# Patient Record
Sex: Male | Born: 1955 | Race: White | Hispanic: No | Marital: Married | Smoking: Never smoker
Health system: Southern US, Community
[De-identification: ages and names within clinical notes are randomized; demographics above are authoritative.]

## PROBLEM LIST (undated history)

## (undated) DIAGNOSIS — E785 Hyperlipidemia, unspecified: Secondary | ICD-10-CM

## (undated) DIAGNOSIS — H409 Unspecified glaucoma: Secondary | ICD-10-CM

## (undated) DIAGNOSIS — Z5189 Encounter for other specified aftercare: Secondary | ICD-10-CM

## (undated) DIAGNOSIS — Z9889 Other specified postprocedural states: Secondary | ICD-10-CM

## (undated) DIAGNOSIS — I10 Essential (primary) hypertension: Secondary | ICD-10-CM

## (undated) DIAGNOSIS — R112 Nausea with vomiting, unspecified: Secondary | ICD-10-CM

## (undated) DIAGNOSIS — M1990 Unspecified osteoarthritis, unspecified site: Secondary | ICD-10-CM

## (undated) DIAGNOSIS — E119 Type 2 diabetes mellitus without complications: Secondary | ICD-10-CM

## (undated) HISTORY — DX: Other specified postprocedural states: R11.2

## (undated) HISTORY — DX: Unspecified osteoarthritis, unspecified site: M19.90

## (undated) HISTORY — DX: Unspecified glaucoma: H40.9

## (undated) HISTORY — DX: Other specified postprocedural states: Z98.890

## (undated) HISTORY — PX: MANDIBLE FRACTURE SURGERY: SHX706

## (undated) HISTORY — DX: Type 2 diabetes mellitus without complications: E11.9

## (undated) HISTORY — PX: KNEE ARTHROSCOPY W/ ACL RECONSTRUCTION: SHX1858

## (undated) HISTORY — DX: Hyperlipidemia, unspecified: E78.5

## (undated) HISTORY — PX: TONSILECTOMY/ADENOIDECTOMY WITH MYRINGOTOMY: SHX6125

## (undated) HISTORY — PX: FRACTURE SURGERY: SHX138

## (undated) HISTORY — DX: Essential (primary) hypertension: I10

## (undated) HISTORY — DX: Encounter for other specified aftercare: Z51.89

---

## 2002-05-15 ENCOUNTER — Encounter: Payer: Self-pay | Admitting: Oral Surgery

## 2002-05-20 ENCOUNTER — Ambulatory Visit (HOSPITAL_COMMUNITY): Admission: RE | Admit: 2002-05-20 | Discharge: 2002-05-20 | Payer: Self-pay | Admitting: Oral Surgery

## 2002-08-27 ENCOUNTER — Ambulatory Visit (HOSPITAL_COMMUNITY): Admission: RE | Admit: 2002-08-27 | Discharge: 2002-08-27 | Payer: Self-pay | Admitting: Gastroenterology

## 2008-09-25 HISTORY — PX: COLONOSCOPY: SHX174

## 2009-03-22 ENCOUNTER — Ambulatory Visit: Payer: Self-pay | Admitting: Gastroenterology

## 2009-04-06 ENCOUNTER — Encounter: Payer: Self-pay | Admitting: Gastroenterology

## 2009-04-06 ENCOUNTER — Ambulatory Visit: Payer: Self-pay | Admitting: Gastroenterology

## 2009-04-08 ENCOUNTER — Encounter: Payer: Self-pay | Admitting: Gastroenterology

## 2011-02-10 NOTE — Op Note (Signed)
   NAME:  Chad Morton, Chad Morton                           ACCOUNT NO.:  [id number]   MEDICAL RECORD NO.:  [id number]                   PATIENT TYPE:  AMB   LOCATION:  ENDO                                 FACILITY:  [facility]   PHYSICIAN:  Anselmo Rod, M.D.               DATE OF BIRTH:  [birth year]   DATE OF PROCEDURE:  08/27/2002  DATE OF DISCHARGE:                                 OPERATIVE REPORT   PROCEDURE PERFORMED:  Colonoscopy.   ENDOSCOPIST:  Charna Elizabeth, M.D.   INSTRUMENT USED:  Olympus video colonoscope.   INDICATIONS FOR PROCEDURE:  Family history of colon cancer in a 55 year old  white male.  Rule out colonic polyps, masses, etc.   PREPROCEDURE PREPARATION:  Informed consent was procured from the patient.  The patient was fasted for eight hours prior to the procedure and prepped  with a bottle of magnesium citrate and a gallon of NuLytely the night prior  to the procedure.   PREPROCEDURE PHYSICAL:  The patient had stable vital signs.  Neck supple.  Chest clear to auscultation.  S1 and S2 regular.  Abdomen soft with normal  bowel sounds.   DESCRIPTION OF PROCEDURE:  The patient was placed in left lateral decubitus  position and sedated with 100 mg of Demerol and 10  mg of Versed  intravenously.  Once the patient was adequately sedated and maintained on  low flow oxygen and continuous cardiac monitoring, the Olympus video  colonoscope was advanced from the rectum to the cecum with slight  difficulty.  The patient had significant spasm in the colon.  There was  evidence of early left-sided diverticulosis.  No masses or polyps were seen.  The procedure was complete up to the cecum.  The appendicular orifice and  ileocecal valve were clearly visualized and photographed.   IMPRESSION:  1. Healthy-appearing colon except for left-sided diverticulosis.  2. No masses or polyps seen.   RECOMMENDATIONS:  1. A high fiber diet has been discussed with the patient in great detail and   brochures have been given to him for his     education.  Information on diverticulosis has been handed to him as well.  2. Repeat colorectal cancer screening is recommended in the next five years     unless the patient develops any abnormal symptoms in the interim.                                                  Anselmo Rod, M.D.    JNM/MEDQ  D:  08/27/2002  T:  08/27/2002  Job:  [id number]   cc:   Marjory Lies, M.D.  [address]  [place]  [place] [address]  Fax: [phone]

## 2011-02-10 NOTE — Op Note (Signed)
NAME:  Chad Morton, Chad Morton                           ACCOUNT NO.:  [id number]   MEDICAL RECORD NO.:  [id number]                   PATIENT TYPE:  OIB   LOCATION:  2899                                 FACILITY:  [facility]   PHYSICIAN:  Dionne Ano. Gwyneth Sprout., D.D.S.        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   PROCEDURE:  Bilateral sagittal split osteotomy of the mandible with  advancement and rigid fixation.   SURGEONS:  Dionne Ano. Manson Passey, D.D.S., and Lovena Le, D.D.S.   COMPLICATIONS:  None.   INDICATIONS:  This 55 year old male has completed preoperative orthodontic  treatment with Dr. Althea Grimmer to align the maxillary and mandibular teeth.  After a model analysis, cephalometric analysis, and clinical analysis, it  was noted that he was significantly mandibular-deficient with a full class  II malocclusion, and this malocclusion could not be corrected with  orthodontic treatment alone.  A treatment plan was formulated for advancing  his mandible hiatus 5 mm on the right side and 6 mm on the left side to  achieve a class I occlusion, and a surgical splint was constructed from the  surgical models.   DESCRIPTION OF PROCEDURE:  The patient brought to the operating suite,  placed in the supine position on the operating table.  After satisfactory  nasotracheal anesthesia was achieved, a moist sponge was placed in the  oropharynx and the mouth was prepped with Betadine scrub and the face was  painted with Betadine.  The patient was draped with four towels, split  sheet, and a head sheet.   Using 0.5% Marcaine with 1:200,000 epinephrine, bilateral inferior alveolar  and lingual nerve blocks were achieved for hemostasis and  postoperative  comfort.  Then using an electrocautery in the cutting mode, an incision was  made along the anterior ramus of the right ramus of the mandible from the  midportion anteriorly and inferiorly to the second molar tooth area.  The  incision was carried through the mucosa, submucosa, buccinator muscle,  temporalis muscle, periosteum, down to bone.  A subperiosteal dissection was  carried out along the anterior border of the mandible and the temporalis  attachment was stripped superiorly to the coronoid process.  Dissection as  carried out subperiosteally on the medial aspect of the mandible,  identifying the lingula, and then using the rotary osteotome a horizontal  bone cut was made just superior to the lingula through the medial cortical  plate and the internal oblique ridge.  Then using a 701 bur, a bone cut was  made in the sagittal plane between the external and internal oblique ridges  anteriorly and inferiorly to the second molar tooth area.  The dissection  was carried out from the inferior border and a retractor was placed along  the inferior border of the mandible, and a bone cut was made from the  inferior border to the lateral cortical plates to connect with the inferior  portion of the sagittal cut.  Then using a mallet and chisel, taking great  care to prevent injury to the inferior alveolar nerve, the sagittal plane  was dissected between the external and internal oblique ridges with  progressively larger chisel to start the sagittal split and then using  splitting instruments, the sagittal split was completed in the traditional  manner.  The inferior alveolar neurovascular bundle was noted to be  contained completely in the distal segment.  A J-stripper was used to strip  portions of the digastric muscle from the inferior border and the medial  pterygoid muscle internally, thus bringing up the mandible for advancement.  A half pack was placed, and a similar sagittal  split osteotomy was completed  on the left ramus of the mandible with the inferior alveolar neurovascular  bundle intact and contained completely in the distal segment after  dissection from the proximal segment.   The mandible was now freely movable and could be advanced 6 mm on the left  and 5 mm on the right and a surgical splint guide was placed intra-  occlusally, and the patient was interdigitated into the occlusal splint and  the teeth were placed in the intermaxillary fixation.  With the teeth in  their new advanced position, the proximal segments of the mandible were then  positioned with the condyle in the most posterior superior portion of the  glenoid fossa and clamped to the distal segment, and two KLS titanium 2 mm  diameter screws were drilled in a noncompression manner for rigid fixation  through the ramus of the mandible on each side.  This fixated the proximal  and distal segments with the condyles in their proper anatomic position and  the mandible in a class I position.  This was tested by releasing the  intermaxillary fixation, and the mandible could be rotated easily into the  new class I occlusion and was exactly as planned on the surgical models.   Both incision areas were then thoroughly irrigated with normal saline and  closed with a running 3-0 Vicryl suture.   The oral cavity was thoroughly irrigated.  The throat pack was removed, and  the patient was placed in light intermaxillary elastic fixation and awakened  in the operating room.   The patient tolerated the surgery and the anesthesia without complications  with an estimated blood loss of 20 cc.                                                 Dionne Ano. Gwyneth Sprout., D.D.S.    WMB/MEDQ  D:  05/20/2002  T:  05/22/2002  Job:  [id number]

## 2014-04-07 ENCOUNTER — Encounter: Payer: Self-pay | Admitting: Gastroenterology

## 2015-10-08 MED FILL — GLIMEPIRIDE 4 MG TABLET: 4 | 90 days supply | Qty: 180 | Fill #1

## 2015-12-10 MED FILL — PRAVASTATIN NA 40 MG TAB: 40 | 90 days supply | Qty: 90 | Fill #0

## 2015-12-10 MED FILL — BENAZEPRIL HCL 40 MG TABLET: 40 | 90 days supply | Qty: 90 | Fill #0

## 2015-12-10 MED FILL — METFORMIN HCL ER 500 MG TAB: 500 | 90 days supply | Qty: 360 | Fill #0

## 2016-05-26 DIAGNOSIS — E119 Type 2 diabetes mellitus without complications: Secondary | ICD-10-CM | POA: Diagnosis not present

## 2016-05-26 DIAGNOSIS — R030 Elevated blood-pressure reading, without diagnosis of hypertension: Secondary | ICD-10-CM | POA: Diagnosis not present

## 2016-05-26 DIAGNOSIS — E7800 Pure hypercholesterolemia, unspecified: Secondary | ICD-10-CM | POA: Diagnosis not present

## 2016-05-30 DIAGNOSIS — E785 Hyperlipidemia, unspecified: Secondary | ICD-10-CM | POA: Diagnosis not present

## 2016-05-30 DIAGNOSIS — E119 Type 2 diabetes mellitus without complications: Secondary | ICD-10-CM | POA: Diagnosis not present

## 2016-05-30 DIAGNOSIS — I10 Essential (primary) hypertension: Secondary | ICD-10-CM | POA: Diagnosis not present

## 2016-05-30 MED FILL — METFORMIN HCL ER 500 MG TAB: 500 | 90 days supply | Qty: 360 | Fill #0

## 2016-05-30 MED FILL — PRAVASTATIN NA 40 MG TAB: 40 | 90 days supply | Qty: 90 | Fill #0

## 2016-05-30 MED FILL — BENAZEPRIL HCL 40 MG TABLET: 40 | 90 days supply | Qty: 90 | Fill #0

## 2016-06-02 MED FILL — GLIMEPIRIDE 4 MG TABLET: 4 | 90 days supply | Qty: 90 | Fill #0

## 2016-08-28 MED FILL — PRAVASTATIN NA 40 MG TAB: 40 | 90 days supply | Qty: 90 | Fill #1

## 2016-08-28 MED FILL — METFORMIN HCL ER 500 MG TAB: 500 | 90 days supply | Qty: 360 | Fill #1

## 2016-08-28 MED FILL — BENAZEPRIL HCL 40 MG TABLET: 40 | 90 days supply | Qty: 90 | Fill #1

## 2016-08-28 MED FILL — GLIMEPIRIDE 4 MG TABLET: 4 | 90 days supply | Qty: 90 | Fill #1

## 2016-11-24 DIAGNOSIS — E119 Type 2 diabetes mellitus without complications: Secondary | ICD-10-CM | POA: Diagnosis not present

## 2016-11-24 DIAGNOSIS — Z125 Encounter for screening for malignant neoplasm of prostate: Secondary | ICD-10-CM | POA: Diagnosis not present

## 2016-11-24 DIAGNOSIS — I10 Essential (primary) hypertension: Secondary | ICD-10-CM | POA: Diagnosis not present

## 2016-11-24 DIAGNOSIS — E785 Hyperlipidemia, unspecified: Secondary | ICD-10-CM | POA: Diagnosis not present

## 2016-11-29 DIAGNOSIS — E119 Type 2 diabetes mellitus without complications: Secondary | ICD-10-CM | POA: Diagnosis not present

## 2016-11-29 DIAGNOSIS — E785 Hyperlipidemia, unspecified: Secondary | ICD-10-CM | POA: Diagnosis not present

## 2016-11-29 DIAGNOSIS — I10 Essential (primary) hypertension: Secondary | ICD-10-CM | POA: Diagnosis not present

## 2016-11-29 MED FILL — METFORMIN HCL ER 500 MG TAB: 500 | 90 days supply | Qty: 360 | Fill #0

## 2016-11-29 MED FILL — GLIMEPIRIDE 4 MG TABLET: 4 | 90 days supply | Qty: 90 | Fill #0

## 2016-11-29 MED FILL — PRAVASTATIN NA 40 MG TAB: 40 | 90 days supply | Qty: 90 | Fill #0

## 2016-11-29 MED FILL — BENAZEPRIL HCL 40 MG TABLET: 40 | 90 days supply | Qty: 90 | Fill #0

## 2017-01-18 DIAGNOSIS — E119 Type 2 diabetes mellitus without complications: Secondary | ICD-10-CM | POA: Diagnosis not present

## 2017-01-18 DIAGNOSIS — H401131 Primary open-angle glaucoma, bilateral, mild stage: Secondary | ICD-10-CM | POA: Diagnosis not present

## 2017-01-18 MED FILL — LATANOPROST 0.005% EYE DRP: 0.005 | 75 days supply | Qty: 8 | Fill #0

## 2017-04-24 MED FILL — BENAZEPRIL HCL 40 MG TABLET: 40 | 90 days supply | Qty: 90 | Fill #1

## 2017-04-24 MED FILL — PRAVASTATIN NA 40 MG TAB: 40 | 90 days supply | Qty: 90 | Fill #1

## 2017-04-24 MED FILL — GLIMEPIRIDE 4 MG TABLET: 4 | 90 days supply | Qty: 90 | Fill #1

## 2017-04-25 MED FILL — LATANOPROST 0.005% EYE DRP: 0.005 | 75 days supply | Qty: 8 | Fill #1

## 2017-05-30 DIAGNOSIS — R030 Elevated blood-pressure reading, without diagnosis of hypertension: Secondary | ICD-10-CM | POA: Insufficient documentation

## 2017-05-30 DIAGNOSIS — E7800 Pure hypercholesterolemia, unspecified: Secondary | ICD-10-CM | POA: Insufficient documentation

## 2017-05-30 DIAGNOSIS — E119 Type 2 diabetes mellitus without complications: Secondary | ICD-10-CM | POA: Insufficient documentation

## 2017-07-19 DIAGNOSIS — H401132 Primary open-angle glaucoma, bilateral, moderate stage: Secondary | ICD-10-CM | POA: Diagnosis not present

## 2017-07-19 MED FILL — COMBIGAN EYE DROPS: 0.2-0.5 | 30 days supply | Qty: 5 | Fill #0

## 2017-07-25 MED FILL — BENAZEPRIL HCL 40 MG TABLET: 40 | 90 days supply | Qty: 90 | Fill #0

## 2017-07-30 DIAGNOSIS — E785 Hyperlipidemia, unspecified: Secondary | ICD-10-CM | POA: Diagnosis not present

## 2017-07-30 DIAGNOSIS — E1169 Type 2 diabetes mellitus with other specified complication: Secondary | ICD-10-CM | POA: Diagnosis not present

## 2017-07-30 DIAGNOSIS — I10 Essential (primary) hypertension: Secondary | ICD-10-CM | POA: Diagnosis not present

## 2017-07-30 LAB — LIPID PANEL
CHOLESTEROL: 163 (ref 0–200)
HDL: 65 (ref 35–70)
LDL CALC: 98
Triglycerides: 102 (ref 40–160)

## 2017-07-30 LAB — HEMOGLOBIN A1C: Hemoglobin A1C: 7.1

## 2017-08-01 DIAGNOSIS — E119 Type 2 diabetes mellitus without complications: Secondary | ICD-10-CM | POA: Diagnosis not present

## 2017-08-01 DIAGNOSIS — E7800 Pure hypercholesterolemia, unspecified: Secondary | ICD-10-CM | POA: Diagnosis not present

## 2017-08-01 DIAGNOSIS — I10 Essential (primary) hypertension: Secondary | ICD-10-CM | POA: Diagnosis not present

## 2017-08-01 MED FILL — METFORMIN HCL ER 500 MG TAB: 500 | 90 days supply | Qty: 360 | Fill #0

## 2017-08-01 MED FILL — GLIMEPIRIDE 4 MG TABLET: 4 | 90 days supply | Qty: 135 | Fill #0

## 2017-08-01 MED FILL — PRAVASTATIN NA 40 MG TAB: 40 | 90 days supply | Qty: 90 | Fill #0

## 2017-08-20 DIAGNOSIS — H401132 Primary open-angle glaucoma, bilateral, moderate stage: Secondary | ICD-10-CM | POA: Diagnosis not present

## 2017-09-14 MED FILL — COMBIGAN EYE DROPS: 0.2-0.5 | 30 days supply | Qty: 5 | Fill #1

## 2017-10-09 ENCOUNTER — Ambulatory Visit (INDEPENDENT_AMBULATORY_CARE_PROVIDER_SITE_OTHER): Payer: No Typology Code available for payment source | Admitting: Family Medicine

## 2017-10-09 ENCOUNTER — Encounter: Payer: Self-pay | Admitting: Family Medicine

## 2017-10-09 VITALS — BP 130/88 | HR 79 | Temp 98.1°F | Ht 68.5 in | Wt 197.4 lb

## 2017-10-09 DIAGNOSIS — E1169 Type 2 diabetes mellitus with other specified complication: Secondary | ICD-10-CM | POA: Diagnosis not present

## 2017-10-09 DIAGNOSIS — E119 Type 2 diabetes mellitus without complications: Secondary | ICD-10-CM | POA: Diagnosis not present

## 2017-10-09 DIAGNOSIS — Z23 Encounter for immunization: Secondary | ICD-10-CM | POA: Diagnosis not present

## 2017-10-09 DIAGNOSIS — Z800 Family history of malignant neoplasm of digestive organs: Secondary | ICD-10-CM | POA: Diagnosis not present

## 2017-10-09 DIAGNOSIS — H409 Unspecified glaucoma: Secondary | ICD-10-CM | POA: Diagnosis not present

## 2017-10-09 DIAGNOSIS — I10 Essential (primary) hypertension: Secondary | ICD-10-CM | POA: Diagnosis not present

## 2017-10-09 DIAGNOSIS — T50A95A Adverse effect of other bacterial vaccines, initial encounter: Secondary | ICD-10-CM | POA: Diagnosis not present

## 2017-10-09 DIAGNOSIS — E785 Hyperlipidemia, unspecified: Secondary | ICD-10-CM

## 2017-10-09 NOTE — Patient Instructions (Addendum)
We will call you within a week or two about your referral to GI for colonoscopy due to family hisotry. If you do not hear within 3 weeks, give us a call.   Sign release of information at the check out desk for records from your eye doctor  Pneumovax 23 today- will repeat in 5 years, in 4 years likely give prevnar 13- 3 total vaccines for pneumonia in your lifetime unless guidelines change  I like your idea of trimming 10-15 lbs. Blood pressure is high normal and on bottom # and would be nice to see that come down some.   No changes today othrewise

## 2017-10-09 NOTE — Addendum Note (Signed)
Addended by: Heide SparkSOUTHERN HIZER, JAMIE M on: 10/09/2017 10:36 AM   Modules accepted: Orders

## 2017-10-09 NOTE — Assessment & Plan Note (Signed)
S: mild poorly controlled with last a1c 7.1 in November 2018. On amaryl 6mg  daily (up from 4mg  when a1c was 7.1) plus metformin 500mg  XR 2 tablets once a day (instructions from [facility] were 2 tablets twice a day but he is only taking once a day) A/P: continue current meds- return in 5 months- get a1c, cmp likely.

## 2017-10-09 NOTE — Progress Notes (Addendum)
Phone: [phone]  Subjective:  Patient presents today to establish care.  Prior patient of Rueben Bash, [place] with [facility] (on epic- last seen 08/01/17). Chief complaint-noted.   See problem oriented charting  The following were reviewed and entered/updated in epic: Past Medical History:  Diagnosis Date  . Diabetes mellitus without complication (HCC)   . Glaucoma    [facility]  . Hyperlipidemia   . Hypertension    Patient Active Problem List   Diagnosis Date Noted  . 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 Hypertension, essential 10/09/2017    Priority: Medium  . Hyperlipidemia associated with type 2 diabetes mellitus (HCC) 10/09/2017    Priority: Medium  . Glaucoma     Priority: Medium  . Family history of colon cancer 10/09/2017    Priority: Low  . Diabetes mellitus type II, controlled (HCC) 10/09/2017   Past Surgical History:  Procedure Laterality Date  . KNEE ARTHROSCOPY W/ ACL RECONSTRUCTION Bilateral    both knees x1  . MANDIBLE FRACTURE SURGERY Bilateral   . TONSILECTOMY/ADENOIDECTOMY WITH MYRINGOTOMY     childhood    Family History  Problem Relation Age of Onset  . Myasthenia gravis Mother        complications from this  . Colon cancer Father        diagnosed 41- died within a year of diagnosis  . Diabetes Brother   . Hyperlipidemia Brother     Medications- reviewed and updated Current Outpatient Medications  Medication Sig Dispense Refill  . aspirin EC 81 MG tablet Take 1 tablet by mouth daily.    . benazepril (LOTENSIN) 40 MG tablet Take 1 tablet by mouth daily.    . COMBIGAN 0.2-0.5 % ophthalmic solution Place 1 drop into both eyes 2 (two) times daily.  3  . glimepiride (AMARYL) 4 MG tablet Take 1.5 tablets by mouth daily.    . metFORMIN (GLUCOPHAGE-XR) 500 MG 24 hr tablet Take 2 tablets by mouth daily.    . pravastatin (PRAVACHOL) 40 MG tablet Take 1 tablet by mouth daily.     No  current facility-administered medications for this visit.     Allergies-reviewed and updated Allergies  Allergen Reactions  . Diphtheria Toxoid Swelling    Social History   Socioeconomic History  . Marital status: Married    Spouse name: None  . Number of children: None  . Years of education: None  . Highest education level: None  Social Needs  . Financial resource strain: None  . Food insecurity - worry: None  . Food insecurity - inability: None  . Transportation needs - medical: None  . Transportation needs - non-medical: None  Occupational History  . None  Tobacco Use  . Smoking status: Never Smoker  . Smokeless tobacco: Never Used  Substance and Sexual Activity  . Alcohol use: Yes    Alcohol/week: 8.4 oz    Types: 14 Standard drinks or equivalent per week  . Drug use: No  . Sexual activity: Yes  Other Topics Concern  . None  Social History Narrative   Married. 3 kids. 4 soon to be 5 grandkids (first girl) in 2018.       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 Daughter works for [employer]- geography major      Hobbies: time with family, beach, football (season tickets to [employer])    ROS--Full ROS was completed Review of Systems  Constitutional: Negative for chills and  fever.  HENT: Negative for ear discharge and ear pain.   Eyes: Negative for blurred vision and double vision.  Respiratory: Negative for cough and shortness of breath.   Cardiovascular: Negative for chest pain and palpitations.  Gastrointestinal: Negative for abdominal pain and vomiting.  Genitourinary: Negative for dysuria and urgency.  Musculoskeletal: Negative for myalgias and neck pain.  Skin: Negative for itching and rash.  Neurological: Negative for dizziness and tingling.  Endo/Heme/Allergies: Negative for polydipsia. Does not bruise/bleed easily.  Psychiatric/Behavioral: Negative for hallucinations and substance abuse.   Objective: BP 130/88   Pulse 79   Temp 98.1 F  (36.7 C) (Oral)   Ht 5' 8.5" (1.74 m)   Wt 197 lb 6.4 oz (89.5 kg)   SpO2 98%   BMI 29.58 kg/m  Gen: NAD, resting comfortably HEENT: Mucous membranes are moist. Oropharynx normal. TM normal. Eyes: sclera and lids normal, PERRLA Neck: no thyromegaly, no cervical lymphadenopathy CV: RRR no murmurs rubs or gallops Lungs: CTAB no crackles, wheeze, rhonchi Abdomen: soft/nontender/nondistended/normal bowel sounds. No rebound or guarding. overweight Ext: no edema Skin: warm, dry Neuro: 5/5 strength in upper and lower extremities, normal gait, normal reflexes  Diabetic Foot Exam - Simple   Simple Foot Form Diabetic Foot exam was performed with the following findings:  Yes 10/09/2017 10:14 AM  Visual Inspection No deformities, no ulcerations, no other skin breakdown bilaterally:  Yes Sensation Testing Intact to touch and monofilament testing bilaterally:  Yes Pulse Check Posterior Tibialis and Dorsalis pulse intact bilaterally:  Yes Comments    Assessment/Plan:  Health Maintenance Due  . Hepatitis C Screening - donated blood to [facility] within 10 years Aug 14, 1956  . PNEUMOCOCCAL POLYSACCHARIDE VACCINE (1)- today 04/18/1958  . FOOT EXAM - today 04/18/1966  . OPHTHALMOLOGY EXAM - request today 04/18/1966  . HIV Screening - donated blood to [facility] within 10 years 04/19/1971  . TETANUS/TDAP - not a candidate due to diphtheria reaction in the past 04/19/1975   See avs as well  Hypertension, essential S: controlled on benazepril 40mg .  BP Readings from Last 3 Encounters:  10/09/17 130/88  A/P: We discussed blood pressure goal of <140/90. Continue current meds- will refill when needed- he asked us to not send any meds in today as just had them filled  Hyperlipidemia associated with type 2 diabetes mellitus (HCC) S:  controlled on pravastatin 40mg  with LDL 98. Total was 163, trig 102, HDL 65 on 07/30/17- will abstract in. No myalgias.   A/P: well controlled- refill medicine under  my name when needed  Diabetes mellitus type II, controlled (HCC) S: mild poorly controlled with last a1c 7.1 in November 2018. On amaryl 6mg  daily (up from 4mg  when a1c was 7.1) plus metformin 500mg  XR 2 tablets once a day (instructions from [facility] were 2 tablets twice a day but he is only taking once a day) A/P: continue current meds- return in 5 months- get a1c, cmp likely.   Return in about 5 months (around 03/09/2018) for follow up- or sooner if needed.   Need for pneumococcal vaccine - Plan: Pneumococcal polysaccharide vaccine 23-valent greater than or equal to 2yo subcutaneous/IM   Return precautions advised. Tana ConchStephen Brailon Don, MD

## 2017-10-09 NOTE — Assessment & Plan Note (Signed)
S: controlled on benazepril 40mg .  BP Readings from Last 3 Encounters:  10/09/17 130/88  A/P: We discussed blood pressure goal of <140/90. Continue current meds- will refill when needed- he asked us to not send any meds in today as just had them filled

## 2017-10-09 NOTE — Assessment & Plan Note (Signed)
S:  controlled on pravastatin 40mg  with LDL 98. Total was 163, trig 102, HDL 65 on 07/30/17- will abstract in. No myalgias.   A/P: well controlled- refill medicine under my name when needed

## 2017-10-22 MED FILL — BENAZEPRIL HCL 40 MG TABLET: 40 | 90 days supply | Qty: 90 | Fill #1

## 2017-10-23 MED FILL — COMBIGAN EYE DROPS: 0.2-0.5 | 30 days supply | Qty: 5 | Fill #2

## 2017-10-25 MED FILL — METFORMIN HCL ER 500 MG TAB: 500 | 90 days supply | Qty: 360 | Fill #1

## 2017-10-25 MED FILL — PRAVASTATIN NA 40 MG TAB: 40 | 90 days supply | Qty: 90 | Fill #1

## 2017-10-25 MED FILL — GLIMEPIRIDE 4 MG TABLET: 4 | 90 days supply | Qty: 135 | Fill #1

## 2017-11-09 ENCOUNTER — Encounter: Payer: Self-pay | Admitting: Gastroenterology

## 2017-11-15 ENCOUNTER — Telehealth: Payer: Self-pay | Admitting: Family Medicine

## 2017-11-15 NOTE — Telephone Encounter (Signed)
See note and please reach out to patient

## 2017-11-15 NOTE — Telephone Encounter (Signed)
Yes thanks- referral to Dr. Berline Choughigby is fine

## 2017-11-15 NOTE — Telephone Encounter (Signed)
Called and spoke with patient and let him know that the referral is in his chart. Gastroenterology will also have a copy of the referral to provide for insurance purposes.  Patient also complains of right knee pain. He was asking about a referral. I discussed the differences between Sports Med & Ortho and he is requesting to see Sports Medicine for Right Knee Pain. Is it ok to place this referral?

## 2017-11-15 NOTE — Telephone Encounter (Signed)
Copied from CRM [id number]. Topic: Quick Communication - See Telephone Encounter >> Nov 15, 2017  1:21 PM Rudi CocoLathan, Kamy Poinsett M, [place] wrote: CRM for notification. See Telephone encounter for:   11/15/17. Pt. Calling to ask about his referral that was sent to Dr. Christella HartiganJacobs dealing with Gi. Pt. York SpanielSaid that someone needs to contact his insurance to let them know that Dr. Durene CalHunter referred him to this provider. Pt. Can be reached at [phone]

## 2017-11-16 ENCOUNTER — Other Ambulatory Visit: Payer: Self-pay

## 2017-11-16 DIAGNOSIS — M25561 Pain in right knee: Secondary | ICD-10-CM

## 2017-11-16 NOTE — Telephone Encounter (Signed)
Referral placed as requested.

## 2017-11-19 NOTE — Telephone Encounter (Signed)
Pt has been scheduled with Dr. Berline Choughigby

## 2017-11-22 ENCOUNTER — Encounter: Payer: Self-pay | Admitting: Sports Medicine

## 2017-11-22 ENCOUNTER — Ambulatory Visit (INDEPENDENT_AMBULATORY_CARE_PROVIDER_SITE_OTHER): Payer: No Typology Code available for payment source | Admitting: Sports Medicine

## 2017-11-22 ENCOUNTER — Ambulatory Visit: Payer: Self-pay

## 2017-11-22 VITALS — BP 138/88 | HR 82 | Ht 68.5 in | Wt 191.4 lb

## 2017-11-22 DIAGNOSIS — M25511 Pain in right shoulder: Secondary | ICD-10-CM

## 2017-11-22 DIAGNOSIS — E119 Type 2 diabetes mellitus without complications: Secondary | ICD-10-CM

## 2017-11-22 DIAGNOSIS — M25561 Pain in right knee: Secondary | ICD-10-CM | POA: Diagnosis not present

## 2017-11-22 DIAGNOSIS — Z9889 Other specified postprocedural states: Secondary | ICD-10-CM

## 2017-11-22 NOTE — Progress Notes (Signed)
Chad FellsMichael D. Delorise Shinerigby, DO  [facility] [place] [phone]  Waylan BogaGill R Depner - 62 y.o. male MRN [id number]  Date of birth: 02-01-1956  Visit Date: 11/22/2017  PCP: Shelva MajesticHunter, Stephen O, MD   Referred by: Shelva MajesticHunter, Stephen O, MD   Scribe for today's visit: Christoper FabianMolly Weber, LAT, ATC     SUBJECTIVE:  Skeet SimmerGill R Cleckler is here for No chief complaint on file. Marland Kitchen.  Referred by: Dr. Durene CalHunter His R ant-med knee pain symptoms INITIALLY: Began about 3 weeks w/ no known MOI.  Has a hx of B ACL reconstructions. Described as 4/10 moderate pain at rest and 7/10 at it's worst, nonradiating Worsened with weight-bearing Improved with Naproxen Additional associated symptoms include: no reported popping or clicking; moderate swelling per pt report    At this time symptoms are improving compared to onset  He has been taking Naproxen (3 pills/day; 2 in the AM and 1 in PM)   ROS Denies night time disturbances. Denies fevers, chills, or night sweats. Denies unexplained weight loss. Denies personal history of cancer. Denies changes in bowel or bladder habits. Denies recent unreported falls. Denies new or worsening dyspnea or wheezing. Denies headaches or dizziness.  Denies numbness, tingling or weakness  In the extremities.  Denies dizziness or presyncopal episodes Denies lower extremity edema    HISTORY & PERTINENT PRIOR DATA:  Prior History reviewed and updated per electronic medical record.  Significant/pertinent history, findings, studies include:  reports that he has never smoked. He has never used smokeless tobacco. Recent Labs    07/30/17  HGBA1C 7.1   No specialty comments available. No problems updated.  OBJECTIVE:  VS:  HT:5' 8.5" (174 cm)   WT:191 lb 6.4 oz (86.8 kg)  BMI:28.68    BP:138/88  HR:82bpm  TEMP: ( )  RESP:96 %   PHYSICAL EXAM: Constitutional: WDWN, Non-toxic appearing. Psychiatric: Alert & appropriately interactive.  Not depressed or  anxious appearing. Respiratory: No increased work of breathing.  Trachea Midline Eyes: Pupils are equal.  EOM intact without nystagmus.  No scleral icterus  Vascular Exam: warm to touch no edema  lower extremity neuro exam: unremarkable normal strength normal sensation normal reflexes  MSK Exam: Right knee is overall well aligned with small amount of osteoarthritic bossing.  No significant laxity with anterior and posterior drawer.  He does have a slight varus deformity.  Positive patellar grind.  Pain with McMurray's.  Right shoulder has a small degree of scapular dyskinesis but this is minimal.  Overhead range of motion is full.  He has intrinsic rotator cuff strength that is intact.    ASSESSMENT & PLAN:   1. Controlled type 2 diabetes mellitus without complication, without long-term current use of insulin (HCC)   2. Arthralgia of right knee   3. S/P ACL repair   4. Pain in joint of right shoulder     PLAN: Injection performed today.  We will plan to follow-up with him in 10 weeks to ensure clinical resolution and consider repeating injections at that time.  Any lack of improvement with the shoulder we will plan to have him begin on therapeutic exercises emphasize posterior chain exercises for him today.  Follow-up: Return in about 10 weeks (around 01/31/2018).      Please see additional documentation for Objective, Assessment and Plan sections. Pertinent additional documentation may be included in corresponding procedure notes, imaging studies, problem based documentation and patient instructions. Please see these sections of the encounter for  additional information regarding this visit.  CMA/ATC served as Neurosurgeon during this visit. History, Physical, and Plan performed by medical provider. Documentation and orders reviewed and attested to.      Andrena Mews, DO    [facility] Physician

## 2017-11-22 NOTE — Patient Instructions (Signed)

## 2017-11-22 NOTE — Procedures (Signed)
PROCEDURE NOTE:  Ultrasound Guided: Injection: right knee Images were obtained and interpreted by myself, Gaspar BiddingMichael Rigby, DO  Images have been saved and stored to PACS system. Images obtained on: GE S7 Ultrasound machine  ULTRASOUND FINDINGS:  Small amount of generalized synovitis without significant effusion.  DESCRIPTION OF PROCEDURE:  The patient's clinical condition is marked by substantial pain and/or significant functional disability. Other conservative therapy has not provided relief, is contraindicated, or not appropriate. There is a reasonable likelihood that injection will significantly improve the patient's pain and/or functional impairment.  After discussing the risks, benefits and expected outcomes of the injection and all questions were reviewed and answered, the patient wished to undergo the above named procedure. Verbal consent was obtained.  The ultrasound was used to identify the target structure and adjacent neurovascular structures. The skin was then prepped in sterile fashion and the target structure was injected under direct visualization using sterile technique as below:  PREP: Alcohol, Ethel Chloride APPROACH: superiolateral, single injection, 25g 1.5in.  INJECTATE: 3cc: 0.5% marcaine, 1cc: 40mg /mL DepoMedrol   ASPIRATE: None   DRESSING: Band-Aid &: 4-inch Ace Wrap  Post procedural instructions including recommending icing and warning signs for infection were reviewed.   This procedure was well tolerated and there were no complications.   IMPRESSION: Succesful Ultrasound Guided: Injection

## 2017-12-05 ENCOUNTER — Other Ambulatory Visit: Payer: Self-pay

## 2017-12-05 ENCOUNTER — Encounter: Payer: Self-pay | Admitting: Sports Medicine

## 2017-12-05 DIAGNOSIS — M25561 Pain in right knee: Secondary | ICD-10-CM

## 2017-12-05 NOTE — Telephone Encounter (Signed)
Okay to order MRI of the knee.  Rule out meniscal tear.  H

## 2017-12-10 ENCOUNTER — Encounter: Payer: Self-pay | Admitting: Sports Medicine

## 2017-12-10 ENCOUNTER — Ambulatory Visit: Payer: No Typology Code available for payment source | Admitting: Sports Medicine

## 2017-12-10 ENCOUNTER — Ambulatory Visit: Payer: Self-pay

## 2017-12-10 ENCOUNTER — Ambulatory Visit (INDEPENDENT_AMBULATORY_CARE_PROVIDER_SITE_OTHER): Payer: No Typology Code available for payment source

## 2017-12-10 VITALS — BP 130/88 | HR 107 | Ht 68.5 in | Wt 188.4 lb

## 2017-12-10 DIAGNOSIS — M25511 Pain in right shoulder: Secondary | ICD-10-CM | POA: Diagnosis not present

## 2017-12-10 DIAGNOSIS — M75101 Unspecified rotator cuff tear or rupture of right shoulder, not specified as traumatic: Secondary | ICD-10-CM

## 2017-12-10 MED ORDER — NITROGLYCERIN 0.2 MG/HR TD PT24: MEDICATED_PATCH | TRANSDERMAL | 1 refills | Status: DC

## 2017-12-10 MED FILL — NITROGLYCERIN 0.2 MG/HR PTC: 0.2 | 30 days supply | Qty: 15 | Fill #0

## 2017-12-10 NOTE — Patient Instructions (Signed)

## 2017-12-10 NOTE — Progress Notes (Signed)
    Chad Morton R Mossa - 62 y.o. male MRN [id number]  Date of birth: [birth year]  Visit Date: 12/10/2017  PCP: Shelva MajesticHunter, Stephen O, MD   Referred by: Shelva MajesticHunter, Stephen O, MD  Please see additional documentation for HPI, review of systems.  HISTORY & PERTINENT PRIOR DATA:  Prior History reviewed and updated per electronic medical record.  Significant/pertinent history, findings, studies include:  reports that he has never smoked. He has never used smokeless tobacco. Recent Labs    07/30/17  HGBA1C 7.1   No specialty comments available. No problems updated.  OBJECTIVE:  VS:  HT:5' 8.5" (174 cm)   WT:188 lb 6.4 oz (85.5 kg)  BMI:28.23    BP:130/88  HR:(Abnormal) 107bpm  TEMP: ( )  RESP:97 %   PHYSICAL EXAM: Constitutional: WDWN, Non-toxic appearing. Psychiatric: Alert & appropriately interactive.  Not depressed or anxious appearing. Respiratory: No increased work of breathing.  Trachea Midline Eyes: Pupils are equal.  EOM intact without nystagmus.  No scleral icterus  VASCULAR: warm to touch Radial Pulses: normal upper extremity neuro exam: unremarkable normal strength normal sensation normal reflexes  Right Shoulder Exam: Normal alignment & Contours Skin: No overlying erythema/ecchymosis Neck: normal range of motion and supple Axial loading and circumduction produces: No pain or crepitation  Non tender to palpation over: Bony Landmarks TTP over: none Drop arm test: negative Hawkins: normal, no pain  Neers: normal, no pain   Internal Rotation:  ROM: Normal with no pain Strength: Normal External Rotation:  ROM: Normal with no pain Strength: Normal  Empty can: normal, no pain Strength: Normal Speeds: normal, no pain Strength: Normal O'Briens: normal, no pain Strength: Normal       ASSESSMENT & PLAN:   1. Right shoulder pain, unspecified chronicity   2. Pain in joint of right shoulder   3. Rotator cuff syndrome of right shoulder     PLAN: Discussed options with  the patient today including biologic treatment with topical nitroglycerin. Patient has no contraindications & understands the risks, benefits and intentions of treatment. Emphasized the importance of rotating sites as well as appropriate and expected adverse reactions including orthostasis, headache, adhesive sensitivity.  Begin with 1/4 patch to the affected area. Okay to titrate to half a patch as tolerated.  Discussed the foundation of treatment for this condition is physical therapy and/or daily (5-6 days/week) therapeutic exercises, focusing on core strengthening, coordination, neuromuscular control/reeducation.  Therapeutic exercises prescribed per procedure note.   Follow-up: As scheduled for his knee issues.  We will plan to check on his shoulder at that time.

## 2017-12-10 NOTE — Procedures (Signed)
LIMITED MSK ULTRASOUND OF Right shoulder Images were obtained and interpreted by myself, Gaspar BiddingMichael Rigby, DO  Images have been saved and stored to PACS system. Images obtained on: GE S7 Ultrasound machine  FINDINGS:  Biceps Tendon: Small amount of hypoechoic change but otherwise normal. Pec Major Insertion: Normal Subscapularis Tendon: Normal Supraspinatus Tendon: Small bursal layer.  Hypoechoic change interstitially without evidence of significant tearing or retraction of the suggestion of possible interstitial split..  Small amount of tendinopathy.   Infraspinatus/Teres Minor Tendon: Normal AC Joint: Mild AC joint arthropathy JOINT: No significant GH spurring appreciated  LABRUM: Not evaluated    IMPRESSION:  1. Small subacromial bursitis with supraspinatus and biceps tendon tendinopathy.

## 2017-12-10 NOTE — Progress Notes (Signed)
  Chad Morton - 62 y.o. male MRN [id number]  Date of birth: July 15, 1956  Scribe for today's visit: Stevenson ClinchBrandy Coleman, CMA     SUBJECTIVE:  Chad Morton is here for right shoulder pain  His R shoulder pain symptoms INITIALLY: Began about 3 months ago and MOI is unknown. No past injury to the shoulder.  Described as moderate-severe when flaring up (8/10) aching and stabbing, nonradiating Worsened with lying down at night, after doing home exercises. Pain is worse first thing in the morning Improved with movement Additional associated symptoms include: Pain is mostly anterior but he does feel it all around the shoulder. He denies clicking or popping. He reports decreased ROM during flare-up. He denies swelling/tightness. No neck pain.     At this time symptoms show no change compared to onset, possibly a little worse at times.  He has been taking Naproxen with minimal relief. He feels like a hot shower gets the shoulder moving a little faster/loosened up.   No recent XR of the shoulder or neck.    ROS Reports night time disturbances. Denies fevers, chills, or night sweats. Denies unexplained weight loss. Denies personal history of cancer. Denies changes in bowel or bladder habits. Reports recent unreported falls, fell while ice skating. Denies new or worsening dyspnea or wheezing. Denies headaches or dizziness.  Denies numbness, tingling or weakness  In the extremities.  Denies dizziness or presyncopal episodes Reports lower extremity edema      Please see additional documentation for Objective, Assessment and Plan sections. Pertinent additional documentation may be included in corresponding procedure notes, imaging studies, problem based documentation and patient instructions. Please see these sections of the encounter for additional information regarding this visit.  CMA/ATC served as Neurosurgeonscribe during this visit. History, Physical, and Plan performed by medical provider. Documentation  and orders reviewed and attested to.      Andrena MewsMichael D Rigby, DO    [facility]

## 2017-12-11 ENCOUNTER — Telehealth: Payer: Self-pay | Admitting: Sports Medicine

## 2017-12-11 MED FILL — COMBIGAN EYE DROPS: 0.2-0.5 | 30 days supply | Qty: 5 | Fill #3

## 2017-12-11 NOTE — Telephone Encounter (Signed)
Copied from CRM [id number]. Topic: General - Other >> Dec 10, 2017 11:07 AM Gerrianne ScalePayne, Angela L wrote: Reason for CRM: patient calling about his MRI referral want to know why he haven't heard from anyone  Patient had not heard due to the MRI just being placed 4 days ago. It is a non-urgent referral that had not been worked yet. Per Dr. Janeece Riggersigby's CMA Gearldine BienenstockBrandy, [facility] has been called and a prior Berkley Harveyauth has been initiated. Per [facility] MRI is okay, however patient is seeing Dr. Berline Choughigby under "un-coordinated care" as [facility] was not contacted by patient prior to seeing Dr. Berline Choughigby.  Once MRI is scheduled, we will give [facility] the date of service.  Left voicemail for patient to call back and speak with myself or Gearldine BienenstockBrandy about the MRI.

## 2017-12-12 ENCOUNTER — Telehealth: Payer: Self-pay | Admitting: Radiology

## 2017-12-12 NOTE — Telephone Encounter (Signed)
Please advise 

## 2017-12-12 NOTE — Telephone Encounter (Signed)
Copied from CRM [id number]. Topic: General - Other >> Dec 10, 2017 11:07 AM Gerrianne ScalePayne, Angela L wrote: Reason for CRM: patient calling about his MRI referral want to know why he haven't heard from anyone  >> Dec 12, 2017  4:25 PM Rudi CocoLathan, Latoya M, [place] wrote: Had pt. On hold to find number to give him info. Somehow got disconnected Called pt. Back  And left him a voicemail with the medical benefits number to [phone]) he stated that he did not need to speak back with brandy. He wanted to speak with someone in administration that handles insurance.

## 2017-12-13 NOTE — Telephone Encounter (Signed)
Noted  

## 2017-12-13 NOTE — Telephone Encounter (Signed)
Called and spoke with patient to see what was needed at this time-  He stated he just wanted to let us know that he had spoken with [facility] and they stated his MRI was going to be filed under coordinated care, and that he appreciated all our effort and work to try and help him.  I informed him I would call [employer] to ensure it was going to be covered(a secondary check) and that if everything was good, patient would not hear from us until after the MRI. Patient verbalized understanding.   I then called and spoke with a [employer] representative. He stated that the MRI was going to be covered, and that any more visits with Dr. Berline Choughigby (for this same issue) would also be covered under coordinated care.  At this time, no further action needed.

## 2017-12-17 ENCOUNTER — Ambulatory Visit
Admission: RE | Admit: 2017-12-17 | Discharge: 2017-12-17 | Disposition: A | Discharge status: Home / Self Care | Payer: No Typology Code available for payment source | Source: Ambulatory Visit | Attending: Sports Medicine | Admitting: Sports Medicine

## 2017-12-17 DIAGNOSIS — M25561 Pain in right knee: Secondary | ICD-10-CM

## 2017-12-18 ENCOUNTER — Encounter: Payer: Self-pay | Admitting: Sports Medicine

## 2017-12-20 ENCOUNTER — Encounter: Payer: Self-pay | Admitting: Sports Medicine

## 2017-12-20 ENCOUNTER — Ambulatory Visit: Payer: Self-pay

## 2017-12-20 ENCOUNTER — Ambulatory Visit: Payer: No Typology Code available for payment source | Admitting: Sports Medicine

## 2017-12-20 VITALS — BP 116/86 | HR 88 | Ht 68.5 in | Wt 188.0 lb

## 2017-12-20 DIAGNOSIS — M1711 Unilateral primary osteoarthritis, right knee: Secondary | ICD-10-CM

## 2017-12-20 DIAGNOSIS — M25512 Pain in left shoulder: Secondary | ICD-10-CM | POA: Diagnosis not present

## 2017-12-20 DIAGNOSIS — M25561 Pain in right knee: Secondary | ICD-10-CM

## 2017-12-20 NOTE — Procedures (Signed)
PROCEDURE NOTE:  [facility] Guided: Injection: Left subacromial shoulder  DESCRIPTION OF PROCEDURE:  The patient's clinical condition is marked by substantial pain and/or significant functional disability. Other conservative therapy has not provided relief, is contraindicated, or not appropriate. There is a reasonable likelihood that injection will significantly improve the patient's pain and/or functional impairment.  After discussing the risks, benefits and expected outcomes of the injection and all questions were reviewed and answered, the patient wished to undergo the above named procedure. Verbal consent was obtained. The skin was then prepped in sterile fashion and the target structure was injected as below:  PREP: Alcohol, Ethel Chloride,  APPROACH: posterior, single injection, 21g 2in. INJECTATE: 3cc: 0.5% marcaine, 1cc: 40mg /mL Kenalog   ASPIRATE: None   DRESSING: Band-Aid   Post procedural instructions including recommending icing and warning signs for infection were reviewed.   This procedure was well tolerated and there were no complications.

## 2017-12-20 NOTE — Progress Notes (Signed)
Veverly FellsMichael D. Delorise Shinerigby, DO  [facility] [place] [phone]  Waylan BogaGill R Morton - 62 y.Morton. male MRN [id number]  Date of birth: [birth year]  Visit Date: 12/20/2017  PCP: Shelva MajesticHunter, Stephen O, MD   Referred by: Shelva MajesticHunter, Stephen O, MD  Scribe for today's visit: Stevenson ClinchBrandy Coleman, CMA     SUBJECTIVE:  Chad Morton is here for Follow-up (R shouler pain)  11/22/17: His R ant-med knee pain symptoms INITIALLY: Began about 3 weeks w/ no known MOI.  Has a hx of B ACL reconstructions. Described as 4/10 moderate pain at rest and 7/10 at it's worst, nonradiating Worsened with weight-bearing Improved with Naproxen Additional associated symptoms include: no reported popping or clicking; moderate swelling per pt report At this time symptoms are improving compared to onset  He has been taking Naproxen (3 pills/day; 2 in the AM and 1 in PM)  12/20/17: Compared to the last office visit, his previously described symptoms are worsening. He cannot walk without limping which is causing hip pain. Pain has been waking him from sleep.  Current symptoms are moderate & are nonradiating He has been taking Naproxen with minimal relief.   ROS Reports night time disturbances. Denies fevers, chills, or night sweats. Denies unexplained weight loss. Denies personal history of cancer. Denies changes in bowel or bladder habits. Denies recent unreported falls. Denies new or worsening dyspnea or wheezing. Denies headaches or dizziness.  Denies numbness, tingling or weakness  In the extremities.  Denies dizziness or presyncopal episodes Reports lower extremity edema    HISTORY & PERTINENT PRIOR DATA:  Prior History reviewed and updated per electronic medical record.  Significant/pertinent history, findings, studies include:  reports that he has never smoked. He has never used smokeless tobacco. Recent Labs    07/30/17  HGBA1C 7.1   No specialty comments available. No problems  updated.  OBJECTIVE:  VS:  HT:5' 8.5" (174 cm)   WT:188 lb (85.3 kg)  BMI:28.17    BP:116/86  HR:88bpm  TEMP: ( )  RESP:98 %   PHYSICAL EXAM: Constitutional: WDWN, Non-toxic appearing. Psychiatric: Alert & appropriately interactive.  Not depressed or anxious appearing. Respiratory: No increased work of breathing.  Trachea Midline Eyes: Pupils are equal.  EOM intact without nystagmus.  No scleral icterus  VASCULAR: warm to touch upper and lower extremity neuro exam: unremarkable  Left shoulder overall well aligned without significant deformity.  Full overhead range of motion as well as rotation testing, speeds test Morton'Brien's testing.  Positive Hawkins and Neer's.    ASSESSMENT & PLAN:   1. Arthralgia of right knee   2. Primary osteoarthritis of right knee   3. Pain in joint of left shoulder     PLAN: Will obtain a preapproval for Zilretta injection for the right knee.  Left subacromial injection performed today.  Begin therapeutic exercises previously prescribed for the right shoulder as well.   Follow-up: Return in about 6 weeks (around 01/31/2018) for as needed for Zillretta injection.     Please see additional documentation for Objective, Assessment and Plan sections. Pertinent additional documentation may be included in corresponding procedure notes, imaging studies, problem based documentation and patient instructions. Please see these sections of the encounter for additional information regarding this visit.  CMA/ATC served as Neurosurgeonscribe during this visit. History, Physical, and Plan performed by medical provider. Documentation and orders reviewed and attested to.      Andrena MewsMichael D Rigby, DO    [facility]

## 2017-12-20 NOTE — Patient Instructions (Signed)

## 2017-12-22 ENCOUNTER — Encounter: Payer: Self-pay | Admitting: Sports Medicine

## 2017-12-26 ENCOUNTER — Other Ambulatory Visit: Payer: Self-pay

## 2017-12-26 ENCOUNTER — Encounter: Payer: Self-pay | Admitting: Sports Medicine

## 2017-12-26 ENCOUNTER — Ambulatory Visit (AMBULATORY_SURGERY_CENTER): Payer: Self-pay | Admitting: *Deleted

## 2017-12-26 VITALS — Ht 68.0 in | Wt 185.0 lb

## 2017-12-26 DIAGNOSIS — Z800 Family history of malignant neoplasm of digestive organs: Secondary | ICD-10-CM

## 2017-12-26 DIAGNOSIS — Z860100 Personal history of colon polyps, unspecified: Secondary | ICD-10-CM

## 2017-12-26 MED ORDER — NA SULFATE-K SULFATE-MG SULF 17.5-3.13-1.6 GM/177ML PO SOLN: ORAL | 0 refills | Status: DC

## 2017-12-26 MED FILL — SUPREP BOWEL PREP KIT: 17.5-3.13-1 | 1 days supply | Qty: 354 | Fill #0

## 2017-12-26 NOTE — Progress Notes (Signed)
Patient denies any allergies to eggs or soy. Patient denies any problems with anesthesia/sedation. Patient denies any oxygen use at home. Patient denies taking any diet/weight loss medications or blood thinners. EMMI education assisgned to patient on colonoscopy, this was explained and instructions given to patient. 

## 2017-12-31 NOTE — Telephone Encounter (Signed)
Patient said ok to the message and just update him as you can through [employer] Gr[employer]mychart

## 2018-01-09 ENCOUNTER — Other Ambulatory Visit: Payer: Self-pay

## 2018-01-09 ENCOUNTER — Ambulatory Visit (AMBULATORY_SURGERY_CENTER): Payer: No Typology Code available for payment source | Admitting: Gastroenterology

## 2018-01-09 ENCOUNTER — Encounter: Payer: Self-pay | Admitting: Gastroenterology

## 2018-01-09 VITALS — BP 127/81 | HR 96 | Temp 97.3°F | Resp 22 | Ht 68.0 in | Wt 185.0 lb

## 2018-01-09 DIAGNOSIS — Z86010 Personal history of colonic polyps: Secondary | ICD-10-CM | POA: Diagnosis present

## 2018-01-09 DIAGNOSIS — Z800 Family history of malignant neoplasm of digestive organs: Secondary | ICD-10-CM

## 2018-01-09 DIAGNOSIS — Z1211 Encounter for screening for malignant neoplasm of colon: Secondary | ICD-10-CM

## 2018-01-09 MED ORDER — SODIUM CHLORIDE 0.9 % IV SOLN: 500.0000 mL | Freq: Once | INTRAVENOUS | Status: DC

## 2018-01-09 NOTE — Patient Instructions (Signed)
YOU HAD AN ENDOSCOPIC PROCEDURE TODAY AT THE [facility]:   Refer to the procedure report that was given to you for any specific questions about what was found during the examination.  If the procedure report does not answer your questions, please call your gastroenterologist to clarify.  If you requested that your care partner not be given the details of your procedure findings, then the procedure report has been included in a sealed envelope for you to review at your convenience later.  YOU SHOULD EXPECT: Some feelings of bloating in the abdomen. Passage of more gas than usual.  Walking can help get rid of the air that was put into your GI tract during the procedure and reduce the bloating. If you had a lower endoscopy (such as a colonoscopy or flexible sigmoidoscopy) you may notice spotting of blood in your stool or on the toilet paper. If you underwent a bowel prep for your procedure, you may not have a normal bowel movement for a few days.  Please Note:  You might notice some irritation and congestion in your nose or some drainage.  This is from the oxygen used during your procedure.  There is no need for concern and it should clear up in a day or so.  SYMPTOMS TO REPORT IMMEDIATELY:   Following lower endoscopy (colonoscopy or flexible sigmoidoscopy):  Excessive amounts of blood in the stool  Significant tenderness or worsening of abdominal pains  Swelling of the abdomen that is new, acute  Fever of 100F or higher   For urgent or emergent issues, a gastroenterologist can be reached at any hour by calling [phone].   DIET:  We do recommend a small meal at first, but then you may proceed to your regular diet.  Drink plenty of fluids but you should avoid alcoholic beverages for 24 hours. Try to increase the fiber in your diet, and drink plenty of watrer.  ACTIVITY:  You should plan to take it easy for the rest of today and you should NOT DRIVE or use heavy machinery until  tomorrow (because of the sedation medicines used during the test).    FOLLOW UP: Our staff will call the number listed on your records the next business day following your procedure to check on you and address any questions or concerns that you may have regarding the information given to you following your procedure. If we do not reach you, we will leave a message.  However, if you are feeling well and you are not experiencing any problems, there is no need to return our call.  We will assume that you have returned to your regular daily activities without incident.   SIGNATURES/CONFIDENTIALITY: You and/or your care partner have signed paperwork which will be entered into your electronic medical record.  These signatures attest to the fact that that the information above on your After Visit Summary has been reviewed and is understood.  Full responsibility of the confidentiality of this discharge information lies with you and/or your care-partner.

## 2018-01-09 NOTE — Progress Notes (Signed)
To  Recovery, report to RN, VSS

## 2018-01-09 NOTE — Op Note (Signed)
[facility] Patient Name: Chad Morton Procedure Date: 01/09/2018 8:15 AM MRN: [id number] Endoscopist: Rachael Feeaniel P  , MD Age: [age] Referring MD:  Date of Birth: 1956/03/31 Gender: Male Account #: [id number] Procedure:                Colonoscopy Indications:              High risk colon cancer surveillance: Personal                            history of colonic polyps, Family history of colon                            cancer in a first-degree relative (father diagnosed                            in his 3940s), Colonoscopy 2010 two subCM adenomas Medicines:                Monitored Anesthesia Care Procedure:                Pre-Anesthesia Assessment:                           - Prior to the procedure, a History and Physical                            was performed, and patient medications and                            allergies were reviewed. The patient's tolerance of                            previous anesthesia was also reviewed. The risks                            and benefits of the procedure and the sedation                            options and risks were discussed with the patient.                            All questions were answered, and informed consent                            was obtained. Prior Anticoagulants: The patient has                            taken no previous anticoagulant or antiplatelet                            agents. ASA Grade Assessment: II - A patient with                            mild systemic disease. After reviewing the risks  and benefits, the patient was deemed in                            satisfactory condition to undergo the procedure.                           After obtaining informed consent, the colonoscope                            was passed under direct vision. Throughout the                            procedure, the patient's blood pressure, pulse, and                            oxygen  saturations were monitored continuously. The                            Colonoscope was introduced through the anus and                            advanced to the the cecum, identified by                            appendiceal orifice and ileocecal valve. The                            colonoscopy was performed without difficulty. The                            patient tolerated the procedure well. The quality                            of the bowel preparation was good. The ileocecal                            valve, appendiceal orifice, and rectum were                            photographed. Scope In: 8:32:15 AM Scope Out: 8:43:49 AM Scope Withdrawal Time: 0 hours 7 minutes 31 seconds  Total Procedure Duration: 0 hours 11 minutes 34 seconds  Findings:                 Multiple small and large-mouthed diverticula were                            found in the left colon.                           Internal hemorrhoids were found. The hemorrhoids                            were small.  The exam was otherwise without abnormality on                            direct and retroflexion views. Complications:            No immediate complications. Estimated blood loss:                            None. Estimated Blood Loss:     Estimated blood loss: none. Impression:               - Diverticulosis in the left colon.                           - Internal hemorrhoids.                           - The examination was otherwise normal on direct                            and retroflexion views.                           - No specimens collected. Recommendation:           - Patient has a contact number available for                            emergencies. The signs and symptoms of potential                            delayed complications were discussed with the                            patient. Return to normal activities tomorrow.                            Written discharge  instructions were provided to the                            patient.                           - Resume previous diet.                           - Continue present medications.                           - Repeat colonoscopy in 5 years for screening. Rachael Fee, MD 01/09/2018 8:49:57 AM This report has been signed electronically.

## 2018-01-10 ENCOUNTER — Telehealth: Payer: Self-pay | Admitting: *Deleted

## 2018-01-10 NOTE — Telephone Encounter (Signed)
  Follow up Call-  Call back number 01/09/2018  Post procedure Call Back phone  # [phone]  Permission to leave phone message Yes  Some recent data might be hidden     Patient questions:  Do you have a fever, pain , or abdominal swelling? No. Pain Score  0 *  Have you tolerated food without any problems? Yes.    Have you been able to return to your normal activities? Yes.    Do you have any questions about your discharge instructions: Diet   No. Medications  No. Follow up visit  No.  Do you have questions or concerns about your Care? No.  Actions: * If pain score is 4 or above: No action needed, pain <4.

## 2018-01-14 ENCOUNTER — Other Ambulatory Visit: Payer: Self-pay

## 2018-01-14 ENCOUNTER — Telehealth: Payer: Self-pay

## 2018-01-14 MED ORDER — PRAVASTATIN SODIUM 40 MG PO TABS: 40.0000 mg | ORAL_TABLET | Freq: Every day | ORAL | 0 refills | Status: DC

## 2018-01-14 MED ORDER — METFORMIN HCL ER 500 MG PO TB24: 1000.0000 mg | ORAL_TABLET | Freq: Every day | ORAL | 1 refills | Status: DC

## 2018-01-14 MED FILL — BENAZEPRIL HCL 40 MG TABLET: 40 | 90 days supply | Qty: 90 | Fill #0

## 2018-01-14 MED FILL — NITROGLYCERIN 0.2 MG/HR PTC: 0.2 | 30 days supply | Qty: 15 | Fill #1

## 2018-01-14 MED FILL — COMBIGAN EYE DROPS: 0.2-0.5 | 30 days supply | Qty: 5 | Fill #4

## 2018-01-14 NOTE — Telephone Encounter (Signed)
Copied from CRM [id number]. Topic: General - Other >> Jan 11, 2018 11:48 AM Gerrianne ScalePayne, Angela L wrote: Reason for CRM: pt calling to see if if the prior authorization has went through for Xarelto he states that he has been going through this for about 4 weeks now he would like for Brandy to give him a call back

## 2018-01-15 ENCOUNTER — Encounter: Payer: Self-pay | Admitting: Sports Medicine

## 2018-01-15 NOTE — Telephone Encounter (Signed)
Pt got approval, he needs to have shot asap.  Please call [phone] to schedule.  Pt has been waiting weeks to get this approved

## 2018-01-16 ENCOUNTER — Ambulatory Visit: Payer: No Typology Code available for payment source | Admitting: Family Medicine

## 2018-01-16 ENCOUNTER — Encounter: Payer: Self-pay | Admitting: Family Medicine

## 2018-01-16 DIAGNOSIS — M1711 Unilateral primary osteoarthritis, right knee: Secondary | ICD-10-CM

## 2018-01-16 MED FILL — METFORMIN HCL ER 500 MG TAB: 500 | 90 days supply | Qty: 180 | Fill #0

## 2018-01-16 MED FILL — PRAVASTATIN NA 40 MG TAB: 40 | 90 days supply | Qty: 90 | Fill #0

## 2018-01-16 NOTE — Progress Notes (Signed)
[name] [address] [place], [place] [address] Phone: [phone] Subjective:     CC: Right knee pain  OZH:[id number]  Chad Morton is a 62 y.o. male coming in with complaint of right knee pain. He has a history of ACL reconstruction. He notes that his left knee also bothers him due to compensating for the right knee. Pain in the right knee can be so bad that he does not want to get out of bed.  Patient has been seen by another physician.  Patient has had multiple steroid injections with no significant benefit.  Was sent for an MRI.  MRI independently visualized by me.  Patient did have a odd angle of the ACL restriction as well as does have some residual bone edema.  Patient also has some osteoarthritic changes.  Patient has failed conservative therapy and has been approved for a long steroid and the patient thinks he would like to be injected with today.      Past Medical History:  Diagnosis Date  . 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 Diabetes mellitus without complication (HCC)   . Glaucoma    [facility]  . Hyperlipidemia   . Hypertension   . Post-operative nausea and vomiting    Past Surgical History:  Procedure Laterality Date  . COLONOSCOPY  2010  . KNEE ARTHROSCOPY W/ ACL RECONSTRUCTION Bilateral    both knees x1  . MANDIBLE FRACTURE SURGERY Bilateral   . TONSILECTOMY/ADENOIDECTOMY WITH MYRINGOTOMY     childhood   Social History   Socioeconomic History  . Marital status: Married    Spouse name: Not on file  . Number of children: Not on file  . Years of education: Not on file  . Highest education level: Not on file  Occupational History  . Not on file  Social Needs  . Financial resource strain: Not on file  . Food insecurity:    Worry: Not on file    Inability: Not on file  . Transportation needs:    Medical: Not on file    Non-medical: Not on file  Tobacco Use  . Smoking status: Never  Smoker  . Smokeless tobacco: Never Used  Substance and Sexual Activity  . Alcohol use: Yes    Alcohol/week: 6.0 - 7.2 oz    Types: 10 - 12 Standard drinks or equivalent per week  . Drug use: No  . Sexual activity: Yes  Lifestyle  . Physical activity:    Days per week: Not on file    Minutes per session: Not on file  . Stress: Not on file  Relationships  . Social connections:    Talks on phone: Not on file    Gets together: Not on file    Attends religious service: Not on file    Active member of club or organization: Not on file    Attends meetings of clubs or organizations: Not on file    Relationship status: Not on file  Other Topics Concern  . Not on file  Social History Narrative   Married. 3 kids. 4 soon to be 5 grandkids (first girl) in 2018.       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 Daughter works for [employer]- geography major      Hobbies: time with family, beach, football (season tickets to [employer])   Allergies  Allergen Reactions  . Diphtheria Toxoid Swelling   Family  History  Problem Relation Age of Onset  . Myasthenia gravis Mother        complications from this  . Colon cancer Father        diagnosed 3949- died within a year of diagnosis  . Diabetes Brother   . Hyperlipidemia Brother      Past medical history, social, surgical and family history all reviewed in electronic medical record.  No pertanent information unless stated regarding to the chief complaint.   Review of Systems:Review of systems updated and as accurate as of 01/16/18  No headache, visual changes, nausea, vomiting, diarrhea, constipation, dizziness, abdominal pain, skin rash, fevers, chills, night sweats, weight loss, swollen lymph nodes, body aches, joint swelling, muscle aches, chest pain, shortness of breath, mood changes.   Objective  There were no vitals taken for this visit. Systems examined below as of 01/16/18   General: No apparent distress alert and  oriented x3 mood and affect normal, dressed appropriately.  HEENT: Pupils equal, extraocular movements intact  Respiratory: Patient's speak in full sentences and does not appear short of breath  Cardiovascular: No lower extremity edema, non tender, no erythema  Skin: Warm dry intact with no signs of infection or rash on extremities or on axial skeleton.  Abdomen: Soft nontender  Neuro: Cranial nerves II through XII are intact, neurovascularly intact in all extremities with 2+ DTRs and 2+ pulses.  Lymph: No lymphadenopathy of posterior or anterior cervical chain or axillae bilaterally.  Gait mild antalgic gait MSK:  Non tender with full range of motion and good stability and symmetric strength and tone of shoulders, elbows, wrist, hip, and ankles bilaterally.  Right knee does show a varus deformity of the knee.  Patient does have some mild instability especially with the pivot test.  Patient's ACL is intact but does have some mild laxity compared to contralateral side.  Patient does have crepitus with range of motion.  Pain over the medial joint line.  After informed written and verbal consent, patient was seated on exam table. Right knee was prepped with alcohol swab and utilizing anterolateral approach, patient's right knee space was injected with triamcinolone acetonide extended release 32 mg. Patient tolerated the procedure well without immediate complications.   Impression and Recommendations:     This case required medical decision making of moderate complexity.      Note: This dictation was prepared with Dragon dictation along with smaller phrase technology. Any transcriptional errors that result from this process are unintentional.

## 2018-01-16 NOTE — Patient Instructions (Addendum)
Good to see you  Chad Morton is your friend. Ice 20 minutes 2 times daily. Usually after activity and before bed. pennsaid pinkie amount topically 2 times daily as needed. Especially at night INjected the knee and lets see how it goes.  See me again in 3-4 weeks or follow up with Dr. Berline Choughigby

## 2018-01-16 NOTE — Telephone Encounter (Signed)
Spoke with patient and advised of authorization. Pt has schedule with Dr. Katrinka BlazingSmith for injection. Called [employer] plan and updated info regarding provider and appointment date change. Pt aware. He will also ask Dr. Katrinka BlazingSmith if its OK to use nitroglycerin patches on his other shoulder while he is there. He is appreciative of the work that was done getting the Zilretta injection authorized.

## 2018-01-16 NOTE — Telephone Encounter (Signed)
Fax received at the end of day 01/14/18, unfortunately I was out of the office 01/15/18. Called pt and left VM to call the office.

## 2018-01-16 NOTE — Assessment & Plan Note (Signed)
Patient given injection today.  Tolerated the procedure well.  Discussed that medication is newer and we will monitor.  Patient will continue with conservative therapy including icing regimen and home exercise.  Patient will follow-up with me or Dr. Berline Choughigby again in 4 weeks.

## 2018-01-17 MED FILL — GLIMEPIRIDE 4 MG TABLET: 4 | 90 days supply | Qty: 135 | Fill #0

## 2018-01-17 NOTE — Telephone Encounter (Signed)
Pt returned call,  Asks for Gearldine BienenstockBrandy  To call him back

## 2018-01-17 NOTE — Telephone Encounter (Signed)
Spoke with patient, reports that injection went well yesterday. Dr. Smith told him its OK to use Katrinka Blazingnitroglycerin patches on his other shoulder. OV for 01/24/18 r/s to 02/14/18 as pt was advised to f/u with Dr. Katrinka BlazingSmith or Dr. Berline Choughigby in 3-4 weeks.

## 2018-01-24 ENCOUNTER — Ambulatory Visit: Payer: No Typology Code available for payment source | Admitting: Sports Medicine

## 2018-01-30 ENCOUNTER — Telehealth: Payer: Self-pay | Admitting: Family Medicine

## 2018-01-30 NOTE — Telephone Encounter (Signed)
Copied from CRM [id number]. Topic: Quick Communication - See Telephone Encounter >> Jan 30, 2018  1:58 PM Lorrine Kin, NT wrote: CRM for notification. See Telephone encounter for: 01/30/18. Patient calling and states that when he was sent to Dr Antoine Primas at [facility], he gave him samples of Pennsaid for his knee pain. Patient is requesting a prescription for this. States that it worked really well. Please advise.  CB#: [phone] [facility] - [place], [place] - [address]

## 2018-01-31 ENCOUNTER — Encounter: Payer: Self-pay | Admitting: Sports Medicine

## 2018-01-31 MED ORDER — DICLOFENAC SODIUM 2 % TD SOLN: TRANSDERMAL | 3 refills | Status: DC

## 2018-01-31 NOTE — Telephone Encounter (Signed)
Spoke to pt, advised him this will need to be sent into [facility] pharmacy. Pt understood.  Samples left at front desk to come by & pick-up.

## 2018-01-31 NOTE — Procedures (Signed)
X-Rays obtained at [facility] Interpreted by myself Andrena Mews, DO) during office visit.  Results were reviewed with the patient at the time of the visit.   3 VIEW X-RAY of: Right shoulder  FINDINGS:  Minimal degenerative changes.  Otherwise well aligned shoulder with type I to type II acromion.  Small amount of AC joint arthropathy.  No acute fracture or dislocation IMPRESSION:  Negative x-ray

## 2018-01-31 NOTE — Addendum Note (Signed)
Addended by: Edwena Felty T on: 01/31/2018 03:18 PM   Modules accepted: Orders

## 2018-02-12 LAB — HM DIABETES EYE EXAM

## 2018-02-13 NOTE — Progress Notes (Signed)
Chad Morton. Chad Morton Chad Morton at [facility] [phone]  Chad Morton - 62 y.o. male MRN [id number]  Date of birth: Sep 28, 1955  Visit Date: 02/14/2018  PCP: [name], MD   Referred by: [name], MD  I acted as a scribe for Dr. Gaspar Bidding, DO Chad Mull, LPN    SUBJECTIVE:  Chad Morton is here for Follow-up (Right Knee pain)  11/22/17: His R ant-med knee pain symptoms INITIALLY: Began about 3 weeks w/ no known MOI.  Has a hx of B ACL reconstructions. Described as 4/10 moderate pain at rest and 7/10 at it's worst, nonradiating Worsened with weight-bearing Improved with Naproxen Additional associated symptoms include: no reported popping or clicking; moderate swelling per pt report At this time symptoms are improving compared to onset  He has been taking Naproxen (3 pills/day; 2 in the AM and 1 in PM)  12/20/17: Compared to the last office visit, his previously described symptoms are worsening. He cannot walk without limping which is causing hip pain. Pain has been waking him from sleep.  Current symptoms are moderate & are nonradiating He has been taking Naproxen with minimal relief.   02/13/2018: Compared to the last office visit, his previously described symptoms are improving. Current symptoms pt is not having any pain at present time. He has been using Pennsaid and Naproxen with relief.  ROS Denies night time disturbances. Denies fevers, chills, or night sweats. Denies unexplained weight loss. Denies personal history of cancer. Denies changes in bowel or bladder habits. Denies recent unreported falls. Denies new or worsening dyspnea or wheezing. Denies headaches or dizziness.  Denies numbness, tingling or weakness  In the extremities.  Denies dizziness or presyncopal episodes Denies lower extremity edema    HISTORY & PERTINENT PRIOR DATA:  Prior History reviewed and updated per electronic medical  record.  Significant/pertinent history, findings, studies include:  reports that he has never smoked. He has never used smokeless tobacco. Recent Labs    07/30/17  HGBA1C 7.1   No specialty comments available. No problems updated.  OBJECTIVE:  VS:  HT:5\' 8"  (172.7 cm)   WT:181 lb 4 oz (82.2 kg)  BMI:27.57    BP:120/80  HR:79bpm  TEMP: ( )  RESP:    PHYSICAL EXAM: Constitutional: WDWN, Non-toxic appearing. Psychiatric: Alert & appropriately interactive.  Not depressed or anxious appearing. Respiratory: No increased work of breathing.  Trachea Midline Eyes: Pupils are equal.  EOM intact without nystagmus.  No scleral icterus  Vascular Exam: warm to touch no edema  lower extremity neuro exam: unremarkable normal strength normal sensation  MSK Exam: Knee is overall well aligned without significant deformity.  He has well-healed postsurgical incision across the anterior knee bilaterally.  Full flexion extension.  No significant medial or lateral joint line pain.   ASSESSMENT & PLAN:   1. Arthralgia of right knee   2. Primary osteoarthritis of right knee     PLAN: He is doing quite well following Zilretta injections.  We will plan on follow-up as needed for repeat injections.  Follow-up: Return if symptoms worsen or fail to improve.      Please see additional documentation for Objective, Assessment and Plan sections. Pertinent additional documentation may be included in corresponding procedure notes, imaging studies, problem based documentation and patient instructions. Please see these sections of the encounter for additional information regarding this visit.  CMA/ATC served as Neurosurgeon during this visit. History, Physical, and Plan  performed by medical provider. Documentation and orders reviewed and attested to.      Gerda Diss, [place] Physician

## 2018-02-14 ENCOUNTER — Encounter: Payer: Self-pay | Admitting: Sports Medicine

## 2018-02-14 ENCOUNTER — Ambulatory Visit (INDEPENDENT_AMBULATORY_CARE_PROVIDER_SITE_OTHER): Payer: No Typology Code available for payment source | Admitting: Sports Medicine

## 2018-02-14 VITALS — BP 120/80 | HR 79 | Ht 68.0 in | Wt 181.2 lb

## 2018-02-14 DIAGNOSIS — M25561 Pain in right knee: Secondary | ICD-10-CM

## 2018-02-14 DIAGNOSIS — M1711 Unilateral primary osteoarthritis, right knee: Secondary | ICD-10-CM

## 2018-02-15 ENCOUNTER — Encounter: Payer: Self-pay | Admitting: Family Medicine

## 2018-02-27 ENCOUNTER — Telehealth: Payer: Self-pay | Admitting: Family Medicine

## 2018-02-27 NOTE — Telephone Encounter (Signed)
Copied from CRM [id number]. Topic: Quick Communication - Rx Refill/Question >> Feb 27, 2018  2:29 PM Tamela OddiHarris, Eyonna Sandstrom J wrote: Medication: Diclofenac Sodium (PENNSAID) 2 % SOLN  Crystal from Pharmacy called requesting PA for medication.  CB # [phone]

## 2018-02-28 NOTE — Telephone Encounter (Signed)
See note

## 2018-02-28 NOTE — Telephone Encounter (Signed)
Insurance is requesting a Prior authorization for medication.

## 2018-03-05 ENCOUNTER — Ambulatory Visit: Payer: No Typology Code available for payment source | Admitting: Sports Medicine

## 2018-03-05 ENCOUNTER — Encounter: Payer: Self-pay | Admitting: Sports Medicine

## 2018-03-05 ENCOUNTER — Ambulatory Visit (INDEPENDENT_AMBULATORY_CARE_PROVIDER_SITE_OTHER): Payer: No Typology Code available for payment source

## 2018-03-05 ENCOUNTER — Ambulatory Visit: Payer: Self-pay

## 2018-03-05 VITALS — BP 130/84 | HR 96 | Ht 68.0 in | Wt 182.0 lb

## 2018-03-05 DIAGNOSIS — M25562 Pain in left knee: Secondary | ICD-10-CM

## 2018-03-05 NOTE — Patient Instructions (Addendum)
You had an injection today.  Things to be aware of after injection are listed below: . You may experience no significant improvement or even a slight worsening in your symptoms during the first 24 to 48 hours.  After that we expect your symptoms to improve gradually over the next 2 weeks for the medicine to have its maximal effect.  You should continue to have improvement out to 6 weeks after your injection. . Dr. Rigby recommends icing the site of the injection for 20 minutes  1-2 times the day of your injection . You may shower but no swimming, tub bath or Jacuzzi for 24 hours. . If your bandage falls off this does not need to be replaced.  It is appropriate to remove the bandage after 4 hours. . You may resume light activities as tolerated unless otherwise directed per Dr. Rigby during your visit  POSSIBLE STEROID SIDE EFFECTS:  Side effects from injectable steroids tend to be less than when taken orally however you may experience some of the symptoms listed below.  If experienced these should only last for a short period of time. Change in menstrual flow  Edema (swelling)  Increased appetite Skin flushing (redness)  Skin rash/acne  Thrush (oral) Yeast vaginitis    Increased sweating  Depression Increased blood glucose levels Cramping and leg/calf  Euphoria (feeling happy)  POSSIBLE PROCEDURE SIDE EFFECTS: The side effects of the injection are usually fairly minimal however if you may experience some of the following side effects that are usually self-limited and will is off on their own.  If you are concerned please feel free to call the office with questions:  Increased numbness or tingling  Nausea or vomiting  Swelling or bruising at the injection site   Please call our office if if you experience any of the following symptoms over the next week as these can be signs of infection:   Fever greater than 100.5F  Significant swelling at the injection site  Significant redness or drainage  from the injection site  If after 2 weeks you are continuing to have worsening symptoms please call our office to discuss what the next appropriate actions should be including the potential for a return office visit or other diagnostic testing.   I recommend you obtained a compression sleeve to help with your joint problems. There are many options on the market however I recommend obtaining a knee Body Helix compression sleeve.  You can find information (including how to appropriate measure yourself for sizing) can be found at www.Body Helix.com.  Many of these products are health savings account (HSA) eligible.   You can use the compression sleeve at any time throughout the day but is most important to use while being active as well as for 2 hours post-activity.   It is appropriate to ice following activity with the compression sleeve in place.    

## 2018-03-05 NOTE — Procedures (Signed)
PROCEDURE NOTE:  Ultrasound Guided: Aspiration and Injection: Left knee Images were obtained and interpreted by myself, Gaspar BiddingMichael Maan Zarcone, DO  Images have been saved and stored to PACS system. Images obtained on: GE S7 Ultrasound machine    ULTRASOUND FINDINGS:  moderate effusion moderate bakers cyst degenerative changes   DESCRIPTION OF PROCEDURE:  The patient's clinical condition is marked by substantial pain and/or significant functional disability. Other conservative therapy has not provided relief, is contraindicated, or not appropriate. There is a reasonable likelihood that injection will significantly improve the patient's pain and/or functional impairment.   After discussing the risks, benefits and expected outcomes of the injection and all questions were reviewed and answered, the patient wished to undergo the above named procedure.  Verbal consent was obtained.  The ultrasound was used to identify the target structure and adjacent neurovascular structures. The skin was then prepped in sterile fashion and the target structure was injected under direct visualization using sterile technique as below:  Single injection performed as below: PREP: Alcohol, Ethel Chloride and 5 cc 1% lidocaine on 25g 1.5 in. needle APPROACH:superiolateral, stopcock technique, 18g 1.5 in. INJECTATE: 2 cc 0.5% Marcaine and 2 cc 40mg /mL DepoMedrol ASPIRATE: 15cc serous fluid DRESSING: Band-Aid and Body Helix Full Knee Compression Sleeve  Post procedural instructions including recommending icing and warning signs for infection were reviewed.    This procedure was well tolerated and there were no complications.   IMPRESSION: Succesful Ultrasound Guided: Aspiration and Injection

## 2018-03-05 NOTE — Progress Notes (Signed)
Chad FellsMichael D. Delorise Shinerigby, DO  [facility] [phone]  Waylan BogaGill R Bossman - 62 y.o. male MRN [id number]  Date of birth: 03/28/1956  Visit Date: 03/05/2018  PCP: Shelva MajesticHunter, Stephen O, MD   Referred by: Shelva MajesticHunter, Stephen O, MD  Scribe(s) for today's visit: Stevenson ClinchBrandy Coleman, CMA  SUBJECTIVE:  Chad Morton is here for No chief complaint on file.   His L knee pain symptoms INITIALLY: Began about 6 months ago and has worsened over the past 2 weeks. He has had prior ACL reconstruction, about 30 yrs ago.  Described as moderate-severe aching and stabbing, non-radiating Worsened when first getting out of bed in the morning and trying to bend the knee.  Improved with movement throughout the day. Additional associated symptoms include: He has trouble bending the knee first thing in the morning. Pain is more medial than lateral. He has noticed some swelling around the knee. He has occasional clicking and popping.     At this time symptoms are worsening compared to onset. He has been using Pennsaid with some relief. He has tried taking Aleve with minimal relief.    REVIEW OF SYSTEMS: Reports night time disturbances. Denies fevers, chills, or night sweats. Denies unexplained weight loss. Denies personal history of cancer. Denies changes in bowel or bladder habits. Denies recent unreported falls. Denies new or worsening dyspnea or wheezing. Denies headaches or dizziness.  Denies numbness, tingling or weakness  In the extremities.  Denies dizziness or presyncopal episodes Denies lower extremity edema    HISTORY & PERTINENT PRIOR DATA:  Prior History reviewed and updated per electronic medical record.  Significant/pertinent history, findings, studies include:  reports that he has never smoked. He has never used smokeless tobacco. Recent Labs    07/30/17 03/12/18 1113  HGBA1C 7.1 7.1*   No specialty comments available. No problems  updated.  OBJECTIVE:  VS:  HT:5\' 8"  (172.7 cm)   WT:182 lb (82.6 kg)  BMI:27.68    BP:130/84  HR:96bpm  TEMP: ( )  RESP:97 %   PHYSICAL EXAM: Constitutional: WDWN, Non-toxic appearing. Psychiatric: Alert & appropriately interactive.  Not depressed or anxious appearing. Respiratory: No increased work of breathing.  Trachea Midline Eyes: Pupils are equal.  EOM intact without nystagmus.  No scleral icterus  Vascular Exam: warm to touch no edema  lower extremity neuro exam: unremarkable  MSK Exam: Left knee overall well aligned with mild degenerative bossing.  He has a small effusion and small amount of synovitis not significant.  Pain with McMurray's.  Positive patellar grind.   ASSESSMENT & PLAN:   1. Left knee pain, unspecified chronicity     PLAN: We will go ahead and inject the knee per procedure note and have them begin on hip and knee strenghtening exersises. Additionally we discussed the merits of compression and/or bracing and recommend prophylatic compression with activity.  Icing discussed PRN.  If persistent ongoing symptoms can consider repeat injections and viscous supplementation.  Follow-up: Return if symptoms worsen or fail to improve.      Please see additional documentation for Objective, Assessment and Plan sections. Pertinent additional documentation may be included in corresponding procedure notes, imaging studies, problem based documentation and patient instructions. Please see these sections of the encounter for additional information regarding this visit.  CMA/ATC served as Neurosurgeonscribe during this visit. History, Physical, and Plan performed by medical provider. Documentation and orders reviewed and attested to.      Andrena MewsMichael D Rigby, DO  [facility]

## 2018-03-06 MED FILL — COMBIGAN EYE DROPS: 0.2-0.5 | 30 days supply | Qty: 5 | Fill #5

## 2018-03-11 ENCOUNTER — Ambulatory Visit: Payer: No Typology Code available for payment source | Admitting: Family Medicine

## 2018-03-12 ENCOUNTER — Encounter: Payer: Self-pay | Admitting: Family Medicine

## 2018-03-12 ENCOUNTER — Ambulatory Visit (INDEPENDENT_AMBULATORY_CARE_PROVIDER_SITE_OTHER): Payer: No Typology Code available for payment source | Admitting: Family Medicine

## 2018-03-12 VITALS — BP 124/80 | HR 111 | Temp 98.1°F | Ht 68.0 in | Wt 181.6 lb

## 2018-03-12 DIAGNOSIS — I10 Essential (primary) hypertension: Secondary | ICD-10-CM

## 2018-03-12 DIAGNOSIS — E1169 Type 2 diabetes mellitus with other specified complication: Secondary | ICD-10-CM

## 2018-03-12 DIAGNOSIS — E785 Hyperlipidemia, unspecified: Secondary | ICD-10-CM

## 2018-03-12 DIAGNOSIS — E119 Type 2 diabetes mellitus without complications: Secondary | ICD-10-CM | POA: Diagnosis not present

## 2018-03-12 LAB — COMPREHENSIVE METABOLIC PANEL
ALT: 13 U/L (ref 0–53)
AST: 12 U/L (ref 0–37)
Albumin: 4.1 g/dL (ref 3.5–5.2)
Alkaline Phosphatase: 45 U/L (ref 39–117)
BUN: 18 mg/dL (ref 6–23)
CHLORIDE: 99 meq/L (ref 96–112)
CO2: 28 mEq/L (ref 19–32)
CREATININE: 0.92 mg/dL (ref 0.40–1.50)
Calcium: 9.7 mg/dL (ref 8.4–10.5)
GFR: 88.63 mL/min (ref >60.00)
GLUCOSE: 160 mg/dL — AB (ref 70–99)
POTASSIUM: 4.2 meq/L (ref 3.5–5.1)
SODIUM: 135 meq/L (ref 135–145)
TOTAL PROTEIN: 7.1 g/dL (ref 6.0–8.3)
Total Bilirubin: 0.6 mg/dL (ref 0.2–1.2)

## 2018-03-12 LAB — HEMOGLOBIN A1C: HEMOGLOBIN A1C: 7.1 % — AB (ref 4.6–6.5)

## 2018-03-12 LAB — LDL CHOLESTEROL, DIRECT: Direct LDL: 71 mg/dL

## 2018-03-12 MED ORDER — METFORMIN HCL ER 500 MG PO TB24: 1000.0000 mg | ORAL_TABLET | Freq: Two times a day (BID) | ORAL | 2 refills | Status: DC

## 2018-03-12 MED ORDER — PRAVASTATIN SODIUM 40 MG PO TABS: 40.0000 mg | ORAL_TABLET | Freq: Every day | ORAL | 2 refills | Status: DC

## 2018-03-12 MED ORDER — BENAZEPRIL HCL 40 MG PO TABS: 40.0000 mg | ORAL_TABLET | Freq: Every day | ORAL | 2 refills | Status: DC

## 2018-03-12 MED ORDER — GLIMEPIRIDE 4 MG PO TABS: 6.0000 mg | ORAL_TABLET | Freq: Every day | ORAL | 2 refills | Status: DC

## 2018-03-12 NOTE — Assessment & Plan Note (Signed)
S: mild poorly controlled on last check with a1c 7.1. We discussed lifestyle changes and continue amaryl 6 mg (had been up from 4mg  when a1c 7.1) plus metformin 1g BID (we clarified he has been on BID 1g today) Lab Results  Component Value Date   HGBA1C 7.1 07/30/2017   A/P:  Update a1c today and cmp. Doing wellsmith program since last Thursday.

## 2018-03-12 NOTE — Patient Instructions (Addendum)
Health Maintenance Due  Topic Date Due  . HEMOGLOBIN A1C - today 01/27/2018   No changes in medication  Please stop by lab before you go

## 2018-03-12 NOTE — Progress Notes (Signed)
Subjective:  Chad Morton is a 62 y.o. year old very pleasant male patient who presents for/with See problem oriented charting ROS- No chest pain or shortness of breath. No headache or blurry vision. No hypoglycemia.    Past Medical History-  Patient Active Problem List   Diagnosis Date Noted  . 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 Hypertension, essential 10/09/2017    Priority: Medium  . Hyperlipidemia associated with type 2 diabetes mellitus (HCC) 10/09/2017    Priority: Medium  . Glaucoma     Priority: Medium  . Family history of colon cancer 10/09/2017    Priority: Low  . Degenerative arthritis of right knee 01/16/2018  . Type 2 diabetes mellitus without complication, without long-term current use of insulin (HCC) 05/30/2017  . Prehypertension 05/30/2017  . Pure hypercholesterolemia 05/30/2017    Medications- reviewed and updated Current Outpatient Medications  Medication Sig Dispense Refill  . aspirin EC 81 MG tablet Take 1 tablet by mouth daily.    . benazepril (LOTENSIN) 40 MG tablet Take 1 tablet (40 mg total) by mouth daily. 90 tablet 2  . COMBIGAN 0.2-0.5 % ophthalmic solution Place 1 drop into both eyes 2 (two) times daily.  3  . Diclofenac Sodium (PENNSAID) 2 % SOLN Apply 1 pump twice daily. (Patient taking differently: Apply 1 pump as needed.) 112 g 3  . metFORMIN (GLUCOPHAGE-XR) 500 MG 24 hr tablet Take 2 tablets (1,000 mg total) by mouth 2 (two) times daily. 360 tablet 2  . pravastatin (PRAVACHOL) 40 MG tablet Take 1 tablet (40 mg total) by mouth daily. 90 tablet 2  . glimepiride (AMARYL) 4 MG tablet Take 1.5 tablets (6 mg total) by mouth daily. 135 tablet 2  . nitroGLYCERIN (NITRODUR - DOSED IN MG/24 HR) 0.2 mg/hr patch Place 1/4 to 1/2 of a patch over affected region. Remove and replace once daily.  Slightly alter skin placement daily (Patient not taking: Reported on 03/12/2018) 30 patch 1   No current facility-administered  medications for this visit.     Objective: BP 124/80 (BP Location: Left Arm, Patient Position: Sitting, Cuff Size: Normal)   Pulse (!) 111   Temp 98.1 F (36.7 C) (Oral)   Ht 5\' 8"  (1.727 m)   Wt 181 lb 9.6 oz (82.4 kg)   SpO2 98%   BMI 27.61 kg/m  Gen: NAD, resting comfortably CV: regular rate- slightly tachycardic,  no murmurs rubs or gallops Lungs: CTAB no crackles, wheeze, rhonchi Abdomen: soft/nontender/nondistended/normal bowel sounds. No rebound or guarding.  Ext: no edema Skin: warm, dry  Assessment/Plan:  Other notes: 1. having b/l hand and wrist pain, L shoulder pain and R at times- flares up from time to time and has variable periods for it to resolve. Sometimes the next day there is no issue. He is going to chat with Dr. Berline Choughigby. 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 Tachycardia on exam- tends to have high HR at baseline. Feels fine. Consider updating EKG if present at next visit.   Hypertension S: controlled on benazepril 40mg  BP Readings from Last 3 Encounters:  03/12/18 124/80  03/05/18 130/84  02/14/18 120/80  A/P: We discussed blood pressure goal of <140/90. Continue current meds  Hyperlipidemia S: well controlled on pravastatin 40mg  with last LDL 98 in November 2018  A/P:  continue current rx, check cmp and direct LDL  Type 2 diabetes  mellitus without complication, without long-term current use of insulin (HCC) S: mild poorly controlled on last check with a1c 7.1. We discussed lifestyle changes and continue amaryl 6 mg (had been up from 4mg  when a1c 7.1) plus metformin 1g BID (we clarified he has been on BID 1g today) Lab Results  Component Value Date   HGBA1C 7.1 07/30/2017   A/P:  Update a1c today and cmp. Doing well[facility] program since last Thursday.    Return in about 5 months (around 08/12/2018) for physical, come fasting.  Lab/Order associations: Controlled type 2  diabetes mellitus without complication, without long-term current use of insulin (HCC) - Plan: glimepiride (AMARYL) 4 MG tablet, metFORMIN (GLUCOPHAGE-XR) 500 MG 24 hr tablet, Hemoglobin A1c, Comprehensive metabolic panel, LDL cholesterol, direct  Hypertension, essential - Plan: benazepril (LOTENSIN) 40 MG tablet, Comprehensive metabolic panel, LDL cholesterol, direct  Hyperlipidemia associated with type 2 diabetes mellitus (HCC) - Plan: pravastatin (PRAVACHOL) 40 MG tablet, Comprehensive metabolic panel, LDL cholesterol, direct  Type 2 diabetes mellitus without complication, without long-term current use of insulin (HCC)  Meds ordered this encounter  Medications  . benazepril (LOTENSIN) 40 MG tablet    Sig: Take 1 tablet (40 mg total) by mouth daily.    Dispense:  90 tablet    Refill:  2  . glimepiride (AMARYL) 4 MG tablet    Sig: Take 1.5 tablets (6 mg total) by mouth daily.    Dispense:  135 tablet    Refill:  2  . metFORMIN (GLUCOPHAGE-XR) 500 MG 24 hr tablet    Sig: Take 2 tablets (1,000 mg total) by mouth 2 (two) times daily.    Dispense:  360 tablet    Refill:  2  . pravastatin (PRAVACHOL) 40 MG tablet    Sig: Take 1 tablet (40 mg total) by mouth daily.    Dispense:  90 tablet    Refill:  2    Return precautions advised.  Tana Conch, MD

## 2018-03-13 ENCOUNTER — Encounter: Payer: Self-pay | Admitting: Family Medicine

## 2018-03-14 ENCOUNTER — Other Ambulatory Visit: Payer: Self-pay

## 2018-03-14 MED ORDER — BLOOD GLUCOSE METER KIT: PACK | 0 refills | Status: DC

## 2018-03-29 ENCOUNTER — Encounter: Payer: Self-pay | Admitting: Sports Medicine

## 2018-04-12 MED FILL — GLIMEPIRIDE 4 MG TABLET: 4 | 90 days supply | Qty: 135 | Fill #0

## 2018-04-12 MED FILL — COMBIGAN EYE DROPS: 0.2-0.5 | 30 days supply | Qty: 5 | Fill #6

## 2018-04-12 MED FILL — BENAZEPRIL HCL 40 MG TABLET: 40 | 90 days supply | Qty: 90 | Fill #1

## 2018-04-20 ENCOUNTER — Encounter: Payer: Self-pay | Admitting: Family Medicine

## 2018-05-08 ENCOUNTER — Encounter: Payer: Self-pay | Admitting: Family Medicine

## 2018-05-09 ENCOUNTER — Ambulatory Visit (INDEPENDENT_AMBULATORY_CARE_PROVIDER_SITE_OTHER): Payer: No Typology Code available for payment source

## 2018-05-09 ENCOUNTER — Ambulatory Visit (INDEPENDENT_AMBULATORY_CARE_PROVIDER_SITE_OTHER): Payer: No Typology Code available for payment source | Admitting: Sports Medicine

## 2018-05-09 ENCOUNTER — Encounter: Payer: Self-pay | Admitting: Sports Medicine

## 2018-05-09 VITALS — BP 120/78 | HR 90 | Ht 68.0 in | Wt 179.8 lb

## 2018-05-09 DIAGNOSIS — G8929 Other chronic pain: Secondary | ICD-10-CM

## 2018-05-09 DIAGNOSIS — M25512 Pain in left shoulder: Secondary | ICD-10-CM | POA: Diagnosis not present

## 2018-05-09 MED ORDER — TRAMADOL HCL 50 MG PO TABS: 50.0000 mg | ORAL_TABLET | Freq: Four times a day (QID) | ORAL | 0 refills | Status: DC | PRN

## 2018-05-09 MED FILL — traMADol HCL 50 MG TABS: 50 | 5 days supply | Qty: 20 | Fill #0

## 2018-05-09 NOTE — Progress Notes (Signed)
Veverly FellsMichael D. Delorise Shinerigby, DO  [facility] [phone]  Waylan BogaGill R Heitz - 62 y.o. male MRN [id number]  Date of birth: [birth year]  Visit Date: 05/09/2018  PCP: Shelva MajesticHunter, Stephen O, MD   Referred by: Shelva MajesticHunter, Stephen O, MD  Scribe(s) for today's visit: Christoper FabianMolly Weber, LAT, ATC  SUBJECTIVE:  Skeet SimmerGill R Cacho is here for Follow-up (B shoulder pain L>R) .    Notes from OV on 12/10/17: His R shoulder pain symptoms INITIALLY: Began about 3 months ago and MOI is unknown. No past injury to the shoulder.  Described as moderate-severe when flaring up (8/10) aching and stabbing, nonradiating Worsened with lying down at night, after doing home exercises. Pain is worse first thing in the morning Improved with movement Additional associated symptoms include: Pain is mostly anterior but he does feel it all around the shoulder. He denies clicking or popping. He reports decreased ROM during flare-up. He denies swelling/tightness. No neck pain.     At this time symptoms show no change compared to onset, possibly a little worse at times.  He has been taking Naproxen with minimal relief. He feels like a hot shower gets the shoulder moving a little faster/loosened up.   No recent XR of the shoulder or neck.   05/09/2018: Compared to the last office visit on 12/10/17, his previously described shoulder pain (L>R) symptoms are worsening over the past 2 months.  He notes that his R shoulder has improved overall and that his main c/o is his L shoulder.  He states that he has also noticed some numbness in his L thumb and index finger that is fairly constant. Current symptoms are mod-severe and are a mix between aching and sharp & are nonradiating.  Symptoms are aggravated with overhead ROM and also w/ functional IR behind the back. He has tried nitroglycerin patches in the past but is no longer using those.  He states that he tried the nitroglycerin patches on the L shoulder and  noticed some minor relief and has since stopped using those.  He will use Naproxen and Pennsaid prn.  R shoulder XR - 12/10/17   REVIEW OF SYSTEMS: Reports night time disturbances. Denies fevers, chills, or night sweats. Denies unexplained weight loss. Denies personal history of cancer. Denies changes in bowel or bladder habits. Denies recent unreported falls. Denies new or worsening dyspnea or wheezing. Denies headaches or dizziness.  Reports numbness, tingling or weakness  In the extremities - in the L hand, mainly thumb and index finger  Denies dizziness or presyncopal episodes Denies lower extremity edema    HISTORY & PERTINENT PRIOR DATA:  Prior History reviewed and updated per electronic medical record.  Significant/pertinent history, findings, studies include:  reports that he has never smoked. He has never used smokeless tobacco. Recent Labs    07/30/17 03/12/18 1113  HGBA1C 7.1 7.1*   No specialty comments available. No problems updated.  OBJECTIVE:  VS:  HT:5\' 8"  (172.7 cm)   WT:179 lb 12.8 oz (81.6 kg)  BMI:27.34    BP:120/78  HR:90bpm  TEMP: ( )  RESP:96 %   PHYSICAL EXAM: CONSTITUTIONAL: Well-developed, Well-nourished and In no acute distress Alert & appropriately interactive. and Not depressed or anxious appearing. Respiratory: No increased work of breathing.  Trachea Midline EYES: Pupils are equal., EOM intact without nystagmus. and No scleral icterus.  Upper EXTREMITY EXAM: Warm and well perfused NEURO: unremarkable  MSK Exam: Left shoulder has pain with overhead reach.  He has weakness with empty can testing and external rotation.  Positive Hawkins positive Neer's  PROCEDURES & DATA REVIEWED:  There is obtained today of the left shoulder reviewed that do show some AC joint arthropathy.  ASSESSMENT   1. Chronic left shoulder pain   2. Left shoulder pain, unspecified chronicity     PLAN:  MRI left shoulder indicated for failed operative  management including therapeutic exercises, persistent pain, weakness and concern for partial thickness supraspinatus tear.  If positive findings will refer directly to orthopedics for arthroscopic intervention otherwise we will plan on follow-up to discuss the results.  Ultram prescription sent in today.  Follow-up: Return for MRI review.      Please see additional documentation for Objective, Assessment and Plan sections. Pertinent additional documentation may be included in corresponding procedure notes, imaging studies, problem based documentation and patient instructions. Please see these sections of the encounter for additional information regarding this visit.  CMA/ATC served as Neurosurgeonscribe during this visit. History, Physical, and Plan performed by medical provider. Documentation and orders reviewed and attested to.      Andrena MewsMichael D Hulen Mandler, DO    [facility] Physician

## 2018-05-09 NOTE — Patient Instructions (Signed)
We are ordering an MRI for you today.  The imaging office will be calling you to schedule your appointment after we obtain authorization from your insurance company.   Please be sure you have signed up for MyChart so that we can get your results to you.  We will be in touch with you as soon as we can.  Please know, it can take up to 3-4 business days for the radiologist and Dr. Rigby to have time to review the results and determine the best appropriate action.  If there is something that appears to be surgical or needs a referral to other specialists we will let you know through MyChart or telephone.  Otherwise we will plan to schedule a follow up appointment with Dr. Rigby once we have the results.    [facility]: [phone]  

## 2018-05-21 MED FILL — COMBIGAN EYE DROPS: 0.2-0.5 | 30 days supply | Qty: 5 | Fill #7

## 2018-05-21 MED FILL — PRAVASTATIN NA 40 MG TAB: 40 | 90 days supply | Qty: 90 | Fill #0

## 2018-05-22 ENCOUNTER — Other Ambulatory Visit: Payer: Self-pay | Admitting: Sports Medicine

## 2018-05-23 MED FILL — traMADol HCL 50 MG TABS: 50 | 5 days supply | Qty: 30 | Fill #0

## 2018-05-23 NOTE — Telephone Encounter (Signed)
Last OV 05/09/18, last refill 05/09/18 # 20/0 refills. MRI scheduled for 05/29/18.

## 2018-05-29 ENCOUNTER — Ambulatory Visit
Admission: RE | Admit: 2018-05-29 | Discharge: 2018-05-29 | Disposition: A | Discharge status: Home / Self Care | Payer: No Typology Code available for payment source | Source: Ambulatory Visit | Attending: Sports Medicine | Admitting: Sports Medicine

## 2018-05-29 DIAGNOSIS — M25512 Pain in left shoulder: Secondary | ICD-10-CM

## 2018-06-02 ENCOUNTER — Encounter: Payer: Self-pay | Admitting: Sports Medicine

## 2018-06-06 ENCOUNTER — Ambulatory Visit: Payer: No Typology Code available for payment source | Admitting: Sports Medicine

## 2018-06-12 NOTE — Progress Notes (Signed)
Has appointment on 06/17/18.  Reassuring that no surgical findings.  Will likely benefit from US guided Corticosteroid +/- NITRO or consider PRP

## 2018-06-17 ENCOUNTER — Encounter: Payer: Self-pay | Admitting: Sports Medicine

## 2018-06-17 ENCOUNTER — Ambulatory Visit (INDEPENDENT_AMBULATORY_CARE_PROVIDER_SITE_OTHER): Payer: No Typology Code available for payment source | Admitting: Sports Medicine

## 2018-06-17 ENCOUNTER — Ambulatory Visit: Payer: Self-pay

## 2018-06-17 VITALS — BP 122/84 | HR 75 | Ht 68.0 in | Wt 180.4 lb

## 2018-06-17 DIAGNOSIS — M75102 Unspecified rotator cuff tear or rupture of left shoulder, not specified as traumatic: Secondary | ICD-10-CM

## 2018-06-17 DIAGNOSIS — M79642 Pain in left hand: Secondary | ICD-10-CM

## 2018-06-17 DIAGNOSIS — M25512 Pain in left shoulder: Secondary | ICD-10-CM | POA: Diagnosis not present

## 2018-06-17 DIAGNOSIS — M7550 Bursitis of unspecified shoulder: Secondary | ICD-10-CM

## 2018-06-17 DIAGNOSIS — G8929 Other chronic pain: Secondary | ICD-10-CM | POA: Diagnosis not present

## 2018-06-17 NOTE — Procedures (Signed)
PROCEDURE NOTE:  Ultrasound Guided: Injection: Left shoulder Images were obtained and interpreted by myself, Gaspar BiddingMichael Rigby, DO  Images have been saved and stored to PACS system. Images obtained on: GE S7 Ultrasound machine    ULTRASOUND FINDINGS:  Small subacromial bursa appreciated.  Thickening of the rotator cuff consistent with Tendinopathic changes.  AC joint arthropathy with positive mushroom sign but no pain with sono palpation.  Left median nerve was measured at the carpal tunnel measuring 0.10 cm.  DESCRIPTION OF PROCEDURE:  The patient's clinical condition is marked by substantial pain and/or significant functional disability. Other conservative therapy has not provided relief, is contraindicated, or not appropriate. There is a reasonable likelihood that injection will significantly improve the patient's pain and/or functional impairment.   After discussing the risks, benefits and expected outcomes of the injection and all questions were reviewed and answered, the patient wished to undergo the above named procedure.  Verbal consent was obtained.  The ultrasound was used to identify the target structure and adjacent neurovascular structures. The skin was then prepped in sterile fashion and the target structure was injected under direct visualization using sterile technique as below:  Single injection performed as below: PREP: Alcohol and Ethel Chloride APPROACH:Posteriolateral approach, single injection, 25g 1.5 in. INJECTATE: 2 cc 0.5% Marcaine and 2 cc 40mg /mL DepoMedrol ASPIRATE: None DRESSING: Band-Aid  Post procedural instructions including recommending icing and warning signs for infection were reviewed.    This procedure was well tolerated and there were no complications.   IMPRESSION: Succesful Ultrasound Guided: Injection

## 2018-06-17 NOTE — Progress Notes (Signed)
Chad FellsMichael D. Delorise Shinerigby, DO  [facility] [phone]  Waylan BogaGill R Couts - 62 y.o. male MRN [id number]  Date of birth: 12-12-1955  Visit Date: 06/17/2018  PCP: Shelva MajesticHunter, Stephen O, MD   Referred by: Shelva MajesticHunter, Stephen O, MD  Scribe(s) for today's visit: Stevenson ClinchBrandy Coleman, CMA  SUBJECTIVE:  Chad SimmerGill R Morton is here for Follow-up (L shoulder pain - MRI review) .   Notes from OV on 12/10/17: His R shoulder pain symptoms INITIALLY: Began about 3 months ago and MOI is unknown. No past injury to the shoulder.  Described as moderate-severe when flaring up (8/10) aching and stabbing, nonradiating Worsened with lying down at night, after doing home exercises. Pain is worse first thing in the morning Improved with movement Additional associated symptoms include: Pain is mostly anterior but he does feel it all around the shoulder. He denies clicking or popping. He reports decreased ROM during flare-up. He denies swelling/tightness. No neck pain.    At this time symptoms show no change compared to onset, possibly a little worse at times.  He has been taking Naproxen with minimal relief. He feels like a hot shower gets the shoulder moving a little faster/loosened up.  No recent XR of the shoulder or neck.   05/09/2018: Compared to the last office visit on 12/10/17, his previously described shoulder pain (L>R) symptoms are worsening over the past 2 months.  He notes that his R shoulder has improved overall and that his main c/o is his L shoulder.  He states that he has also noticed some numbness in his L thumb and index finger that is fairly constant. Current symptoms are mod-severe and are a mix between aching and sharp & are nonradiating.  Symptoms are aggravated with overhead ROM and also w/ functional IR behind the back. He has tried nitroglycerin patches in the past but is no longer using those.  He states that he tried the nitroglycerin patches on the L shoulder and  noticed some minor relief and has since stopped using those.  He will use Naproxen and Pennsaid prn. R shoulder XR - 12/10/17  06/17/2018: Compared to the last office visit, his previously described symptoms show no change. He does note constant n/t in the 1-3 fingers over the past 5 weeks ago so.  Current symptoms are moderate-severe & are radiating to the fingers (n/t). He denies shooting pains into the arm.  He has been taking Naproxen and using Pennsaid prn with minimal relief. He has been taking Tramadol at bedtime with some relief. He has noticed slight change in bowel movement since starting Tramadol.    REVIEW OF SYSTEMS: Reports night time disturbances - Tramadol helps with this, wakes up after about 6 hours. Denies fevers, chills, or night sweats. Denies unexplained weight loss. Denies personal history of cancer. Denies changes in bowel or bladder habits. Denies recent unreported falls. Denies new or worsening dyspnea or wheezing. Denies headaches or dizziness.  Reports numbness, tingling or weakness  In the extremities - in the L hand, mainly thumb and index finger  Denies dizziness or presyncopal episodes Denies lower extremity edema    HISTORY:  Prior history reviewed and updated per electronic medical record.  Social History   Occupational History  . Not on file  Tobacco Use  . Smoking status: Never Smoker  . Smokeless tobacco: Never Used  Substance and Sexual Activity  . Alcohol use: Yes    Alcohol/week: 10.0 - 12.0 standard drinks  Types: 10 - 12 Standard drinks or equivalent per week  . Drug use: No  . Sexual activity: Yes   Social History   Social History Narrative   Married. 3 kids. 4 soon to be 5 grandkids (first girl) in 2018.       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 Daughter works for [employer]- geography major      Hobbies: time with family, beach, football (season tickets to [employer])     DATA OBTAINED & REVIEWED:   Recent  Labs    07/30/17 03/12/18 1113  HGBA1C 7.1 7.1*   . MRI right shoulder 05/29/2018: 1. Rotator cuff tendinopathy without tear 2. Moderate AC joint arthritis 3. Subdeltoid bursitis. .   OBJECTIVE:  VS:  HT:5\' 8"  (172.7 cm)   WT:180 lb 6.4 oz (81.8 kg)  BMI:27.44    BP:122/84  HR:75bpm  TEMP: ( )  RESP:97 %   PHYSICAL EXAM: CONSTITUTIONAL: Well-developed, Well-nourished and In no acute distress PSYCHIATRIC: Alert & appropriately interactive. and Not depressed or anxious appearing. RESPIRATORY: No increased work of breathing and Trachea Midline EYES: Pupils are equal., EOM intact without nystagmus. and No scleral icterus.  VASCULAR EXAM: Warm and well perfused NEURO: unremarkable Normal associated myotomal distribution strength to manual muscle testing Normal sensation to light touch Normal and symmetric associated DTRs  MSK Exam: Left shoulder  Well aligned, no significant deformity. No overlying skin changes. No focal bony tenderness   RANGE OF MOTION & STRENGTH  Full overhead range of motion.  Pain with external rotation but this is minimal.   SPECIALITY TESTING:  No significant pain with Hawkins or Neer's but mildly uncomfortable.  Intrinsic rotator cuff strength is intact.  Mild pain with O'Brien's testing and speeds testing but this is minimal.  No focal tenderness to palpation over the [facility] joint.    Minimal pain with carpal tunnel compression test.  Slightly painful and symptomatic Tinel's at the wrist.    ASSESSMENT   1. Chronic left shoulder pain   2. Left shoulder pain, unspecified chronicity   3. Rotator cuff syndrome of left shoulder   4. Subacromial bursitis   5. Pain of left hand     PLAN:  Pertinent additional documentation may be included in corresponding procedure notes, imaging studies, problem based documentation and patient instructions.  Procedures:  . US Guided Injection per procedure note  Medications:  No orders of the defined types  were placed in this encounter.  Discussion/Instructions: No problem-specific Assessment & Plan notes found for this encounter.  . Reviewed the MRI in detail with him today. . Symptoms are most consistent with subacromial bursitis although he does have AC joint arthropathy.  No focal tenderness over this area.  Possible shoulder hand syndrome is contributing to the left hand symptoms but this may also represent mild carpal tunnel per MSK ultrasound.  Could consider injection if any lack of improvement . Discussed red flag symptoms that warrant earlier emergent evaluation and patient voices understanding. . Activity modifications and the importance of avoiding exacerbating activities (limiting pain to no more than a 4 / 10 during or following activity) recommended and discussed.  Follow-up:  . Return in about 4 weeks (around 07/15/2018).   . If any lack of improvement consider: further diagnostic evaluation with Plain film x-rays of the neck and/or nerve conduction studies.  . At follow up will plan to consider: Repeat knee injections as needed (Zilretta in April 2019) and/or carpal tunnel injection/further diagnostic evaluation  as above.     CMA/ATC served as Neurosurgeon during this visit. History, Physical, and Plan performed by medical provider. Documentation and orders reviewed and attested to.      Andrena Mews, DO    [facility] Physician

## 2018-06-17 NOTE — Patient Instructions (Addendum)

## 2018-06-21 ENCOUNTER — Encounter: Payer: Self-pay | Admitting: Sports Medicine

## 2018-06-26 MED FILL — COMBIGAN EYE DROPS: 0.2-0.5 | 30 days supply | Qty: 5 | Fill #8

## 2018-06-26 MED FILL — METFORMIN HCL ER 500 MG TAB: 500 | 90 days supply | Qty: 360 | Fill #0

## 2018-07-03 ENCOUNTER — Encounter: Payer: Self-pay | Admitting: Sports Medicine

## 2018-07-03 ENCOUNTER — Other Ambulatory Visit: Payer: Self-pay | Admitting: Physical Therapy

## 2018-07-03 DIAGNOSIS — M79642 Pain in left hand: Secondary | ICD-10-CM

## 2018-07-05 ENCOUNTER — Encounter (INDEPENDENT_AMBULATORY_CARE_PROVIDER_SITE_OTHER): Payer: Self-pay | Admitting: Physical Medicine and Rehabilitation

## 2018-07-05 ENCOUNTER — Ambulatory Visit (INDEPENDENT_AMBULATORY_CARE_PROVIDER_SITE_OTHER): Payer: No Typology Code available for payment source | Admitting: Physical Medicine and Rehabilitation

## 2018-07-05 ENCOUNTER — Encounter

## 2018-07-05 DIAGNOSIS — R202 Paresthesia of skin: Secondary | ICD-10-CM

## 2018-07-05 NOTE — Progress Notes (Signed)
  Numeric Pain Rating Scale and Functional Assessment Average Pain 1   In the last MONTH (on 0-10 scale) has pain interfered with the following?  1. General activity like being  able to carry out your everyday physical activities such as walking, climbing stairs, carrying groceries, or moving a chair?  Rating(2)     

## 2018-07-08 NOTE — Telephone Encounter (Signed)
It appears that your test showed that you have moderate carpal tunnel syndrome.  This is something that an injection can often correct and I am happy to have you schedule a follow up at your convenience to have this done.  You can occasionally have some numbness and weakness in your hand immediately following the injection so it would be a good idea to not have much on your schedule for the rest of the day after the injection.

## 2018-07-09 ENCOUNTER — Encounter: Payer: Self-pay | Admitting: Sports Medicine

## 2018-07-09 ENCOUNTER — Ambulatory Visit (INDEPENDENT_AMBULATORY_CARE_PROVIDER_SITE_OTHER): Payer: No Typology Code available for payment source | Admitting: Sports Medicine

## 2018-07-09 ENCOUNTER — Ambulatory Visit: Payer: Self-pay

## 2018-07-09 VITALS — BP 140/80 | HR 80 | Ht 68.0 in | Wt 176.6 lb

## 2018-07-09 DIAGNOSIS — G5602 Carpal tunnel syndrome, left upper limb: Secondary | ICD-10-CM | POA: Diagnosis not present

## 2018-07-09 DIAGNOSIS — G5600 Carpal tunnel syndrome, unspecified upper limb: Secondary | ICD-10-CM | POA: Insufficient documentation

## 2018-07-09 DIAGNOSIS — M79642 Pain in left hand: Secondary | ICD-10-CM

## 2018-07-09 NOTE — Procedures (Signed)
PROCEDURE NOTE:  Ultrasound Guided: Injection: Left carpal tunnel hydro dissection Images were obtained and interpreted by myself, Gaspar Bidding, DO  Images have been saved and stored to PACS system. Images obtained on: GE S7 Ultrasound machine    ULTRASOUND FINDINGS:  Median nerve measures 0.12 cm on the left.  Similar appearance of the right but significantly thinner perineural tissue.  DESCRIPTION OF PROCEDURE:  The patient's clinical condition is marked by substantial pain and/or significant functional disability. Other conservative therapy has not provided relief, is contraindicated, or not appropriate. There is a reasonable likelihood that injection will significantly improve the patient's pain and/or functional impairment.   After discussing the risks, benefits and expected outcomes of the injection and all questions were reviewed and answered, the patient wished to undergo the above named procedure.  Verbal consent was obtained.  The ultrasound was used to identify the target structure and adjacent neurovascular structures. The skin was then prepped in sterile fashion and the target structure was injected under direct visualization using sterile technique as below:  Single injection performed as below: PREP: Alcohol and Ethel Chloride APPROACH:ulnar sided, single injection, 25g 1.5 in. INJECTATE: 0.5 cc 1% lidocaine, 0.5 cc 0.5% Marcaine and 0.5 cc 40mg /mL DepoMedrol ASPIRATE: None DRESSING: Band-Aid and Full wrist brace  Post procedural instructions including recommending icing and warning signs for infection were reviewed.    This procedure was well tolerated and there were no complications.   IMPRESSION: Succesful Ultrasound Guided: Injection

## 2018-07-09 NOTE — Patient Instructions (Addendum)

## 2018-07-09 NOTE — Assessment & Plan Note (Signed)
Ultrasound-guided hydrodissection of the median nerve performed today. Wrist brace especially to be worn at night recommended

## 2018-07-09 NOTE — Progress Notes (Signed)
Chad Morton. [facility] [phone]  Chad Morton - 62 y.o. male MRN [id number]  Date of birth: [birth year]  Visit Date: 07/09/2018  PCP: Chad Majestic, MD   Referred by: Chad Majestic, MD  Scribe(s) for today's visit: Chad Morton, CMA  SUBJECTIVE:  Chad Morton is here for Follow-up (L wrist pain) .   Notes from OV on 12/10/17: His R shoulder pain symptoms INITIALLY: Began about 3 months ago and MOI is unknown. No past injury to the shoulder.  Described as moderate-severe when flaring up (8/10) aching and stabbing, nonradiating Worsened with lying down at night, after doing home exercises. Pain is worse first thing in the morning Improved with movement Additional associated symptoms include: Pain is mostly anterior but he does feel it all around the shoulder. He denies clicking or popping. He reports decreased ROM during flare-up. He denies swelling/tightness. No neck pain.    At this time symptoms show no change compared to onset, possibly a little worse at times.  He has been taking Naproxen with minimal relief. He feels like a hot shower gets the shoulder moving a little faster/loosened up.  No recent XR of the shoulder or neck.   05/09/2018: Compared to the last office visit on 12/10/17, his previously described shoulder pain (L>R) symptoms are worsening over the past 2 months.  He notes that his R shoulder has improved overall and that his main c/o is his L shoulder.  He states that he has also noticed some numbness in his L thumb and index finger that is fairly constant. Current symptoms are mod-severe and are a mix between aching and sharp & are nonradiating.  Symptoms are aggravated with overhead ROM and also w/ functional IR behind the back. He has tried nitroglycerin patches in the past but is no longer using those.  He states that he tried the nitroglycerin patches on the L shoulder and noticed some  minor relief and has since stopped using those.  He will use Naproxen and Pennsaid prn. R shoulder XR - 12/10/17  06/17/2018: Compared to the last office visit, his previously described symptoms show no change. He does note constant n/t in the 1-3 fingers over the past 5 weeks ago so.  Current symptoms are moderate-severe & are radiating to the fingers (n/t). He denies shooting pains into the arm.  He has been taking Naproxen and using Pennsaid prn with minimal relief. He has been taking Tramadol at bedtime with some relief. He has noticed slight change in bowel movement since starting Tramadol.   07/09/2018: Compared to the last office visit on 06/17/18, his previously described L hand symptoms show no change.  Pt states that his 1st three fingers on his L hand continue to have constant N/T.  He notes that the swelling in his fingers has subsided.   Pt had a NCV test performed by Dr. Alvester Morton on 07/05/18 and is here to discuss those results. Current symptoms are moderate & are nonradiating He has been taking Naproxen prn but has stopped using the Pennsaid and the Tramadol.    REVIEW OF SYSTEMS: Denies night time disturbances Denies fevers, chills, or night sweats. Denies unexplained weight loss. Denies personal history of cancer. Denies changes in bowel or bladder habits. Denies recent unreported falls. Denies new or worsening dyspnea or wheezing. Denies headaches or dizziness.  Reports numbness, tingling or weakness  In the extremities - in the L hand,  mainly thumb and index finger  Denies dizziness or presyncopal episodes Denies lower extremity edema    HISTORY:  Prior history reviewed and updated per electronic medical record.  Social History   Occupational History  . Not on file  Tobacco Use  . Smoking status: Never Smoker  . Smokeless tobacco: Never Used  Substance and Sexual Activity  . Alcohol use: Yes    Alcohol/week: 10.0 - 12.0 standard drinks    Types: 10 - 12 Standard  drinks or equivalent per week  . Drug use: No  . Sexual activity: Yes   Social History   Social History Narrative   Married. 3 kids. 4 soon to be 5 grandkids (first girl) in 2018.       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 Daughter works for [employer]- geography major      Hobbies: time with family, beach, football (season tickets to [employer])     DATA OBTAINED & REVIEWED:   Recent Labs    07/30/17 03/12/18 1113  HGBA1C 7.1 7.1*   . MRI right shoulder 05/29/2018: 1. Rotator cuff tendinopathy without tear 2. Moderate AC joint arthritis 3. Subdeltoid bursitis. . Left arm EMGs on 07/05/2018: Moderate carpal tunnel.  Nerve cross-sectional area measurement on MSK ultrasound 0.12 cm with perineural thickening.   OBJECTIVE:  VS:  HT:5\' 8"  (172.7 cm)   WT:176 lb 9.6 oz (80.1 kg)  BMI:26.86    BP:140/80  HR:80bpm  TEMP: ( )  RESP:98 %   PHYSICAL EXAM: CONSTITUTIONAL: Well-developed, Well-nourished and In no acute distress PSYCHIATRIC: Alert & appropriately interactive. and Not depressed or anxious appearing. RESPIRATORY: No increased work of breathing and Trachea Midline EYES: Pupils are equal., EOM intact without nystagmus. and No scleral icterus.  VASCULAR EXAM: Warm and well perfused NEURO: unremarkable Normal associated myotomal distribution strength to manual muscle testing Normal sensation to light touch Normal and symmetric associated DTRs  MSK Exam: Left wrist  . Well aligned, no significant deformity. . No focal bony tenderness . No overlying skin changes.   RANGE OF MOTION & STRENGTH  . Good wrist range of motion.   SPECIALITY TESTING:  . Slightly positive Phalen's test and Tinel's test.    ASSESSMENT   1. Pain of left hand   2. Carpal tunnel syndrome of left wrist     PLAN:  Pertinent additional documentation may be included in corresponding procedure notes, imaging studies, problem based documentation and patient  instructions.  Procedures:  . US Guided Injection per procedure note  Medications:  No orders of the defined types were placed in this encounter.  Discussion/Instructions: Carpal tunnel syndrome Ultrasound-guided hydrodissection of the median nerve performed today. Wrist brace especially to be worn at night recommended  . Brace/supportive device provided per AVS. Wrist brace . Discussed red flag symptoms that warrant earlier emergent evaluation and patient voices understanding. . Activity modifications and the importance of avoiding exacerbating activities (limiting pain to no more than a 4 / 10 during or following activity) recommended and discussed.  Follow-up:  . Return in about 6 weeks (around 08/20/2018).    . At follow up will plan to consider: Repeat knee injections as needed (Zilretta in April 2019) and/or repeat MSK ultrasound of the median nerve     CMA/ATC served as scribe during this visit. History, Physical, and Plan performed by medical provider. Documentation and orders reviewed and attested to.      Andrena Mews, DO    [facility]  Physician

## 2018-07-11 NOTE — Progress Notes (Signed)
Chad Morton - 62 y.o. male MRN [id number]  Date of birth: 09-09-1956  Office Visit Note: Visit Date: 07/05/2018 PCP: [name], MD Referred by: [name], MD  Subjective: Chief Complaint  Patient presents with  . Left Hand - Numbness   HPI:  Chad Morton is a 62 y.o. male who comes in today At the request of Dr. Gaspar Bidding for electrodiagnostic study of the left upper limb.  He has been followed by Dr. Berline Chough for bilateral shoulder pain and worsening left shoulder pain but also now with tingling numbness in the first 3 digits on the left hand.  Interestingly he reports that inactivity makes his symptoms more prominent.  He gets a constant tingling.  He has not noted worsening with activity or position.  He does get some nocturnal complaints.  He is right-hand dominant.  He denies any right-sided complaints.  He denies any frank radicular symptoms.  He has no prior electrodiagnostic study.  He does have type 2 diabetes.  He is non-insulin-dependent.  His last hemoglobin A1c was 7.1.  ROS Otherwise per HPI.  Assessment & Plan: Visit Diagnoses:  1. Paresthesia of skin     Plan: Impression: The above electrodiagnostic study is ABNORMAL and reveals evidence of a moderate to severe left median nerve entrapment at the wrist (carpal tunnel syndrome) affecting sensory and motor components.   There is no significant electrodiagnostic evidence of any other focal nerve entrapment, brachial plexopathy or cervical radiculopathy.  There is mild evidence of potential underlying polyneuropathy.  This however could be temperature artifact.  A one limb electrodiagnostic study would not be diagnostic.   As you know, this particular electrodiagnostic study cannot rule out chemical radiculitis or sensory only radiculopathy.  Recommendations: 1.  Follow-up with referring physician. 2.  Continue current management of symptoms. 3.  Continue use of resting splint at night-time and as  needed during the day. 4.  Suggest surgical evaluation versus ultrasound procedure.   Meds & Orders: No orders of the defined types were placed in this encounter.   Orders Placed This Encounter  Procedures  . NCV with EMG (electromyography)    Follow-up: Return for Gaspar Bidding, DO.   Procedures: No procedures performed  EMG & NCV Findings: Evaluation of the left median motor nerve showed prolonged distal onset latency (5.6 ms) and decreased conduction velocity (Elbow-Wrist, 46 m/s).  The left median (across palm) sensory nerve showed no response (Wrist) and no response (Palm).  The left ulnar sensory nerve showed prolonged distal peak latency (4.1 ms), reduced amplitude (14.8 V), and decreased conduction velocity (Wrist-5th Digit, 34 m/s).  All remaining nerves (as indicated in the following tables) were within normal limits.    All examined muscles (as indicated in the following table) showed no evidence of electrical instability.    Impression: The above electrodiagnostic study is ABNORMAL and reveals evidence of a moderate to severe left median nerve entrapment at the wrist (carpal tunnel syndrome) affecting sensory and motor components.   There is no significant electrodiagnostic evidence of any other focal nerve entrapment, brachial plexopathy or cervical radiculopathy.  There is mild evidence of potential underlying polyneuropathy.  This however could be temperature artifact.  A one limb electrodiagnostic study would not be diagnostic.   As you know, this particular electrodiagnostic study cannot rule out chemical radiculitis or sensory only radiculopathy.  Recommendations: 1.  Follow-up with referring physician. 2.  Continue current management of symptoms. 3.  Continue use of  resting splint at night-time and as needed during the day. 4.  Suggest surgical evaluation versus ultrasound procedure.   ___________________________ Naaman Plummer [facility] Board Certified, American Board  of Physical Medicine and Rehabilitation    Nerve Conduction Studies Anti Sensory Summary Table   Stim Site NR Peak (ms) Norm Peak (ms) P-T Amp (V) Norm P-T Amp Site1 Site2 Delta-P (ms) Dist (cm) Vel (m/s) Norm Vel (m/s)  Left Median Acr Palm Anti Sensory (2nd Digit)  32.2C  Wrist *NR  <3.6  >10 Wrist Palm  0.0    Palm *NR  <2.0          Left Radial Anti Sensory (Base 1st Digit)  34C  Wrist    2.4 <3.1 16.6  Wrist Base 1st Digit 2.4 0.0    Left Ulnar Anti Sensory (5th Digit)  32.6C  Wrist    *4.1 <3.7 *14.8 >15.0 Wrist 5th Digit 4.1 14.0 *34 >38   Motor Summary Table   Stim Site NR Onset (ms) Norm Onset (ms) O-P Amp (mV) Norm O-P Amp Site1 Site2 Delta-0 (ms) Dist (cm) Vel (m/s) Norm Vel (m/s)  Left Median Motor (Abd Poll Brev)  34.1C  Wrist    *5.6 <4.2 5.6 >5 Elbow Wrist 4.9 22.5 *46 >50  Elbow    10.5  5.5         Left Ulnar Motor (Abd Dig Min)  33.6C  Wrist    3.1 <4.2 8.2 >3 B Elbow Wrist 4.0 22.5 56 >53  B Elbow    7.1  6.9  A Elbow B Elbow 1.7 9.5 56 >53  A Elbow    8.8  6.5          EMG   Side Muscle Nerve Root Ins Act Fibs Psw Amp Dur Poly Recrt Int Dennie Bible Comment  Left Abd Poll Brev Median C8-T1 Nml Nml Nml Nml Nml 0 Nml Nml   Left 1stDorInt Ulnar C8-T1 Nml Nml Nml Nml Nml 0 Nml Nml   Left PronatorTeres Median C6-7 Nml Nml Nml Nml Nml 0 Nml Nml   Left Biceps Musculocut C5-6 Nml Nml Nml Nml Nml 0 Nml Nml   Left Deltoid Axillary C5-6 Nml Nml Nml Nml Nml 0 Nml Nml     Nerve Conduction Studies Anti Sensory Left/Right Comparison   Stim Site L Lat (ms) R Lat (ms) L-R Lat (ms) L Amp (V) R Amp (V) L-R Amp (%) Site1 Site2 L Vel (m/s) R Vel (m/s) L-R Vel (m/s)  Median Acr Palm Anti Sensory (2nd Digit)  32.2C  Wrist       Wrist Palm     Palm             Radial Anti Sensory (Base 1st Digit)  34C  Wrist 2.4   16.6   Wrist Base 1st Digit     Ulnar Anti Sensory (5th Digit)  32.6C  Wrist *4.1   *14.8   Wrist 5th Digit *34     Motor Left/Right Comparison   Stim Site  L Lat (ms) R Lat (ms) L-R Lat (ms) L Amp (mV) R Amp (mV) L-R Amp (%) Site1 Site2 L Vel (m/s) R Vel (m/s) L-R Vel (m/s)  Median Motor (Abd Poll Brev)  34.1C  Wrist *5.6   5.6   Elbow Wrist *46    Elbow 10.5   5.5         Ulnar Motor (Abd Dig Min)  33.6C  Wrist 3.1   8.2   B Elbow Wrist 56  B Elbow 7.1   6.9   A Elbow B Elbow 56    A Elbow 8.8   6.5            Waveforms:            Clinical History: No specialty comments available.     Objective:  VS:  HT:    WT:   BMI:     BP:   HR: bpm  TEMP: ( )  RESP:  Physical Exam  Constitutional: He is oriented to person, place, and time.  Musculoskeletal: He exhibits no edema or tenderness.  Inspection reveals no atrophy of the bilateral APB or FDI or hand intrinsics. There is no swelling, color changes, allodynia or dystrophic changes. There is 5 out of 5 strength in the bilateral wrist extension, finger abduction and long finger flexion. There is decreased sensation to light touch in the median nerve distribution on the left.  . There is a positive Phalen's test on the left. There is a negative Hoffmann's test bilaterally.  Neurological: He is alert and oriented to person, place, and time. He exhibits normal muscle tone. Coordination normal.  Skin: Skin is warm and dry. No rash noted. No erythema.    Ortho Exam Imaging: No results found.

## 2018-07-11 NOTE — Procedures (Signed)
EMG & NCV Findings: Evaluation of the left median motor nerve showed prolonged distal onset latency (5.6 ms) and decreased conduction velocity (Elbow-Wrist, 46 m/s).  The left median (across palm) sensory nerve showed no response (Wrist) and no response (Palm).  The left ulnar sensory nerve showed prolonged distal peak latency (4.1 ms), reduced amplitude (14.8 V), and decreased conduction velocity (Wrist-5th Digit, 34 m/s).  All remaining nerves (as indicated in the following tables) were within normal limits.    All examined muscles (as indicated in the following table) showed no evidence of electrical instability.    Impression: The above electrodiagnostic study is ABNORMAL and reveals evidence of a moderate to severe left median nerve entrapment at the wrist (carpal tunnel syndrome) affecting sensory and motor components.   There is no significant electrodiagnostic evidence of any other focal nerve entrapment, brachial plexopathy or cervical radiculopathy.  There is mild evidence of potential underlying polyneuropathy.  This however could be temperature artifact.  A one limb electrodiagnostic study would not be diagnostic.   As you know, this particular electrodiagnostic study cannot rule out chemical radiculitis or sensory only radiculopathy.  Recommendations: 1.  Follow-up with referring physician. 2.  Continue current management of symptoms. 3.  Continue use of resting splint at night-time and as needed during the day. 4.  Suggest surgical evaluation versus ultrasound procedure.   ___________________________ Naaman Plummer [facility] Board Certified, American Board of Physical Medicine and Rehabilitation    Nerve Conduction Studies Anti Sensory Summary Table   Stim Site NR Peak (ms) Norm Peak (ms) P-T Amp (V) Norm P-T Amp Site1 Site2 Delta-P (ms) Dist (cm) Vel (m/s) Norm Vel (m/s)  Left Median Acr Palm Anti Sensory (2nd Digit)  32.2C  Wrist *NR  <3.6  >10 Wrist Palm  0.0    Palm *NR   <2.0          Left Radial Anti Sensory (Base 1st Digit)  34C  Wrist    2.4 <3.1 16.6  Wrist Base 1st Digit 2.4 0.0    Left Ulnar Anti Sensory (5th Digit)  32.6C  Wrist    *4.1 <3.7 *14.8 >15.0 Wrist 5th Digit 4.1 14.0 *34 >38   Motor Summary Table   Stim Site NR Onset (ms) Norm Onset (ms) O-P Amp (mV) Norm O-P Amp Site1 Site2 Delta-0 (ms) Dist (cm) Vel (m/s) Norm Vel (m/s)  Left Median Motor (Abd Poll Brev)  34.1C  Wrist    *5.6 <4.2 5.6 >5 Elbow Wrist 4.9 22.5 *46 >50  Elbow    10.5  5.5         Left Ulnar Motor (Abd Dig Min)  33.6C  Wrist    3.1 <4.2 8.2 >3 B Elbow Wrist 4.0 22.5 56 >53  B Elbow    7.1  6.9  A Elbow B Elbow 1.7 9.5 56 >53  A Elbow    8.8  6.5          EMG   Side Muscle Nerve Root Ins Act Fibs Psw Amp Dur Poly Recrt Int Dennie Bible Comment  Left Abd Poll Brev Median C8-T1 Nml Nml Nml Nml Nml 0 Nml Nml   Left 1stDorInt Ulnar C8-T1 Nml Nml Nml Nml Nml 0 Nml Nml   Left PronatorTeres Median C6-7 Nml Nml Nml Nml Nml 0 Nml Nml   Left Biceps Musculocut C5-6 Nml Nml Nml Nml Nml 0 Nml Nml   Left Deltoid Axillary C5-6 Nml Nml Nml Nml Nml 0 Nml Nml     Nerve  Conduction Studies Anti Sensory Left/Right Comparison   Stim Site L Lat (ms) R Lat (ms) L-R Lat (ms) L Amp (V) R Amp (V) L-R Amp (%) Site1 Site2 L Vel (m/s) R Vel (m/s) L-R Vel (m/s)  Median Acr Palm Anti Sensory (2nd Digit)  32.2C  Wrist       Wrist Palm     Palm             Radial Anti Sensory (Base 1st Digit)  34C  Wrist 2.4   16.6   Wrist Base 1st Digit     Ulnar Anti Sensory (5th Digit)  32.6C  Wrist *4.1   *14.8   Wrist 5th Digit *34     Motor Left/Right Comparison   Stim Site L Lat (ms) R Lat (ms) L-R Lat (ms) L Amp (mV) R Amp (mV) L-R Amp (%) Site1 Site2 L Vel (m/s) R Vel (m/s) L-R Vel (m/s)  Median Motor (Abd Poll Brev)  34.1C  Wrist *5.6   5.6   Elbow Wrist *46    Elbow 10.5   5.5         Ulnar Motor (Abd Dig Min)  33.6C  Wrist 3.1   8.2   B Elbow Wrist 56    B Elbow 7.1   6.9   A Elbow B Elbow  56    A Elbow 8.8   6.5            Waveforms:

## 2018-07-16 MED FILL — BENAZEPRIL HCL 40 MG TABLET: 40 | 90 days supply | Qty: 90 | Fill #0

## 2018-07-16 MED FILL — GLIMEPIRIDE 4 MG TABLET: 4 | 90 days supply | Qty: 135 | Fill #1

## 2018-07-18 MED FILL — COMBIGAN EYE DROPS: 0.2-0.5 | 30 days supply | Qty: 5 | Fill #9

## 2018-07-23 MED FILL — ACCU-CHEK GUIDE TEST STRIP: 90 days supply | Qty: 100 | Fill #0

## 2018-07-23 MED FILL — ACCU-CHEK FASTCLIX LANCETS: 90 days supply | Qty: 102 | Fill #0

## 2018-07-24 ENCOUNTER — Encounter (INDEPENDENT_AMBULATORY_CARE_PROVIDER_SITE_OTHER): Payer: Self-pay | Admitting: Physical Medicine and Rehabilitation

## 2018-08-08 ENCOUNTER — Encounter: Payer: Self-pay | Admitting: Family Medicine

## 2018-08-14 ENCOUNTER — Ambulatory Visit (INDEPENDENT_AMBULATORY_CARE_PROVIDER_SITE_OTHER): Payer: No Typology Code available for payment source | Admitting: Sports Medicine

## 2018-08-14 ENCOUNTER — Ambulatory Visit: Payer: Self-pay

## 2018-08-14 ENCOUNTER — Encounter: Payer: Self-pay | Admitting: Sports Medicine

## 2018-08-14 ENCOUNTER — Ambulatory Visit (INDEPENDENT_AMBULATORY_CARE_PROVIDER_SITE_OTHER): Payer: No Typology Code available for payment source | Admitting: Family Medicine

## 2018-08-14 ENCOUNTER — Encounter: Payer: Self-pay | Admitting: Family Medicine

## 2018-08-14 VITALS — BP 136/84 | HR 76 | Temp 98.2°F | Ht 68.0 in | Wt 177.0 lb

## 2018-08-14 VITALS — BP 140/88 | HR 76 | Ht 68.0 in | Wt 175.0 lb

## 2018-08-14 DIAGNOSIS — E785 Hyperlipidemia, unspecified: Secondary | ICD-10-CM | POA: Diagnosis not present

## 2018-08-14 DIAGNOSIS — E119 Type 2 diabetes mellitus without complications: Secondary | ICD-10-CM | POA: Diagnosis not present

## 2018-08-14 DIAGNOSIS — Z125 Encounter for screening for malignant neoplasm of prostate: Secondary | ICD-10-CM | POA: Diagnosis not present

## 2018-08-14 DIAGNOSIS — G5602 Carpal tunnel syndrome, left upper limb: Secondary | ICD-10-CM

## 2018-08-14 DIAGNOSIS — Z6826 Body mass index (BMI) 26.0-26.9, adult: Secondary | ICD-10-CM

## 2018-08-14 DIAGNOSIS — E1169 Type 2 diabetes mellitus with other specified complication: Secondary | ICD-10-CM

## 2018-08-14 DIAGNOSIS — I10 Essential (primary) hypertension: Secondary | ICD-10-CM

## 2018-08-14 DIAGNOSIS — M79642 Pain in left hand: Secondary | ICD-10-CM

## 2018-08-14 DIAGNOSIS — Z0000 Encounter for general adult medical examination without abnormal findings: Secondary | ICD-10-CM

## 2018-08-14 LAB — COMPREHENSIVE METABOLIC PANEL
ALBUMIN: 4.6 g/dL (ref 3.5–5.2)
ALT: 11 U/L (ref 0–53)
AST: 12 U/L (ref 0–37)
Alkaline Phosphatase: 44 U/L (ref 39–117)
BUN: 13 mg/dL (ref 6–23)
CALCIUM: 9.8 mg/dL (ref 8.4–10.5)
CHLORIDE: 101 meq/L (ref 96–112)
CO2: 26 meq/L (ref 19–32)
CREATININE: 0.94 mg/dL (ref 0.40–1.50)
GFR: 86.34 mL/min (ref >60.00)
Glucose, Bld: 161 mg/dL — ABNORMAL HIGH (ref 70–99)
POTASSIUM: 4.4 meq/L (ref 3.5–5.1)
Sodium: 137 mEq/L (ref 135–145)
Total Bilirubin: 0.8 mg/dL (ref 0.2–1.2)
Total Protein: 7.1 g/dL (ref 6.0–8.3)

## 2018-08-14 LAB — LIPID PANEL
CHOL/HDL RATIO: 2
CHOLESTEROL: 160 mg/dL (ref 0–200)
HDL: 67.7 mg/dL (ref >39.00)
LDL CALC: 73 mg/dL (ref 0–99)
NonHDL: 91.88
TRIGLYCERIDES: 94 mg/dL (ref 0.0–149.0)
VLDL: 18.8 mg/dL (ref 0.0–40.0)

## 2018-08-14 LAB — CBC
HCT: 44.7 % (ref 39.0–52.0)
Hemoglobin: 15.1 g/dL (ref 13.0–17.0)
MCHC: 33.8 g/dL (ref 30.0–36.0)
MCV: 90.4 fl (ref 78.0–100.0)
PLATELETS: 305 10*3/uL (ref 150.0–400.0)
RBC: 4.95 Mil/uL (ref 4.22–5.81)
RDW: 14.1 % (ref 11.5–15.5)
WBC: 6.5 10*3/uL (ref 4.0–10.5)

## 2018-08-14 LAB — PSA: PSA: 1.55 ng/mL (ref 0.10–4.00)

## 2018-08-14 LAB — HEMOGLOBIN A1C: HEMOGLOBIN A1C: 6.4 % (ref 4.6–6.5)

## 2018-08-14 NOTE — Assessment & Plan Note (Signed)
Diabetes- very mild poor control on Amaryl 6 mg and metformin 1 g twice daily.  Updated A1c looks great Lab Results  Component Value Date   HGBA1C 6.4 08/14/2018  if he continues to lose weight- we may need to reduce amaryl- he did have 2 low blood sugars over last 2 months- he will let me know if they become more frequent.

## 2018-08-14 NOTE — Patient Instructions (Addendum)
Please stop by lab before you go  Need to check blood pressure again  Hopefully A1c 7 or less given your weight loss-great job!

## 2018-08-14 NOTE — Procedures (Signed)
PROCEDURE NOTE:  Ultrasound Guided: Injection: Left carpal tunnel hydrodissection Images were obtained and interpreted by myself, Gaspar BiddingMichael Rigby, DO  Images have been saved and stored to PACS system. Images obtained on: GE S7 Ultrasound machine    ULTRASOUND FINDINGS:  Median nerve 0.10cm2  DESCRIPTION OF PROCEDURE:  The patient's clinical condition is marked by substantial pain and/or significant functional disability. Other conservative therapy has not provided relief, is contraindicated, or not appropriate. There is a reasonable likelihood that injection will significantly improve the patient's pain and/or functional impairment.   After discussing the risks, benefits and expected outcomes of the injection and all questions were reviewed and answered, the patient wished to undergo the above named procedure.  Verbal consent was obtained.  The ultrasound was used to identify the target structure and adjacent neurovascular structures. The skin was then prepped in sterile fashion and the target structure was injected under direct visualization using sterile technique as below:  Single injection performed as below: PREP: Alcohol and Ethel Chloride APPROACH:radial sided, single injection, 25g 1.5 in. INJECTATE: 0.5 cc 1% lidocaine, 0.5 cc 0.5% Marcaine and 0.5 cc 40mg /mL DepoMedrol ASPIRATE: None DRESSING: Band-Aid  Post procedural instructions including recommending icing and warning signs for infection were reviewed.    This procedure was well tolerated and there were no complications.   IMPRESSION: Succesful Ultrasound Guided: Injection

## 2018-08-14 NOTE — Assessment & Plan Note (Signed)
Hyperlipidemia- has been controlled on pravastatin 40 mg- looks great on updated lipid panel today

## 2018-08-14 NOTE — Assessment & Plan Note (Signed)
Hypertension-controlled on benazepril 40 mg on repeat-high normal though would prefer lower-continue to work on healthy eating and regular exercise BP Readings from Last 3 Encounters:  08/14/18 136/84  07/09/18 140/80

## 2018-08-14 NOTE — Progress Notes (Signed)
Phone: [phone]  Subjective:  Patient presents today for their annual physical. Chief complaint-noted.   See problem oriented charting- ROS- full  review of systems was completed and negative except for: Joint pain, numbness from carpal tunnel  The following were reviewed and entered/updated in epic: Past Medical History:  Diagnosis Date  . 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 Diabetes mellitus without complication ([place])   . Glaucoma    [facility]  . Hyperlipidemia   . Hypertension   . Post-operative nausea and vomiting    Patient Active Problem List   Diagnosis Date Noted  . 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 Type 2 diabetes mellitus without complication, without long-term current use of insulin ([place]) 05/30/2017    Priority: High  . Hypertension, essential 10/09/2017    Priority: Medium  . Hyperlipidemia associated with type 2 diabetes mellitus ([place]) 10/09/2017    Priority: Medium  . Glaucoma     Priority: Medium  . Degenerative arthritis of right knee 01/16/2018    Priority: Low  . Family history of colon cancer 10/09/2017    Priority: Low  . Carpal tunnel syndrome 07/09/2018   Past Surgical History:  Procedure Laterality Date  . COLONOSCOPY  2010  . KNEE ARTHROSCOPY W/ ACL RECONSTRUCTION Bilateral    both knees x1  . MANDIBLE FRACTURE SURGERY Bilateral   . TONSILECTOMY/ADENOIDECTOMY WITH MYRINGOTOMY     childhood    Family History  Problem Relation Age of Onset  . Myasthenia gravis Mother        complications from this  . Colon cancer Father        diagnosed 71- died within a year of diagnosis  . Diabetes Brother   . Hyperlipidemia Brother     Medications- reviewed and updated Current Outpatient Medications  Medication Sig Dispense Refill  . aspirin EC 81 MG tablet Take 1 tablet by mouth daily.    . benazepril (LOTENSIN) 40 MG tablet Take 1 tablet (40 mg total) by mouth daily. 90  tablet 2  . blood glucose meter kit and supplies Dispense based on patient and insurance preference. Use daily as directed to test blood sugar (E11.9). 1 each 0  . COMBIGAN 0.2-0.5 % ophthalmic solution Place 1 drop into both eyes 2 (two) times daily.  3  . Diclofenac Sodium (PENNSAID) 2 % SOLN Apply 1 pump twice daily. (Patient taking differently: as needed. Apply 1 pump as needed.) 112 g 3  . glimepiride (AMARYL) 4 MG tablet Take 1.5 tablets (6 mg total) by mouth daily. 135 tablet 2  . metFORMIN (GLUCOPHAGE-XR) 500 MG 24 hr tablet Take 2 tablets (1,000 mg total) by mouth 2 (two) times daily. 360 tablet 2  . pravastatin (PRAVACHOL) 40 MG tablet Take 1 tablet (40 mg total) by mouth daily. 90 tablet 2   No current facility-administered medications for this visit.     Allergies-reviewed and updated Allergies  Allergen Reactions  . Diphtheria Toxoid Swelling    Social History   Social History Narrative   Married. 3 kids. 4 soon to be 5 grandkids (first girl) in 2018.       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 Oropharynx normal Neck: no thyromegaly CV: RRR no murmurs rubs or gallops Lungs: CTAB no crackles, wheeze, rhonchi Abdomen: soft/nontender/nondistended/normal bowel sounds. No rebound or guarding.  Ext: no edema Skin: warm, dry Neuro: grossly normal, moves all extremities, PERRLA Rectal: normal tone, normal sized prostate, no masses or tenderness  Assessment/Plan:  62 y.o. male presenting for annual physical.  Health Maintenance counseling: 1. Anticipatory guidance: Patient counseled regarding regular dental exams -q6 months, eye exams - twice yearly due to glaucoma,   avoiding smoking and second hand smoke, limiting alcohol to 2 beverages per day.   2. Risk factor reduction:  Advised patient of need for regular exercise and diet rich and fruits and vegetables to reduce risk of heart attack and stroke. Exercise- doing rowing at [facility] about once a week for 1.5 hours- walks dog as well- 10 k steps most day. Diet-patient knows he could cut down on some foods including [place] - but does not want to do so. Down 12 lbs from February 2019.  Wt Readings from Last 3 Encounters:  08/14/18 175 lb (79.4 kg)  08/14/18 177 lb (80.3 kg)  07/09/18 176 lb 9.6 oz (80.1 kg)  3. Immunizations/screenings/ancillary studies-declines flu shot and Shingrix  Immunization History  Administered Date(s) Administered  . Pneumococcal Polysaccharide-23 10/09/2017  4. Prostate cancer screening- we will get baseline PSA today.  Rectal exam low risk 5. Colon cancer screening - colonoscopy 01/09/2018 with 5-year follow-up due to Dad's history colon cancer 6. Skin cancer screening- no dermatologist. advised regular sunscreen use. Denies worrisome, changing, or new skin lesions.  7.  Never smoker  Status of chronic or acute concerns  Joint pain in wrist from carpal tunnel- still some numbness on let side. Had seen Dr. Ernestina Patches in past- not bad enough he wants to do surgery.  He is interested in seeing Dr. Regis Bill like he scheduled a visit for this afternoon.  Type 2 diabetes mellitus without complication, without long-term current use of insulin (HCC) Diabetes- very mild poor control on Amaryl 6 mg and metformin 1 g twice daily.  Updated A1c looks great Lab Results  Component Value Date   HGBA1C 6.4 08/14/2018  if he continues to lose weight- we may need to reduce amaryl- he did have 2 low blood sugars over last 2 months- he will let me know if they become more frequent.    Hyperlipidemia associated with type 2 diabetes mellitus ([place]) Hyperlipidemia- has been controlled on  pravastatin 40 mg- looks great on updated lipid panel today   Hypertension, essential Hypertension-controlled on benazepril 40 mg on repeat-high normal though would prefer lower-continue to work on healthy eating and regular exercise BP Readings from Last 3 Encounters:  08/14/18 136/84  07/09/18 140/80    Future Appointments  Date Time Provider [place]  09/30/2018  9:00 AM Gerda Diss, DO [facility]   Advised 11-monthfollow-up verbally if A1c under 7  Lab/Order associations: Fasting  Preventative health care - Plan: CBC, Lipid panel, Comprehensive metabolic panel, PSA  Hyperlipidemia associated with type 2 diabetes mellitus ([facility] - Plan: CBC, Lipid panel, Comprehensive metabolic panel  Screening for prostate cancer  Type 2 diabetes mellitus without complication, without long-term current use of insulin ([place] - Plan: Hemoglobin A1c  Body mass index 26.0-26.9, adult  Hypertension, essential  Return precautions advised.  SGarret Reddish MD

## 2018-08-14 NOTE — Progress Notes (Signed)
Chad Morton. [facility] [phone]  Chad Morton - 62 y.o. male MRN [id number]  Date of birth: 1955/12/11  Visit Date: 08/14/2018  PCP: [name], MD   Referred by: [name], MD  Scribe(s) for today's visit: Christoper Fabian, LAT, ATC  SUBJECTIVE:  Chad Morton is here for Follow-up (L wrist and hand pain) .   Notes from OV on 12/10/17: His R shoulder pain symptoms INITIALLY: Began about 3 months ago and MOI is unknown. No past injury to the shoulder.  Described as moderate-severe when flaring up (8/10) aching and stabbing, nonradiating Worsened with lying down at night, after doing home exercises. Pain is worse first thing in the morning Improved with movement Additional associated symptoms include: Pain is mostly anterior but he does feel it all around the shoulder. He denies clicking or popping. He reports decreased ROM during flare-up. He denies swelling/tightness. No neck pain.    At this time symptoms show no change compared to onset, possibly a little worse at times.  He has been taking Naproxen with minimal relief. He feels like a hot shower gets the shoulder moving a little faster/loosened up.  No recent XR of the shoulder or neck.   05/09/2018: Compared to the last office visit on 12/10/17, his previously described shoulder pain (L>R) symptoms are worsening over the past 2 months.  He notes that his R shoulder has improved overall and that his main c/o is his L shoulder.  He states that he has also noticed some numbness in his L thumb and index finger that is fairly constant. Current symptoms are mod-severe and are a mix between aching and sharp & are nonradiating.  Symptoms are aggravated with overhead ROM and also w/ functional IR behind the back. He has tried nitroglycerin patches in the past but is no longer using those.  He states that he tried the nitroglycerin patches on the L shoulder and  noticed some minor relief and has since stopped using those.  He will use Naproxen and Pennsaid prn. R shoulder XR - 12/10/17  06/17/2018: Compared to the last office visit, his previously described symptoms show no change. He does note constant n/t in the 1-3 fingers over the past 5 weeks ago so.  Current symptoms are moderate-severe & are radiating to the fingers (n/t). He denies shooting pains into the arm.  He has been taking Naproxen and using Pennsaid prn with minimal relief. He has been taking Tramadol at bedtime with some relief. He has noticed slight change in bowel movement since starting Tramadol.   07/09/2018: Compared to the last office visit on 06/17/18, his previously described L hand symptoms show no change.  Pt states that his 1st three fingers on his L hand continue to have constant N/T.  He notes that the swelling in his fingers has subsided.   Pt had a NCV test performed by Dr. [name] on 07/05/18 and is here to discuss those results. Current symptoms are moderate & are nonradiating He has been taking Naproxen prn but has stopped using the Pennsaid and the Tramadol.   08/14/2018: Compared to the last office visit on 07/09/18, his previously described L hand and wrist symptoms are improving but he still has the N/T in his L first 3 fingers that is constant in nature. Current symptoms are mild-mod & are nonradiating He has not been taking any medications specifically for his L hand/wrist..  He  had a hydrodissection/injection of his L wrist at his last visit.  He has been wearing his L wrist brace at night per Dr. Janeece Riggers recommendations.  REVIEW OF SYSTEMS: Denies night time disturbances Denies fevers, chills, or night sweats. Denies unexplained weight loss. Denies personal history of cancer. Denies changes in bowel or bladder habits. Denies recent unreported falls. Denies new or worsening dyspnea or wheezing. Denies headaches or dizziness.  Reports numbness, tingling or  weakness  In the extremities - in the L hand, mainly thumb and index finger  Denies dizziness or presyncopal episodes Denies lower extremity edema    HISTORY:  Prior history reviewed and updated per electronic medical record.  Social History   Occupational History  . Not on file  Tobacco Use  . Smoking status: Never Smoker  . Smokeless tobacco: Never Used  Substance and Sexual Activity  . Alcohol use: Yes    Alcohol/week: 10.0 - 12.0 standard drinks    Types: 10 - 12 Standard drinks or equivalent per week  . Drug use: No  . Sexual activity: Yes   Social History   Social History Narrative   Married. 3 kids. 4 soon to be 5 grandkids (first girl) in 2018.       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 MRI right shoulder 05/29/2018: 1. Rotator cuff tendinopathy without tear 2. Moderate AC joint arthritis 3. Subdeltoid bursitis. . Left arm EMGs on 07/05/2018: Moderate carpal tunnel.  Nerve cross-sectional area measurement on MSK ultrasound 0.12 cm with perineural thickening.   OBJECTIVE:  VS:  HT:5\' 8"  (172.7 cm)   WT:175 lb (79.4 kg)  BMI:26.61    BP:140/88  HR:76bpm  TEMP: ( )  RESP:98 %   PHYSICAL EXAM: CONSTITUTIONAL: Well-developed, Well-nourished and In no acute distress PSYCHIATRIC: Alert & appropriately interactive. and Not depressed or anxious appearing. RESPIRATORY: No increased work of breathing and Trachea Midline EYES: Pupils are equal., EOM intact without nystagmus. and No scleral icterus.  VASCULAR EXAM: Warm and well perfused NEURO: unremarkable  MSK Exam: Left wrist  . Well aligned, no significant deformity. . No focal bony tenderness . No overlying skin changes.   RANGE OF MOTION & STRENGTH  . Good wrist range of motion.   SPECIALITY TESTING:    . Slightly positive Phalen's test and Tinel's test.    ASSESSMENT   1. Pain of left hand   2. Carpal tunnel syndrome of left wrist     PLAN:  Pertinent additional documentation may be included in corresponding procedure notes, imaging studies, problem based documentation and patient instructions.  Procedures:  . US Guided Injection per procedure note  Medications:  No orders of the defined types were placed in this encounter.  Discussion/Instructions:  . Discussed red flag symptoms that warrant earlier emergent evaluation and patient voices understanding. . Activity modifications and the importance of avoiding exacerbating activities (limiting pain to no more than a 4 / 10 during or following activity) recommended and discussed.  Follow-up:  . Return in about 6 weeks (around 09/25/2018) for repeat clinical exam.   . At follow up will plan to consider: Repeat knee injections as needed ([place] in April 2019) and/or repeat MSK ultrasound of the median nerve     CMA/ATC  served as Neurosurgeonscribe during this visit. History, Physical, and Plan performed by medical provider. Documentation and orders reviewed and attested to.      Andrena MewsMichael D , DO    [facility]

## 2018-08-14 NOTE — Patient Instructions (Addendum)
You had an injection today.  Things to be aware of after injection are listed below: . You may experience no significant improvement or even a slight worsening in your symptoms during the first 24 to 48 hours.  After that we expect your symptoms to improve gradually over the next 2 weeks for the medicine to have its maximal effect.  You should continue to have improvement out to 6 weeks after your injection. . Dr. Abrar Bilton recommends icing the site of the injection for 20 minutes  1-2 times the day of your injection . You may shower but no swimming, tub bath or Jacuzzi for 24 hours. . If your bandage falls off this does not need to be replaced.  It is appropriate to remove the bandage after 4 hours. . You may resume light activities as tolerated unless otherwise directed per Dr. Donica Derouin during your visit  POSSIBLE STEROID SIDE EFFECTS:  Side effects from injectable steroids tend to be less than when taken orally however you may experience some of the symptoms listed below.  If experienced these should only last for a short period of time. Change in menstrual flow  Edema (swelling)  Increased appetite Skin flushing (redness)  Skin rash/acne  Thrush (oral) Yeast vaginitis    Increased sweating  Depression Increased blood glucose levels Cramping and leg/calf  Euphoria (feeling happy)  POSSIBLE PROCEDURE SIDE EFFECTS: The side effects of the injection are usually fairly minimal however if you may experience some of the following side effects that are usually self-limited and will is off on their own.  If you are concerned please feel free to call the office with questions:  Increased numbness or tingling  Nausea or vomiting  Swelling or bruising at the injection site   Please call our office if if you experience any of the following symptoms over the next week as these can be signs of infection:   Fever greater than 100.5F  Significant swelling at the injection site  Significant redness or drainage  from the injection site  If after 2 weeks you are continuing to have worsening symptoms please call our office to discuss what the next appropriate actions should be including the potential for a return office visit or other diagnostic testing.    

## 2018-08-27 MED FILL — COMBIGAN EYE DROPS: 0.2-0.5 | 25 days supply | Qty: 5 | Fill #0

## 2018-08-27 MED FILL — PRAVASTATIN NA 40 MG TAB: 40 | 90 days supply | Qty: 90 | Fill #1

## 2018-09-10 ENCOUNTER — Encounter: Payer: Self-pay | Admitting: Family Medicine

## 2018-09-10 ENCOUNTER — Ambulatory Visit (INDEPENDENT_AMBULATORY_CARE_PROVIDER_SITE_OTHER): Payer: No Typology Code available for payment source | Admitting: Family Medicine

## 2018-09-10 VITALS — BP 124/78 | HR 86 | Temp 98.2°F | Ht 68.0 in | Wt 178.8 lb

## 2018-09-10 DIAGNOSIS — I10 Essential (primary) hypertension: Secondary | ICD-10-CM

## 2018-09-10 DIAGNOSIS — G5602 Carpal tunnel syndrome, left upper limb: Secondary | ICD-10-CM

## 2018-09-10 DIAGNOSIS — E119 Type 2 diabetes mellitus without complications: Secondary | ICD-10-CM

## 2018-09-10 NOTE — Patient Instructions (Addendum)
Blood pressure looks much better again today- not sure why it jumped up last visit.   We will call you within two weeks about your referral to [facility]. If you do not hear within 3 weeks, give us a call.   Since you have had some blood sugars into the 70s- lets try glimepiride at 4 mg instead- lets trial this. We may permanently reduce at next visit - lets schedule perhaps 4 months from today for follow up

## 2018-09-10 NOTE — Assessment & Plan Note (Signed)
S: Well controlled on metformin 1000 mg twice a day extended release and Amaryl 6 mg-perhaps overcontrolled CBGs-patient shows me a log of his blood sugars and he has several numbers down into the 70s. Lab Results  Component Value Date   HGBA1C 6.4 08/14/2018   HGBA1C 7.1 (H) 03/12/2018   HGBA1C 7.1 07/30/2017   A/P: Stable but perhaps overcontrolled- I do not mind him having a rare number in the 70s but he has had fair number in that range- will reduce to 4 mg of Amaryl and continue metformin 1000 mg extended release

## 2018-09-10 NOTE — Assessment & Plan Note (Signed)
s: controlled on benazepril 40 mg alone.  We had him back in for repeat given elevated blood pressure at sports medicine visit BP Readings from Last 3 Encounters:  09/10/18 124/78  08/14/18 140/88  08/14/18 136/84  A/P: We discussed blood pressure goal of <140/90. Continue current meds

## 2018-09-10 NOTE — Assessment & Plan Note (Signed)
S: Patient has had no improvement in his symptoms from his nondominant hand- (left hand) carpa- l tunnel after injection on 08/14/18- 2nd injection and neither has helped. Gets slight numbness in 1st 3 fingers and some pain with extension. Had EMG on 07/05/18 with Dr. Alvester MorinNewton A/P: Poor control despite 2 injections.  Although somewhat mild symptoms-patient states he does not want to live with these long-term.  He wants to sit down with orthopedics to discuss surgical options-will refer to [facility] as he had his nerve conduction studies there with Dr. Alvester MorinNewton.

## 2018-09-10 NOTE — Progress Notes (Signed)
Subjective:  Chad Morton is a 62 y.o. year old very pleasant male patient who presents for/with See problem oriented charting ROS-left hand numbness noted-occasional pain.  No chest pain or shortness of breath.  No edema.  Past Medical History-  Patient Active Problem List   Diagnosis Date Noted  . 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 Type 2 diabetes mellitus without complication, without long-term current use of insulin ([place]) 05/30/2017    Priority: High  . Hypertension, essential 10/09/2017    Priority: Medium  . Hyperlipidemia associated with type 2 diabetes mellitus ([place]) 10/09/2017    Priority: Medium  . Glaucoma     Priority: Medium  . Degenerative arthritis of right knee 01/16/2018    Priority: Low  . Family history of colon cancer 10/09/2017    Priority: Low  . Carpal tunnel syndrome 07/09/2018    Medications- reviewed and updated Current Outpatient Medications  Medication Sig Dispense Refill  . aspirin EC 81 MG tablet Take 1 tablet by mouth daily.    . benazepril (LOTENSIN) 40 MG tablet Take 1 tablet (40 mg total) by mouth daily. 90 tablet 2  . blood glucose meter kit and supplies Dispense based on patient and insurance preference. Use daily as directed to test blood sugar (E11.9). 1 each 0  . COMBIGAN 0.2-0.5 % ophthalmic solution Place 1 drop into both eyes 2 (two) times daily.  3  . Diclofenac Sodium (PENNSAID) 2 % SOLN Apply 1 pump twice daily. (Patient taking differently: as needed. Apply 1 pump as needed.) 112 g 3  . metFORMIN (GLUCOPHAGE-XR) 500 MG 24 hr tablet Take 2 tablets (1,000 mg total) by mouth 2 (two) times daily. 360 tablet 2  . pravastatin (PRAVACHOL) 40 MG tablet Take 1 tablet (40 mg total) by mouth daily. 90 tablet 2  . glimepiride (AMARYL) 4 MG tablet Take 1.5 tablets (6 mg total) by mouth daily. 135 tablet 2   No current facility-administered medications for this visit.     Objective: BP 124/78 (BP Location: Left  Arm, Patient Position: Sitting, Cuff Size: Large)   Pulse 86   Temp 98.2 F (36.8 C) (Oral)   Ht 5' 8"  (1.727 m)   Wt 178 lb 12.8 oz (81.1 kg)   SpO2 98%   BMI 27.19 kg/m  Gen: NAD, resting comfortably CV: RRR no murmurs rubs or gallops Lungs: CTAB no crackles, wheeze, rhonchi Ext: no edema Skin: warm, dry  Assessment/Plan:  Other notes: 1.Able to complete 4 mets of activity without chest pain or shortness of breath- he would not need further cardiac clearance prior to potential surgery   Hypertension, essential s: controlled on benazepril 40 mg alone.  We had him back in for repeat given elevated blood pressure at sports medicine visit BP Readings from Last 3 Encounters:  09/10/18 124/78  08/14/18 140/88  08/14/18 136/84  A/P: We discussed blood pressure goal of <140/90. Continue current meds  Carpal tunnel syndrome S: Patient has had no improvement in his symptoms from his nondominant hand- (left hand) carpa- l tunnel after injection on 08/14/18- 2nd injection and neither has helped. Gets slight numbness in 1st 3 fingers and some pain with extension. Had EMG on 07/05/18 with Dr. Ernestina Patches A/P: Poor control despite 2 injections.  Although somewhat mild symptoms-patient states he does not want to live with these long-term.  He wants to sit down with orthopedics to discuss surgical options-will refer to [place] as he had his nerve  conduction studies there with Dr. Ernestina Patches.  Type 2 diabetes mellitus without complication, without long-term current use of insulin (HCC) S: Well controlled on metformin 1000 mg twice a day extended release and Amaryl 6 mg-perhaps overcontrolled CBGs-patient shows me a log of his blood sugars and he has several numbers down into the 70s. Lab Results  Component Value Date   HGBA1C 6.4 08/14/2018   HGBA1C 7.1 (H) 03/12/2018   HGBA1C 7.1 07/30/2017   A/P: Stable but perhaps overcontrolled- I do not mind him having a rare number in the 70s but he  has had fair number in that range- will reduce to 4 mg of Amaryl and continue metformin 1000 mg extended release We are not going to change his medication dosage on epic yet until we know A1c is reasonably controlled on regimen.  Future Appointments  Date Time Provider [place]  09/16/2018  2:30 PM Mcarthur Rossetti, MD [facility] None  01/08/2019  8:00 AM Marin Olp, MD [facility]  18-monthvisit with me- I asked patient to cancel his visit with Dr. [facility] is going to discuss next steps with him.  Lab/Order associations: Carpal tunnel syndrome of left wrist - Plan: Ambulatory referral to Orthopedics  Hypertension, essential  Type 2 diabetes mellitus without complication, without long-term current use of insulin ([place]  Return precautions advised.  SGarret Reddish MD

## 2018-09-16 ENCOUNTER — Ambulatory Visit (INDEPENDENT_AMBULATORY_CARE_PROVIDER_SITE_OTHER): Payer: No Typology Code available for payment source | Admitting: Orthopaedic Surgery

## 2018-09-16 ENCOUNTER — Encounter (INDEPENDENT_AMBULATORY_CARE_PROVIDER_SITE_OTHER): Payer: Self-pay | Admitting: Orthopaedic Surgery

## 2018-09-16 DIAGNOSIS — G5602 Carpal tunnel syndrome, left upper limb: Secondary | ICD-10-CM | POA: Insufficient documentation

## 2018-09-16 NOTE — Progress Notes (Signed)
Office Visit Note   Patient: Chad Morton R Chaidez           Date of Birth: April 07, 1956           MRN: [id number] Visit Date: 09/16/2018              Requested by: Shelva MajesticHunter, Stephen O, MD [address] [place], [place] [address] PCP: Shelva MajesticHunter, Stephen O, MD   Assessment & Plan: Visit Diagnoses:  1. Carpal tunnel syndrome, left upper limb     Plan: I did review his nerve conduction studies with him and his clinical exam findings.  These are consistent with moderate severe carpal tunnel syndrome.  I talked about the anatomy of the transverse carpal ligament and what surgery involves.  We had a long and thorough discussion about the risk minutes of surgery and the rationale behind proceeding with this in the near future given the moderate to severe nature of his disease.  All question concerns were answered and addressed.  We will work on getting this scheduled after the first of the year.  We will see him back in 2 weeks postoperative for suture removal.  Follow-Up Instructions: Return for 2 weeks post-op.   Orders:  No orders of the defined types were placed in this encounter.  No orders of the defined types were placed in this encounter.     Procedures: No procedures performed   Clinical Data: No additional findings.   Subjective: Chief Complaint  Patient presents with  . Left Hand - Pain  The patient comes in today for surgical evaluation for known moderate severe left carpal tunnel syndrome.  This was confirmed with both an ultrasound of the wrist as well as EMG-nerve conduction velocity studies.  Dr. Berline Choughigby performed the ultrasound and Dr. Alvester MorinNewton performed a nerve conduction studies.  The patient is a diabetic but is been under good control.  He reports numbness and tingling in his left thumb, index, and middle fingers.  It does wake him up at night.  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  He is very active individual.  HPI  Review of Systems He  currently denies any headache, chest pain, shortness of breath, fever, chills, nausea, vomiting  Objective: Vital Signs: There were no vitals taken for this visit.  Physical Exam He is alert and oriented x3 and in no acute distress Ortho Exam Examination of his left hand does show decreased sensation in the median nerve distribution.  He has slightly weak grip and pinch strength.  There is no muscle atrophy.  He does have a positive Phalen's and Tinel sign on the left side. Specialty Comments:  No specialty comments available.  Imaging: No results found.   PMFS History: Patient Active Problem List   Diagnosis Date Noted  . Carpal tunnel syndrome, left upper limb 09/16/2018  . Carpal tunnel syndrome 07/09/2018  . Degenerative arthritis of right knee 01/16/2018  . 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 Family history of colon cancer 10/09/2017  . Hypertension, essential 10/09/2017  . Hyperlipidemia associated with type 2 diabetes mellitus (HCC) 10/09/2017  . Glaucoma   . Type 2 diabetes mellitus without complication, without long-term current use of insulin (HCC) 05/30/2017   Past Medical History:  Diagnosis Date  . 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 Diabetes mellitus without complication (HCC)   . Glaucoma    [facility]  . Hyperlipidemia   .  Hypertension   . Post-operative nausea and vomiting     Family History  Problem Relation Age of Onset  . Myasthenia gravis Mother        complications from this  . Colon cancer Father        diagnosed 8149- died within a year of diagnosis  . Diabetes Brother   . Hyperlipidemia Brother     Past Surgical History:  Procedure Laterality Date  . COLONOSCOPY  2010  . KNEE ARTHROSCOPY W/ ACL RECONSTRUCTION Bilateral    both knees x1  . MANDIBLE FRACTURE SURGERY Bilateral   . TONSILECTOMY/ADENOIDECTOMY WITH MYRINGOTOMY     childhood   Social History   Occupational History  . Not on  file  Tobacco Use  . Smoking status: Never Smoker  . Smokeless tobacco: Never Used  Substance and Sexual Activity  . Alcohol use: Yes    Alcohol/week: 10.0 - 12.0 standard drinks    Types: 10 - 12 Standard drinks or equivalent per week  . Drug use: No  . Sexual activity: Yes

## 2018-09-30 ENCOUNTER — Ambulatory Visit: Payer: No Typology Code available for payment source | Admitting: Sports Medicine

## 2018-09-30 MED FILL — metFORMIN HCL ER 500 MG TB2: 500 | 90 days supply | Qty: 360 | Fill #1

## 2018-09-30 MED FILL — BENAZEPRIL HCL 40 MG TABLET: 40 | 90 days supply | Qty: 90 | Fill #1

## 2018-09-30 MED FILL — COMBIGAN EYE DROPS: 0.2-0.5 | 25 days supply | Qty: 5 | Fill #1

## 2018-10-02 ENCOUNTER — Other Ambulatory Visit (INDEPENDENT_AMBULATORY_CARE_PROVIDER_SITE_OTHER): Payer: Self-pay | Admitting: Physician Assistant

## 2018-10-03 ENCOUNTER — Encounter (HOSPITAL_BASED_OUTPATIENT_CLINIC_OR_DEPARTMENT_OTHER)
Admission: RE | Admit: 2018-10-03 | Discharge: 2018-10-03 | Disposition: A | Discharge status: Home / Self Care | Payer: No Typology Code available for payment source | Source: Ambulatory Visit | Attending: Orthopaedic Surgery | Admitting: Orthopaedic Surgery

## 2018-10-03 ENCOUNTER — Other Ambulatory Visit: Payer: Self-pay

## 2018-10-03 ENCOUNTER — Encounter (HOSPITAL_BASED_OUTPATIENT_CLINIC_OR_DEPARTMENT_OTHER): Payer: Self-pay | Admitting: *Deleted

## 2018-10-03 DIAGNOSIS — Z01818 Encounter for other preprocedural examination: Secondary | ICD-10-CM | POA: Diagnosis not present

## 2018-10-03 LAB — BASIC METABOLIC PANEL
Anion gap: 11 (ref 5–15)
BUN: 19 mg/dL (ref 8–23)
CALCIUM: 9.2 mg/dL (ref 8.9–10.3)
CO2: 23 mmol/L (ref 22–32)
Chloride: 102 mmol/L (ref 98–111)
Creatinine, Ser: 1.03 mg/dL (ref 0.61–1.24)
GFR calc Af Amer: >60 mL/min (ref >60)
GFR calc non Af Amer: >60 mL/min (ref >60)
Glucose, Bld: 132 mg/dL — ABNORMAL HIGH (ref 70–99)
Potassium: 4.4 mmol/L (ref 3.5–5.1)
Sodium: 136 mmol/L (ref 135–145)

## 2018-10-09 NOTE — Anesthesia Preprocedure Evaluation (Addendum)
Anesthesia Evaluation  Patient identified by MRN, date of birth, ID band Patient awake    Reviewed: Allergy & Precautions, H&P , NPO status , Patient's Chart, lab work & pertinent test results, reviewed documented beta blocker date and time   History of Anesthesia Complications (+) PONV and history of anesthetic complications  Airway Mallampati: II  TM Distance: >3 FB Neck ROM: full    Dental no notable dental hx.    Pulmonary neg pulmonary ROS,    Pulmonary exam normal breath sounds clear to auscultation       Cardiovascular Exercise Tolerance: Good hypertension, negative cardio ROS   Rhythm:regular Rate:Normal     Neuro/Psych negative neurological ROS  negative psych ROS   GI/Hepatic negative GI ROS, Neg liver ROS,   Endo/Other  negative endocrine ROSdiabetes, Type 2  Renal/GU negative Renal ROS  negative genitourinary   Musculoskeletal  (+) Arthritis , Osteoarthritis,    Abdominal   Peds  Hematology negative hematology ROS (+)   Anesthesia Other Findings   Reproductive/Obstetrics negative OB ROS                            Anesthesia Physical Anesthesia Plan  ASA: III  Anesthesia Plan: MAC   Post-op Pain Management:    Induction: Intravenous  PONV Risk Score and Plan: 3 and Ondansetron and Treatment may vary due to age or medical condition  Airway Management Planned: Mask, Natural Airway and Nasal Cannula  Additional Equipment:   Intra-op Plan:   Post-operative Plan:   Informed Consent: I have reviewed the patients History and Physical, chart, labs and discussed the procedure including the risks, benefits and alternatives for the proposed anesthesia with the patient or authorized representative who has indicated his/her understanding and acceptance.     Dental Advisory Given  Plan Discussed with: CRNA, Anesthesiologist and Surgeon  Anesthesia Plan Comments:         Anesthesia Quick Evaluation

## 2018-10-10 ENCOUNTER — Encounter (HOSPITAL_BASED_OUTPATIENT_CLINIC_OR_DEPARTMENT_OTHER): Payer: Self-pay

## 2018-10-10 ENCOUNTER — Ambulatory Visit (HOSPITAL_BASED_OUTPATIENT_CLINIC_OR_DEPARTMENT_OTHER)
Admission: RE | Admit: 2018-10-10 | Discharge: 2018-10-10 | Disposition: A | Discharge status: Home / Self Care | Payer: No Typology Code available for payment source | Attending: Orthopaedic Surgery | Admitting: Orthopaedic Surgery

## 2018-10-10 ENCOUNTER — Encounter (HOSPITAL_BASED_OUTPATIENT_CLINIC_OR_DEPARTMENT_OTHER)
Admission: RE | Disposition: A | Discharge status: Home / Self Care | Payer: Self-pay | Source: Home / Self Care | Attending: Orthopaedic Surgery

## 2018-10-10 ENCOUNTER — Other Ambulatory Visit: Payer: Self-pay

## 2018-10-10 ENCOUNTER — Ambulatory Visit (HOSPITAL_BASED_OUTPATIENT_CLINIC_OR_DEPARTMENT_OTHER): Payer: No Typology Code available for payment source | Admitting: Certified Registered"

## 2018-10-10 DIAGNOSIS — Z79899 Other long term (current) drug therapy: Secondary | ICD-10-CM | POA: Insufficient documentation

## 2018-10-10 DIAGNOSIS — E119 Type 2 diabetes mellitus without complications: Secondary | ICD-10-CM | POA: Diagnosis not present

## 2018-10-10 DIAGNOSIS — M1990 Unspecified osteoarthritis, unspecified site: Secondary | ICD-10-CM | POA: Insufficient documentation

## 2018-10-10 DIAGNOSIS — Z887 Allergy status to serum and vaccine status: Secondary | ICD-10-CM | POA: Insufficient documentation

## 2018-10-10 DIAGNOSIS — E785 Hyperlipidemia, unspecified: Secondary | ICD-10-CM | POA: Insufficient documentation

## 2018-10-10 DIAGNOSIS — G5602 Carpal tunnel syndrome, left upper limb: Secondary | ICD-10-CM | POA: Diagnosis not present

## 2018-10-10 DIAGNOSIS — Z7982 Long term (current) use of aspirin: Secondary | ICD-10-CM | POA: Insufficient documentation

## 2018-10-10 DIAGNOSIS — Z7984 Long term (current) use of oral hypoglycemic drugs: Secondary | ICD-10-CM | POA: Diagnosis not present

## 2018-10-10 DIAGNOSIS — H409 Unspecified glaucoma: Secondary | ICD-10-CM | POA: Diagnosis not present

## 2018-10-10 DIAGNOSIS — I10 Essential (primary) hypertension: Secondary | ICD-10-CM | POA: Diagnosis not present

## 2018-10-10 HISTORY — PX: CARPAL TUNNEL RELEASE: SHX101

## 2018-10-10 LAB — GLUCOSE, CAPILLARY
Glucose-Capillary: 187 mg/dL — ABNORMAL HIGH (ref 70–99)
Glucose-Capillary: 222 mg/dL — ABNORMAL HIGH (ref 70–99)

## 2018-10-10 SURGERY — CARPAL TUNNEL RELEASE
Anesthesia: Monitor Anesthesia Care | Site: Wrist | Laterality: Left

## 2018-10-10 MED ORDER — FENTANYL CITRATE (PF) 100 MCG/2ML IJ SOLN
INTRAMUSCULAR | Status: AC
  Filled 2018-10-10: qty 2

## 2018-10-10 MED ORDER — MIDAZOLAM HCL 2 MG/2ML IJ SOLN
1.0000 mg | INTRAMUSCULAR | Status: DC | PRN
  Administered 2018-10-10: 2 mg via INTRAVENOUS

## 2018-10-10 MED ORDER — MIDAZOLAM HCL 2 MG/2ML IJ SOLN
INTRAMUSCULAR | Status: AC
  Filled 2018-10-10: qty 2

## 2018-10-10 MED ORDER — FENTANYL CITRATE (PF) 100 MCG/2ML IJ SOLN
50.0000 ug | INTRAMUSCULAR | Status: DC | PRN
  Administered 2018-10-10: 100 ug via INTRAVENOUS

## 2018-10-10 MED ORDER — HYDROCODONE-ACETAMINOPHEN 5-325 MG PO TABS: 1.0000 | ORAL_TABLET | Freq: Four times a day (QID) | ORAL | 0 refills | Status: AC | PRN

## 2018-10-10 MED ORDER — LIDOCAINE HCL (PF) 1 % IJ SOLN
INTRAMUSCULAR | Status: DC | PRN
  Administered 2018-10-10: 10 mL

## 2018-10-10 MED ORDER — PHENYLEPHRINE 40 MCG/ML (10ML) SYRINGE FOR IV PUSH (FOR BLOOD PRESSURE SUPPORT)
PREFILLED_SYRINGE | INTRAVENOUS | Status: AC
  Filled 2018-10-10: qty 20

## 2018-10-10 MED ORDER — CEFAZOLIN SODIUM-DEXTROSE 2-4 GM/100ML-% IV SOLN
INTRAVENOUS | Status: AC
  Filled 2018-10-10: qty 100

## 2018-10-10 MED ORDER — ONDANSETRON HCL 4 MG/2ML IJ SOLN: 4.0000 mg | Freq: Once | INTRAMUSCULAR | Status: DC | PRN

## 2018-10-10 MED ORDER — CEFAZOLIN SODIUM-DEXTROSE 2-4 GM/100ML-% IV SOLN
2.0000 g | INTRAVENOUS | Status: AC
  Administered 2018-10-10: 2 g via INTRAVENOUS

## 2018-10-10 MED ORDER — ONDANSETRON HCL 4 MG/2ML IJ SOLN
INTRAMUSCULAR | Status: AC
  Filled 2018-10-10: qty 10

## 2018-10-10 MED ORDER — FENTANYL CITRATE (PF) 100 MCG/2ML IJ SOLN: 25.0000 ug | INTRAMUSCULAR | Status: DC | PRN

## 2018-10-10 MED ORDER — LIDOCAINE HCL (PF) 1 % IJ SOLN
INTRAMUSCULAR | Status: AC
  Filled 2018-10-10: qty 30

## 2018-10-10 MED ORDER — LIDOCAINE HCL (CARDIAC) PF 100 MG/5ML IV SOSY
PREFILLED_SYRINGE | INTRAVENOUS | Status: DC | PRN
  Administered 2018-10-10: 30 mg via INTRAVENOUS

## 2018-10-10 MED ORDER — PROPOFOL 500 MG/50ML IV EMUL
INTRAVENOUS | Status: AC
  Filled 2018-10-10: qty 100

## 2018-10-10 MED ORDER — LIDOCAINE 2% (20 MG/ML) 5 ML SYRINGE
INTRAMUSCULAR | Status: AC
  Filled 2018-10-10: qty 15

## 2018-10-10 MED ORDER — ACETAMINOPHEN 325 MG PO TABS: 325.0000 mg | ORAL_TABLET | ORAL | Status: DC | PRN

## 2018-10-10 MED ORDER — OXYCODONE HCL 5 MG/5ML PO SOLN: 5.0000 mg | Freq: Once | ORAL | Status: DC | PRN

## 2018-10-10 MED ORDER — BUPIVACAINE HCL (PF) 0.25 % IJ SOLN
INTRAMUSCULAR | Status: AC
  Filled 2018-10-10: qty 30

## 2018-10-10 MED ORDER — PROPOFOL 500 MG/50ML IV EMUL
INTRAVENOUS | Status: DC | PRN
  Administered 2018-10-10: 75 ug/kg/min via INTRAVENOUS

## 2018-10-10 MED ORDER — EPHEDRINE 5 MG/ML INJ
INTRAVENOUS | Status: AC
  Filled 2018-10-10: qty 10

## 2018-10-10 MED ORDER — ACETAMINOPHEN 160 MG/5ML PO SOLN: 325.0000 mg | ORAL | Status: DC | PRN

## 2018-10-10 MED ORDER — LIDOCAINE 2% (20 MG/ML) 5 ML SYRINGE
INTRAMUSCULAR | Status: AC
  Filled 2018-10-10: qty 5

## 2018-10-10 MED ORDER — OXYCODONE HCL 5 MG PO TABS: 5.0000 mg | ORAL_TABLET | Freq: Once | ORAL | Status: DC | PRN

## 2018-10-10 MED ORDER — SUGAMMADEX SODIUM 200 MG/2ML IV SOLN
INTRAVENOUS | Status: AC
  Filled 2018-10-10: qty 2

## 2018-10-10 MED ORDER — BUPIVACAINE HCL (PF) 0.25 % IJ SOLN
INTRAMUSCULAR | Status: DC | PRN
  Administered 2018-10-10: 5 mL

## 2018-10-10 MED ORDER — CHLORHEXIDINE GLUCONATE 4 % EX LIQD: 60.0000 mL | Freq: Once | CUTANEOUS | Status: DC

## 2018-10-10 MED ORDER — MEPERIDINE HCL 25 MG/ML IJ SOLN: 6.2500 mg | INTRAMUSCULAR | Status: DC | PRN

## 2018-10-10 MED ORDER — SCOPOLAMINE 1 MG/3DAYS TD PT72: 1.0000 | MEDICATED_PATCH | Freq: Once | TRANSDERMAL | Status: DC | PRN

## 2018-10-10 MED ORDER — DEXAMETHASONE SODIUM PHOSPHATE 10 MG/ML IJ SOLN
INTRAMUSCULAR | Status: AC
  Filled 2018-10-10: qty 2

## 2018-10-10 MED ORDER — LACTATED RINGERS IV SOLN
INTRAVENOUS | Status: DC
  Administered 2018-10-10: 07:00:00 via INTRAVENOUS

## 2018-10-10 MED FILL — HYDROCODON-APAP 5-325: 5-325 | 3 days supply | Qty: 30 | Fill #0

## 2018-10-10 SURGICAL SUPPLY — 42 items
BANDAGE ACE 3X5.8 VEL STRL LF (GAUZE/BANDAGES/DRESSINGS) ×2 IMPLANT
BLADE SURG 15 STRL LF DISP TIS (BLADE) ×2 IMPLANT
BLADE SURG 15 STRL SS (BLADE) ×4
BNDG CMPR 9X4 STRL LF SNTH (GAUZE/BANDAGES/DRESSINGS)
BNDG ESMARK 4X9 LF (GAUZE/BANDAGES/DRESSINGS) IMPLANT
CORD BIPOLAR FORCEPS 12FT (ELECTRODE) ×2 IMPLANT
COVER BACK TABLE 60X90IN (DRAPES) ×2 IMPLANT
COVER MAYO STAND STRL (DRAPES) ×2 IMPLANT
COVER WAND RF STERILE (DRAPES) IMPLANT
CUFF TOURNIQUET SINGLE 18IN (TOURNIQUET CUFF) ×1 IMPLANT
CUFF TOURNIQUET SINGLE 24IN (TOURNIQUET CUFF) IMPLANT
DRAPE EXTREMITY T 121X128X90 (DISPOSABLE) ×2 IMPLANT
DRAPE SURG 17X23 STRL (DRAPES) ×2 IMPLANT
DURAPREP 26ML APPLICATOR (WOUND CARE) ×2 IMPLANT
GAUZE SPONGE 4X4 12PLY STRL (GAUZE/BANDAGES/DRESSINGS) ×2 IMPLANT
GAUZE XEROFORM 1X8 LF (GAUZE/BANDAGES/DRESSINGS) ×2 IMPLANT
GLOVE BIO SURGEON STRL SZ7.5 (GLOVE) ×3 IMPLANT
GLOVE BIOGEL PI IND STRL 7.0 (GLOVE) IMPLANT
GLOVE BIOGEL PI IND STRL 8 (GLOVE) ×2 IMPLANT
GLOVE BIOGEL PI INDICATOR 7.0 (GLOVE) ×3
GLOVE BIOGEL PI INDICATOR 8 (GLOVE) ×1
GLOVE ECLIPSE 6.5 STRL STRAW (GLOVE) ×1 IMPLANT
GLOVE SURG SS PI 7.0 STRL IVOR (GLOVE) ×1 IMPLANT
GOWN STRL REUS W/ TWL LRG LVL3 (GOWN DISPOSABLE) ×1 IMPLANT
GOWN STRL REUS W/TWL LRG LVL3 (GOWN DISPOSABLE) ×4
GOWN STRL REUS W/TWL XL LVL3 (GOWN DISPOSABLE) ×3 IMPLANT
LOOP VESSEL MAXI BLUE (MISCELLANEOUS) IMPLANT
NDL HYPO 25X1 1.5 SAFETY (NEEDLE) IMPLANT
NEEDLE HYPO 25X1 1.5 SAFETY (NEEDLE) IMPLANT
NS IRRIG 1000ML POUR BTL (IV SOLUTION) ×2 IMPLANT
PACK BASIN DAY SURGERY FS (CUSTOM PROCEDURE TRAY) ×2 IMPLANT
PAD CAST 3X4 CTTN HI CHSV (CAST SUPPLIES) ×1 IMPLANT
PADDING CAST ABS 4INX4YD NS (CAST SUPPLIES) ×1
PADDING CAST ABS COTTON 4X4 ST (CAST SUPPLIES) ×1 IMPLANT
PADDING CAST COTTON 3X4 STRL (CAST SUPPLIES) ×2
SLEEVE SCD COMPRESS KNEE MED (MISCELLANEOUS) IMPLANT
STOCKINETTE 4X48 STRL (DRAPES) ×2 IMPLANT
SUT ETHILON 3 0 PS 1 (SUTURE) ×2 IMPLANT
SYR BULB 3OZ (MISCELLANEOUS) ×2 IMPLANT
SYR CONTROL 10ML LL (SYRINGE) IMPLANT
TOWEL GREEN STERILE FF (TOWEL DISPOSABLE) ×2 IMPLANT
UNDERPAD 30X30 (UNDERPADS AND DIAPERS) ×2 IMPLANT

## 2018-10-10 NOTE — Discharge Instructions (Signed)
Use your left hand as comfort allows. Ice and elevation as needed for swelling. Keep your dressing clean and dry. Leave your current dressing on for the next 5-6 days. Once you remove your dressings, place a large band-aid over your incision daily to protect the sutures.    Post Anesthesia Home Care Instructions  Activity: Get plenty of rest for the remainder of the day. A responsible individual must stay with you for 24 hours following the procedure.  For the next 24 hours, DO NOT: -Drive a car -Advertising copywriterperate machinery -Drink alcoholic beverages -Take any medication unless instructed by your physician -Make any legal decisions or sign important papers.  Meals: Start with liquid foods such as gelatin or soup. Progress to regular foods as tolerated. Avoid greasy, spicy, heavy foods. If nausea and/or vomiting occur, drink only clear liquids until the nausea and/or vomiting subsides. Call your physician if vomiting continues.  Special Instructions/Symptoms: Your throat may feel dry or sore from the anesthesia or the breathing tube placed in your throat during surgery. If this causes discomfort, gargle with warm salt water. The discomfort should disappear within 24 hours.  If you had a scopolamine patch placed behind your ear for the management of post- operative nausea and/or vomiting:  1. The medication in the patch is effective for 72 hours, after which it should be removed.  Wrap patch in a tissue and discard in the trash. Wash hands thoroughly with soap and water. 2. You may remove the patch earlier than 72 hours if you experience unpleasant side effects which may include dry mouth, dizziness or visual disturbances. 3. Avoid touching the patch. Wash your hands with soap and water after contact with the patch.

## 2018-10-10 NOTE — Op Note (Signed)
NAME: Chad Morton, Chad Morton MEDICAL RECORD OI:[id number] ACCOUNT [id number] DATE OF BIRTH:[birth year] FACILITY: [facility] LOCATION: [facility] PHYSICIAN:CHRISTOPHER Aretha Parrot, MD  OPERATIVE REPORT  DATE OF PROCEDURE:  10/10/2018  PREOPERATIVE DIAGNOSIS:  Left carpal tunnel syndrome.  POSTOPERATIVE DIAGNOSIS:  Left carpal tunnel syndrome.  PROCEDURE:  Left open carpal tunnel release.  SURGEON:  Vanita Panda. Magnus Ivan, MD  ANESTHESIA: 1.  Mask ventilation and IV sedation. 2.  1% plain lidocaine followed by 0.25% plain Marcaine.  ANTIBIOTICS:  Two grams IV Ancef.  TOURNIQUET TIME:  Less than 20 minutes.  COMPLICATIONS:  None.  INDICATIONS:  The patient is a 63 year old gentleman with severe carpal tunnel syndrome involving his left upper extremity.  This was confirmed with clinical exam and nerve conduction /EMG studies.  At this point, we have recommended an open carpal  tunnel release.  We had a long and thorough discussion about the surgery and the anatomy of the wrist.  We talked about the risks and benefits of surgery in detail.  DESCRIPTION OF PROCEDURE:  After informed consent was obtained.  Appropriate left wrist was marked.  He was brought to the operating room and placed supine on the operating table, left arm on the arm table.  A nonsterile tourniquet was placed on his  upper left arm and his left forearm, wrist and hand were prepped and draped with DuraPrep and sterile drapes.  A time-out was called and he was identified as correct patient, correct left hand.  I then used an Esmarch to wrap out the hand and tourniquet  was inflated to 250 mm of pressure.  We then anesthetized the palm with 1% plain lidocaine.  We then made an incision over the transverse carpal ligament and dissected down to the distal edge of this and slowly and meticulously divided the transverse  carpal ligament in its entirety, exposing the median nerve and finding it intact.  The transverse carpal ligament was  significantly thickened.  Once we divided that, we explored the median nerve and found its motor branch to be intact.   We irrigated the  soft tissue with normal saline solution.  We then reapproximated the skin with interrupted 3-0 nylon suture.  I then infiltrated the incision again with 0.25% plain Marcaine.  Xeroform well-padded sterile dressing was applied.  The tourniquet was let  down.  His fingers pinked in nicely.    He was taken to recovery room in stable condition.  All final counts were correct.  There were no complications noted.  AN/NUANCE  D:10/10/2018 T:10/10/2018 JOB:[id number]

## 2018-10-10 NOTE — Brief Op Note (Signed)
10/10/2018  7:49 AM  PATIENT:  Chad Morton  63 y.o. male  PRE-OPERATIVE DIAGNOSIS:  Left carpal tunnel syndrome  POST-OPERATIVE DIAGNOSIS:  Left carpal tunnel syndrome  PROCEDURE:  Procedure(s): LEFT CARPAL TUNNEL RELEASE (Left)  SURGEON:  Surgeon(s) and Role:    Kathryne Hitch, MD - Primary  ANESTHESIA:   local and IV sedation  EBL:  minimal  COUNTS:  YES  TOURNIQUET:   Total Tourniquet Time Documented: Forearm (Left) - 10 minutes Total: Forearm (Left) - 10 minutes   DICTATION: .Other Dictation: Dictation Number [id number]  PLAN OF CARE: Discharge to home after PACU  PATIENT DISPOSITION:  PACU - hemodynamically stable.   Delay start of Pharmacological VTE agent (>24hrs) due to surgical blood loss or risk of bleeding: no

## 2018-10-10 NOTE — Anesthesia Postprocedure Evaluation (Signed)
Anesthesia Post Note  Patient: Coben R Cordone  Procedure(s) Performed: LEFT CARPAL TUNNEL RELEASE (Left Wrist)     Patient location during evaluation: PACU Anesthesia Type: MAC Level of consciousness: awake and alert Pain management: pain level controlled Vital Signs Assessment: post-procedure vital signs reviewed and stable Respiratory status: spontaneous breathing, nonlabored ventilation and respiratory function stable Cardiovascular status: stable and blood pressure returned to baseline Postop Assessment: no apparent nausea or vomiting Anesthetic complications: no    Last Vitals:  Vitals:   10/10/18 0830 10/10/18 0840  BP: 117/80 107/78  Pulse: 71 (!) 59  Resp: 18 18  Temp:  36.4 C  SpO2: 99% 98%    Last Pain:  Vitals:   10/10/18 0840  TempSrc:   PainSc: 0-No pain                 Lynda Rainwater

## 2018-10-10 NOTE — H&P (Signed)
Chad Morton is an 63 y.o. male.   Chief Complaint:   Left hand numbness; known carpal tunnel syndrome HPI:   63 yo male with well-documented left carpal tunnel syndrome confirmed by clinical exam and EMG/NCVS.  A left carpal tunnel release surgery has been recommended given the extent of his carpal tunnel syndrome.  Past Medical History:  Diagnosis Date  . 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 Diabetes mellitus without complication ([place])   . Glaucoma    [facility]  . Hyperlipidemia   . Hypertension   . Post-operative nausea and vomiting     Past Surgical History:  Procedure Laterality Date  . COLONOSCOPY  2010  . KNEE ARTHROSCOPY W/ ACL RECONSTRUCTION Bilateral    both knees x1  . MANDIBLE FRACTURE SURGERY Bilateral   . TONSILECTOMY/ADENOIDECTOMY WITH MYRINGOTOMY     childhood    Family History  Problem Relation Age of Onset  . Myasthenia gravis Mother        complications from this  . Colon cancer Father        diagnosed 30- died within a year of diagnosis  . Diabetes Brother   . Hyperlipidemia Brother    Social History:  reports that he has never smoked. He has never used smokeless tobacco. He reports current alcohol use of about 10.0 - 12.0 standard drinks of alcohol per week. He reports that he does not use drugs.  Allergies:  Allergies  Allergen Reactions  . Diphtheria Toxoid Swelling    Medications Prior to Admission  Medication Sig Dispense Refill  . aspirin EC 81 MG tablet Take 1 tablet by mouth daily.    . benazepril (LOTENSIN) 40 MG tablet Take 1 tablet (40 mg total) by mouth daily. 90 tablet 2  . blood glucose meter kit and supplies Dispense based on patient and insurance preference. Use daily as directed to test blood sugar (E11.9). 1 each 0  . COMBIGAN 0.2-0.5 % ophthalmic solution Place 1 drop into both eyes 2 (two) times daily.  3  . glimepiride (AMARYL) 4 MG tablet Take 1.5 tablets (6 mg total) by mouth daily. 135  tablet 2  . metFORMIN (GLUCOPHAGE-XR) 500 MG 24 hr tablet Take 2 tablets (1,000 mg total) by mouth 2 (two) times daily. 360 tablet 2  . pravastatin (PRAVACHOL) 40 MG tablet Take 1 tablet (40 mg total) by mouth daily. 90 tablet 2    Results for orders placed or performed during the hospital encounter of 10/10/18 (from the past 48 hour(s))  Glucose, capillary     Status: Abnormal   Collection Time: 10/10/18  6:42 AM  Result Value Ref Range   Glucose-Capillary 187 (H) 70 - 99 mg/dL   No results found.  Review of Systems  All other systems reviewed and are negative.   Blood pressure 125/84, pulse 79, temperature 98.3 F (36.8 C), temperature source Oral, resp. rate 18, height _0  (1.727 m), weight 78.3 kg, SpO2 100 %. Physical Exam  Constitutional: He is oriented to person, place, and time. He appears well-developed and well-nourished.  HENT:  Head: Normocephalic and atraumatic.  Eyes: Pupils are equal, round, and reactive to light. EOM are normal.  Neck: Normal range of motion. Neck supple.  Cardiovascular: Normal rate.  Respiratory: Effort normal.  GI: Soft.  Musculoskeletal:     Left hand: Decreased sensation noted. Decreased sensation is present in the medial distribution. Decreased strength noted.  Neurological: He is alert and oriented  to person, place, and time.  Skin: Skin is warm and dry.  Psychiatric: He has a normal mood and affect.     Assessment/Plan Left carpal tunnel syndrome  To the OR today as an outpatient for a left open carpal tunnel release.  The risks and benefits of surgery have been discussed and informed consent is obtained.  Mcarthur Rossetti, MD 10/10/2018, 7:13 AM

## 2018-10-10 NOTE — Anesthesia Procedure Notes (Signed)
Procedure Name: MAC Date/Time: 10/10/2018 7:29 AM Performed by: Signe Colt, CRNA Pre-anesthesia Checklist: Patient identified, Emergency Drugs available, Suction available, Patient being monitored and Timeout performed Patient Re-evaluated:Patient Re-evaluated prior to induction Oxygen Delivery Method: Simple face mask

## 2018-10-10 NOTE — Transfer of Care (Signed)
Immediate Anesthesia Transfer of Care Note  Patient: Chad Morton  Procedure(s) Performed: LEFT CARPAL TUNNEL RELEASE (Left Wrist)  Patient Location: PACU  Anesthesia Type:MAC  Level of Consciousness: awake, alert , oriented and patient cooperative  Airway & Oxygen Therapy: Patient Spontanous Breathing and Patient connected to face mask oxygen  Post-op Assessment: Report given to RN and Post -op Vital signs reviewed and stable  Post vital signs: Reviewed and stable  Last Vitals:  Vitals Value Taken Time  BP    Temp    Pulse    Resp    SpO2      Last Pain:  Vitals:   10/10/18 0630  TempSrc: Oral  PainSc:          Complications: No apparent anesthesia complications

## 2018-10-11 ENCOUNTER — Encounter (HOSPITAL_BASED_OUTPATIENT_CLINIC_OR_DEPARTMENT_OTHER): Payer: Self-pay | Admitting: Orthopaedic Surgery

## 2018-10-20 ENCOUNTER — Encounter: Payer: Self-pay | Admitting: Sports Medicine

## 2018-10-24 ENCOUNTER — Encounter (INDEPENDENT_AMBULATORY_CARE_PROVIDER_SITE_OTHER): Payer: Self-pay | Admitting: Orthopaedic Surgery

## 2018-10-24 ENCOUNTER — Ambulatory Visit (INDEPENDENT_AMBULATORY_CARE_PROVIDER_SITE_OTHER): Payer: No Typology Code available for payment source | Admitting: Orthopaedic Surgery

## 2018-10-24 DIAGNOSIS — Z9889 Other specified postprocedural states: Secondary | ICD-10-CM

## 2018-10-24 NOTE — Progress Notes (Signed)
The patient is here 2 weeks status post a left carpal tunnel release.  He is doing well overall.  He reports significant pain relief.  He says the prickly feeling is gone but he still has numbness in mainly his index finger middle finger but overall he is doing very well.  The sutures have been removed and Steri-Strips applied.  He is got good pinch and grip strength as well.  Everything looks normal otherwise.  At this point I will continue increase his activities as comfort allows.  I would like to see him back just for 1 more visit in 4 weeks just to make sure things are doing well.  All question concerns were answered and addressed.

## 2018-11-14 MED FILL — GLIMEPIRIDE 4 MG TABLET: 4 | 90 days supply | Qty: 135 | Fill #2

## 2018-11-14 MED FILL — COMBIGAN EYE DROPS: 0.2-0.5 | 25 days supply | Qty: 5 | Fill #2

## 2018-11-14 MED FILL — PRAVASTATIN NA 40 MG TAB: 40 | 90 days supply | Qty: 90 | Fill #2

## 2018-11-21 ENCOUNTER — Encounter (INDEPENDENT_AMBULATORY_CARE_PROVIDER_SITE_OTHER): Payer: Self-pay | Admitting: Orthopaedic Surgery

## 2018-11-21 ENCOUNTER — Ambulatory Visit (INDEPENDENT_AMBULATORY_CARE_PROVIDER_SITE_OTHER): Payer: No Typology Code available for payment source | Admitting: Orthopaedic Surgery

## 2018-11-21 DIAGNOSIS — Z9889 Other specified postprocedural states: Secondary | ICD-10-CM

## 2018-11-21 NOTE — Progress Notes (Signed)
The patient is 6 weeks status post a left carpal tunnel release.  He is doing well overall.  He reports minimal discomfort.  He has some residual numbness still in his middle finger but the other numbness is gone away in his thumb and index finger.  On exam his incision looks great.  He has just minimal pain in the palm of his hand.  He has excellent grip and pinch strength with full mobility of his fingers and thumb.  At this point follow-up can be as needed.  All questions concerns were answered and addressed.  He knows it will take a while to get full nerve recovery but hopefully he should since he is not a diabetic and not a smoker.

## 2018-12-04 ENCOUNTER — Other Ambulatory Visit: Payer: Self-pay | Admitting: Family Medicine

## 2018-12-04 MED FILL — COMBIGAN EYE DROPS: 0.2-0.5 | 25 days supply | Qty: 5 | Fill #3

## 2018-12-04 MED FILL — ACCU-CHEK FASTCLIX LANCETS: 90 days supply | Qty: 102 | Fill #0

## 2018-12-05 ENCOUNTER — Other Ambulatory Visit: Payer: Self-pay

## 2018-12-05 MED ORDER — GLUCOSE BLOOD VI STRP: ORAL_STRIP | 12 refills | Status: DC

## 2018-12-05 MED FILL — ACCU-CHEK GUIDE TEST STRIP: 90 days supply | Qty: 100 | Fill #0

## 2018-12-06 ENCOUNTER — Other Ambulatory Visit: Payer: Self-pay | Admitting: Family Medicine

## 2018-12-20 ENCOUNTER — Encounter: Payer: Self-pay | Admitting: Sports Medicine

## 2018-12-26 ENCOUNTER — Telehealth: Payer: Self-pay

## 2018-12-26 ENCOUNTER — Other Ambulatory Visit: Payer: Self-pay | Admitting: Family Medicine

## 2018-12-26 DIAGNOSIS — E1169 Type 2 diabetes mellitus with other specified complication: Secondary | ICD-10-CM

## 2018-12-26 DIAGNOSIS — M25569 Pain in unspecified knee: Secondary | ICD-10-CM

## 2018-12-26 DIAGNOSIS — E119 Type 2 diabetes mellitus without complications: Secondary | ICD-10-CM

## 2018-12-26 DIAGNOSIS — E785 Hyperlipidemia, unspecified: Secondary | ICD-10-CM

## 2018-12-26 MED FILL — BENAZEPRIL HCL 40 MG TABLET: 40 | 90 days supply | Qty: 90 | Fill #2

## 2018-12-26 MED FILL — metFORMIN HCL ER 500 MG TB2: 500 | 90 days supply | Qty: 360 | Fill #2

## 2018-12-26 NOTE — Telephone Encounter (Signed)
Called [facility] to see if prior Berkley Harvey is needed. Spoke with Philipp Deputy who advised that PA is not required for Zilretta or injections costing less than $1,500. Pt will need to have new referral placed by PCP to Dr. Berline Chough for knee pain.

## 2018-12-27 NOTE — Telephone Encounter (Signed)
Yes thanks- can place referral to Dr. Berline Chough

## 2018-12-27 NOTE — Telephone Encounter (Signed)
FYI  Spoke to pt and he was advised that referral has been placed. Pt verbalized understanding and stated that theres no need to put a rush on this issue due to Covid-19. Pt stated he is tolerating the pain himself right now.

## 2018-12-27 NOTE — Addendum Note (Signed)
Addended by: Waymon Amato R on: 12/27/2018 12:22 PM   Modules accepted: Orders

## 2018-12-27 NOTE — Telephone Encounter (Signed)
Okay for referral?

## 2018-12-30 MED FILL — COMBIGAN EYE DROPS: 0.2-0.5 | 25 days supply | Qty: 5 | Fill #4

## 2018-12-31 NOTE — Telephone Encounter (Signed)
Pt contacted regarding Zilretta and informed that the injection did not require a PA.  Reminded the pt to contact [facility] once he schedules his appt for the Zilretta injection.  Pt verbalizes understanding and reports that his knee is doing OK at this point.  He states that he will delay coming in to the office until things die down a bit with Covid-19

## 2018-12-31 NOTE — Telephone Encounter (Signed)
Please contact pt and remind him to contact [facility] once he has scheduled his appointment with Dr. Berline Chough.

## 2019-01-07 ENCOUNTER — Encounter: Payer: Self-pay | Admitting: Family Medicine

## 2019-01-07 NOTE — Patient Instructions (Addendum)
Health Maintenance Due  Topic Date Due  . FOOT EXAM  10/09/2018   Video visit- update foot exam noext visit

## 2019-01-07 NOTE — Progress Notes (Signed)
Phone [phone]   Subjective:  Virtual visit via Video note. Chief complaint: Chief Complaint  Patient presents with  . Follow-up    4 MONTH MEDICATION F/U   This visit type was conducted due to national recommendations for restrictions regarding the COVID-19 Pandemic (e.g. social distancing).  This format is felt to be most appropriate for this patient at this time balancing risks to patient and risks to population by having him in for in person visit.  No physical exam was performed (except for noted visual exam or audio findings with Telehealth visits).    Our team/I connected with Chad Morton on 01/08/19 at  8:00 AM EDT by a video enabled telemedicine application ([email or web address]) and verified that I am speaking with the correct person using two identifiers.  Location patient: [facility] Location provider: [facility], office Persons participating in the virtual visit:  patient  Our team/I discussed the limitations of evaluation and management by telemedicine and the availability of in person appointments. In light of current covid-19 pandemic, patient also understands that we are trying to protect them by minimizing in office contact if at all possible.  The patient expressed consent for telemedicine visit and agreed to proceed. Patient understands insurance will be billed.   ROS- no reported chest pain, significant hypoglycemia, shortness of breath. No reported edema or trouble with speech.    Past Medical History-  Patient Active Problem List   Diagnosis Date Noted  . 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 Type 2 diabetes mellitus without complication, without long-term current use of insulin ([place]) 05/30/2017    Priority: High  . Hypertension, essential 10/09/2017    Priority: Medium  . Hyperlipidemia associated with type 2 diabetes mellitus ([place]) 10/09/2017    Priority: Medium  . Glaucoma     Priority: Medium  . Degenerative arthritis of right knee  01/16/2018    Priority: Low  . Family history of colon cancer 10/09/2017    Priority: Low  . Status post carpal tunnel release 10/24/2018  . Carpal tunnel syndrome, left upper limb 09/16/2018  . Carpal tunnel syndrome 07/09/2018    Medications- reviewed and updated Current Outpatient Medications  Medication Sig Dispense Refill  . aspirin EC 81 MG tablet Take 1 tablet by mouth daily.    . benazepril (LOTENSIN) 40 MG tablet Take 1 tablet (40 mg total) by mouth daily. 90 tablet 2  . blood glucose meter kit and supplies Dispense based on patient and insurance preference. Use daily as directed to test blood sugar (E11.9). 1 each 0  . COMBIGAN 0.2-0.5 % ophthalmic solution Place 1 drop into both eyes 2 (two) times daily.  3  . glimepiride (AMARYL) 4 MG tablet TAKE 1 AND 1/2 TABLETS BY MOUTH DAILY 135 tablet 1  . glucose blood test strip USE TO CHECK BLOOD GLUCOSE DAILY. 100 each 0  . metFORMIN (GLUCOPHAGE-XR) 500 MG 24 hr tablet Take 2 tablets (1,000 mg total) by mouth 2 (two) times daily. 360 tablet 2  . pravastatin (PRAVACHOL) 40 MG tablet TAKE 1 TABLET BY MOUTH ONCE DAILY 90 tablet 1  . Accu-Chek FastClix Lancets MISC USE TO TEST BLOOD SUGAR ONCE DAILY AS DIRECTED 102 each 4     Objective:  BP 123/81 (BP Location: Left Arm, Patient Position: Sitting, Cuff Size: Normal)   Ht _0  (1.727 m)   Wt 173 lb (78.5 kg)   BMI 26.30 kg/m  Gen: NAD, resting comfortably Lungs: nonlabored, normal respiratory rate  Skin: appears dry, no obvious rash Normal speech    Assessment and Plan   # Diabetes/overweight S:  controlled on metformin 1g BID XR and amaryl 6 mg last visit- we reduced amaryl to 4 mg due to some hypoglycemia CBGs- has only had 1 sugar down 69 in last few months- so doing much better.  Exercise and diet- weight stable. 11-15k steps a day with work. cbgs range from 120-175 but takes his during the day. Overweight by BMI but reasonable muscle mass and weight pretty reasonable Lab  Results  Component Value Date   HGBA1C 6.4 08/14/2018   HGBA1C 7.1 (H) 03/12/2018   HGBA1C 7.1 07/30/2017   A/P: patient will come by for drive up T2I- [place] will coordinate with patient. 4-6 month follow up depending on a1c - I do not think he needs substantial weight loss but healthy eating and regular exercise should be continued.   #hypertension S: controlled on benazepril 68m alone when checked yesterday.  BP Readings from Last 3 Encounters:  01/07/19 123/81  10/10/18 107/78  09/10/18 124/78  A/P: Stable. Continue current medications.   #hyperlipidemia S: reasonably controlled on pravastatin 40 mg Lab Results  Component Value Date   CHOL 160 08/14/2018   HDL 67.70 08/14/2018   LDLCALC 73 08/14/2018   LDLDIRECT 71.0 03/12/2018   TRIG 94.0 08/14/2018   CHOLHDL 2 08/14/2018   A/P: Stable. Continue current medications.   Other notes: 1.earlier in 2020-:patient saw Dr. BNinfa Lindenfor surgery on left wrist- carpal tunnel release. Will check in with ptaient next visit to see how wrist is doing. Prior failed injections   4-6 months depending on a1c  Lab/Order associations: Type 2 diabetes mellitus without complication, without long-term current use of insulin (HCC)  Hypertension, essential  Hyperlipidemia associated with type 2 diabetes mellitus ([place]  Overweight  Return precautions advised.  SGarret Reddish MD

## 2019-01-08 ENCOUNTER — Ambulatory Visit (INDEPENDENT_AMBULATORY_CARE_PROVIDER_SITE_OTHER): Payer: No Typology Code available for payment source | Admitting: Family Medicine

## 2019-01-08 VITALS — BP 123/81 | Ht 68.0 in | Wt 173.0 lb

## 2019-01-08 DIAGNOSIS — E119 Type 2 diabetes mellitus without complications: Secondary | ICD-10-CM | POA: Diagnosis not present

## 2019-01-08 DIAGNOSIS — E663 Overweight: Secondary | ICD-10-CM

## 2019-01-08 DIAGNOSIS — I10 Essential (primary) hypertension: Secondary | ICD-10-CM

## 2019-01-08 DIAGNOSIS — E785 Hyperlipidemia, unspecified: Secondary | ICD-10-CM

## 2019-01-08 DIAGNOSIS — E1169 Type 2 diabetes mellitus with other specified complication: Secondary | ICD-10-CM

## 2019-01-08 LAB — POCT GLYCOSYLATED HEMOGLOBIN (HGB A1C): Hemoglobin A1C: 6.6 % — AB (ref 4.0–5.6)

## 2019-01-08 NOTE — Addendum Note (Signed)
Addended by: Wyvonne Lenz on: 01/08/2019 08:56 AM   Modules accepted: Orders

## 2019-01-22 MED FILL — COMBIGAN EYE DROPS: 0.2-0.5 | 25 days supply | Qty: 5 | Fill #5

## 2019-01-25 MED FILL — PRAVASTATIN NA 40 MG TAB: 40 | 90 days supply | Qty: 90 | Fill #0

## 2019-01-25 MED FILL — GLIMEPIRIDE 4 MG TABLET: 4 | 90 days supply | Qty: 135 | Fill #0

## 2019-02-18 MED FILL — COMBIGAN EYE DROPS: 0.2-0.5 | 25 days supply | Qty: 5 | Fill #6

## 2019-03-13 MED FILL — COMBIGAN EYE DROPS: 0.2-0.5 | 25 days supply | Qty: 5 | Fill #7

## 2019-03-24 ENCOUNTER — Other Ambulatory Visit: Payer: Self-pay | Admitting: Family Medicine

## 2019-03-24 DIAGNOSIS — E119 Type 2 diabetes mellitus without complications: Secondary | ICD-10-CM

## 2019-03-24 MED FILL — METFORMIN HCL ER 500 MG TB2: 500 | 30 days supply | Qty: 120 | Fill #0

## 2019-03-28 ENCOUNTER — Other Ambulatory Visit: Payer: Self-pay | Admitting: Family Medicine

## 2019-03-28 DIAGNOSIS — I10 Essential (primary) hypertension: Secondary | ICD-10-CM

## 2019-03-31 MED FILL — BENAZEPRIL HCL 40 MG TABLET: 40 | 90 days supply | Qty: 90 | Fill #0

## 2019-04-04 MED FILL — COMBIGAN EYE DROPS: 0.2-0.5 | 25 days supply | Qty: 5 | Fill #8

## 2019-04-17 MED FILL — METFORMIN HCL ER 500 MG TB2: 500 | 30 days supply | Qty: 120 | Fill #1

## 2019-04-19 MED FILL — GLIMEPIRIDE 4 MG TABS: 4 | 90 days supply | Qty: 135 | Fill #1

## 2019-04-19 MED FILL — PRAVASTATIN NA 40 MG TAB: 40 | 90 days supply | Qty: 90 | Fill #1

## 2019-04-29 MED FILL — COMBIGAN EYE DROPS: 0.2-0.5 | 25 days supply | Qty: 5 | Fill #9

## 2019-05-17 MED FILL — metFORMIN HCL ER 500 MG TB2: 500 | 30 days supply | Qty: 120 | Fill #2

## 2019-05-24 MED FILL — COMBIGAN EYE DROPS: 0.2-0.5 | 25 days supply | Qty: 5 | Fill #10

## 2019-05-27 IMAGING — MR MR KNEE*R* W/O CM
4 of 6 series · 19 of 40 positions shown · non-contrast
Comparison: None.

CLINICAL DATA: Right medial knee pain for 3 months. Skiing injury
several years ago. ACL repair 20 years ago.

EXAM:
MRI OF THE RIGHT KNEE WITHOUT CONTRAST
TECHNIQUE: Multiplanar, multisequence MR imaging of the knee was performed. No
intravenous contrast was administered.

[Series 3: PD fat-sat · axial · 4.0mm · 0.31mm/px · z∈[-79,+36]mm · 9 of 27 slices shown (1 of 4)]
[im 1/27]
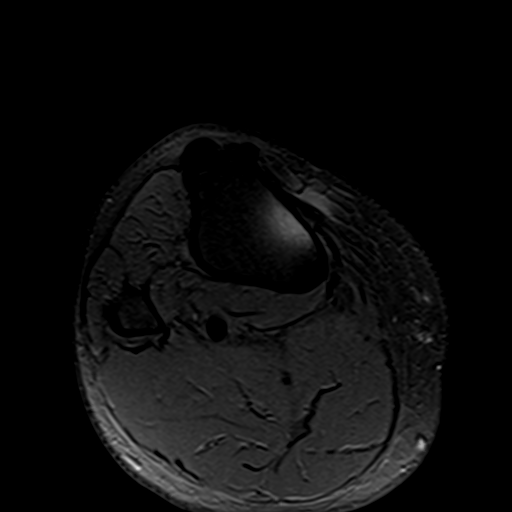
[im 4/27]
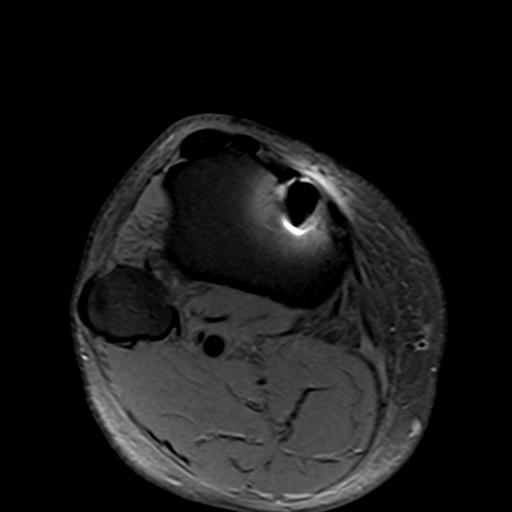
[im 7/27]
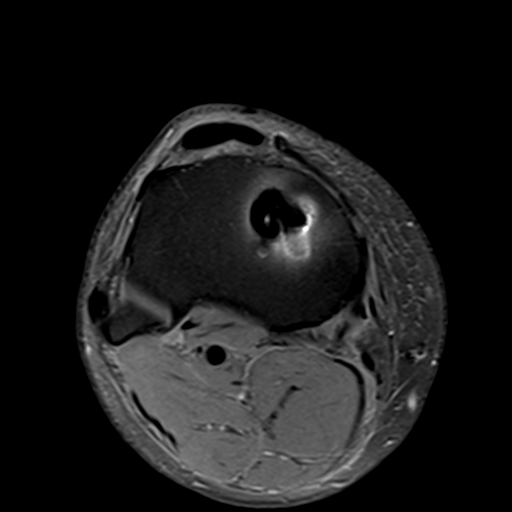
[im 10/27]
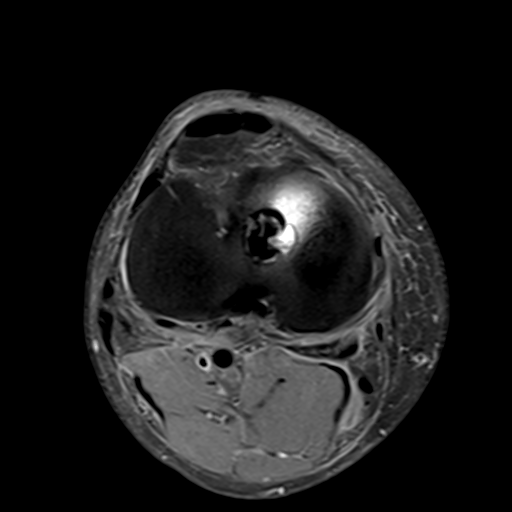
[im 14/27]
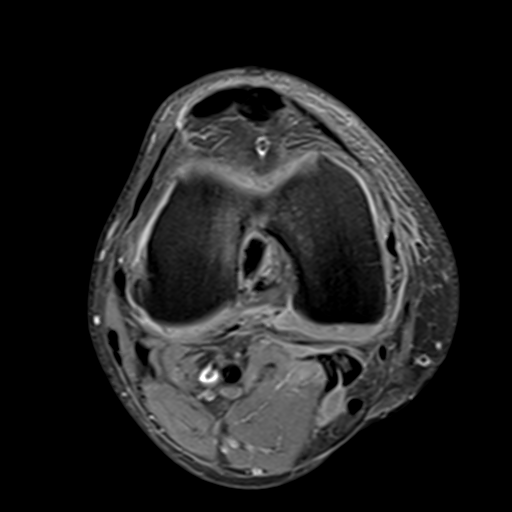
[im 17/27]
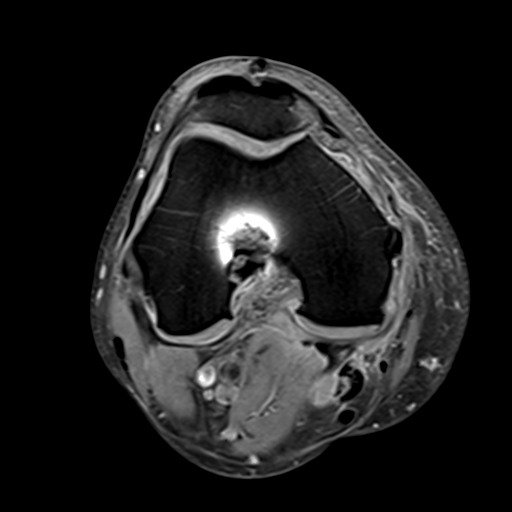
[im 20/27]
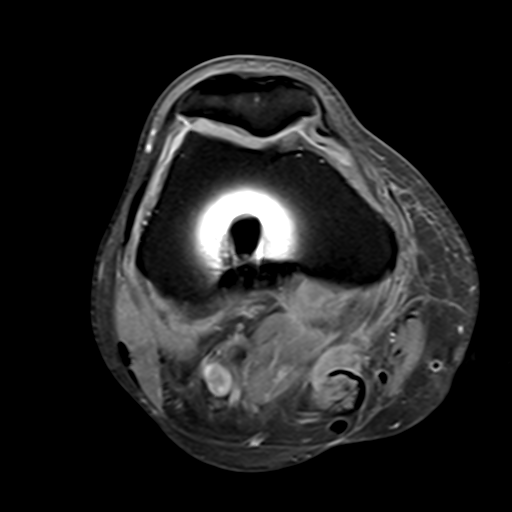
[im 23/27]
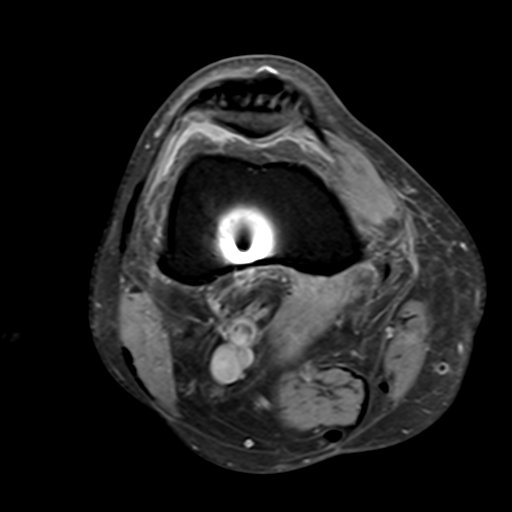
[im 27/27]
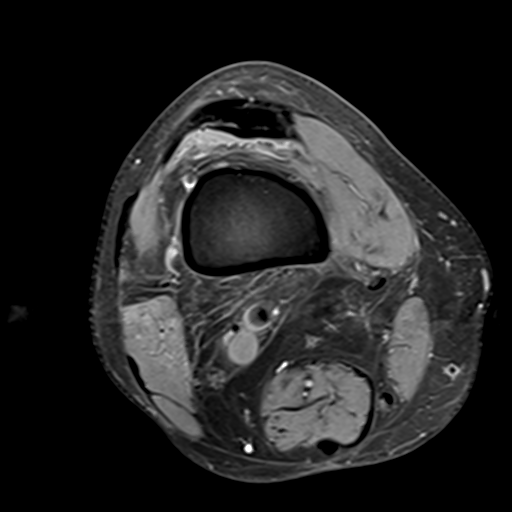

[Series 5: PD fat-sat · sagittal · 4.0mm · 0.31mm/px · 4 of 22 slices shown (2 of 4)]
[im 1/22]
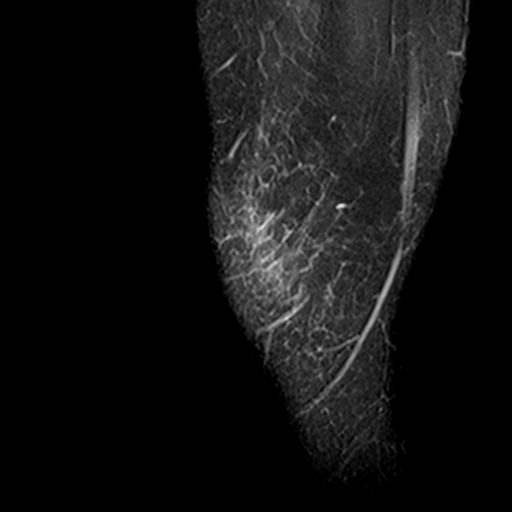
[im 4/22]
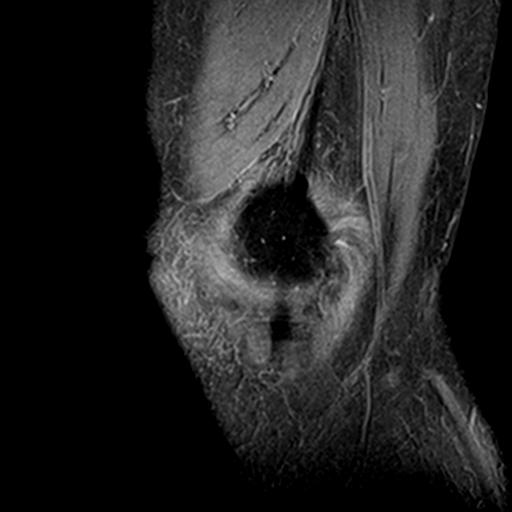
[im 11/22]
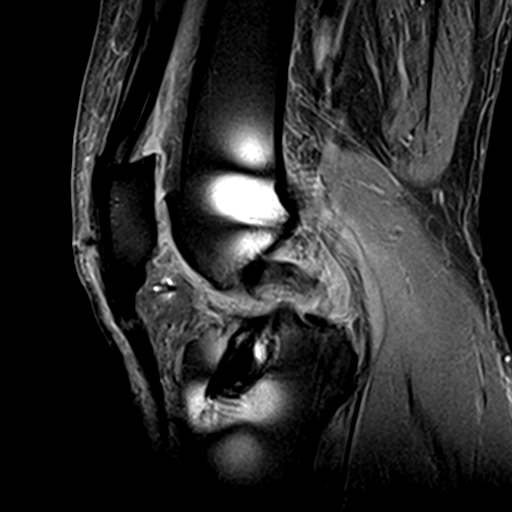
[im 18/22]
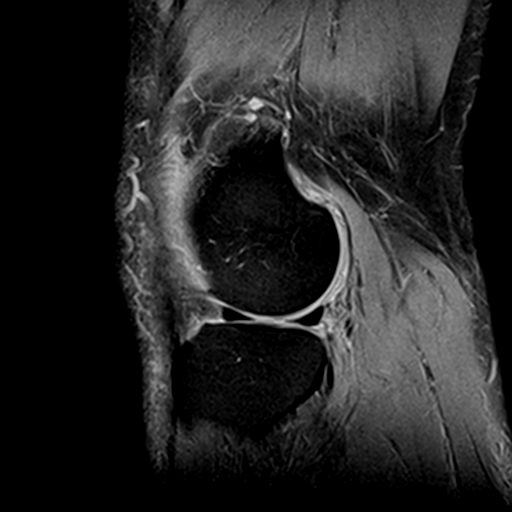

[Series 7: PD fat-sat · coronal · 4.0mm · 0.31mm/px · 3 of 23 slices shown (3 of 4)]
[im 4/23]
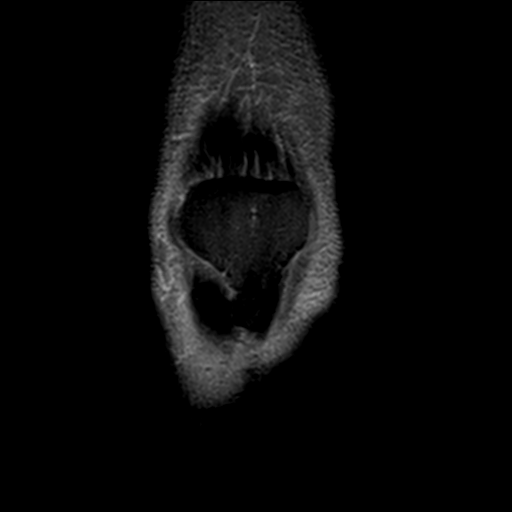
[im 12/23]
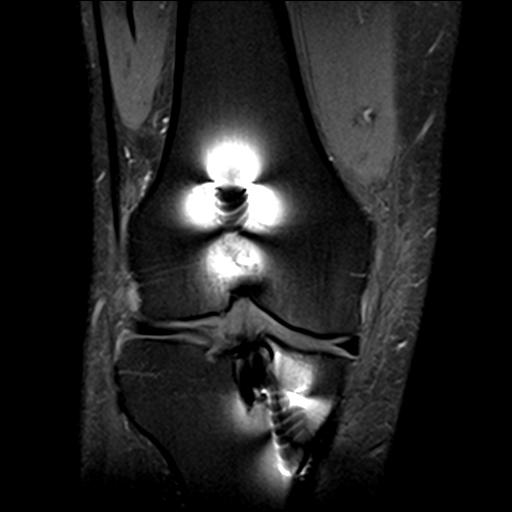
[im 19/23]
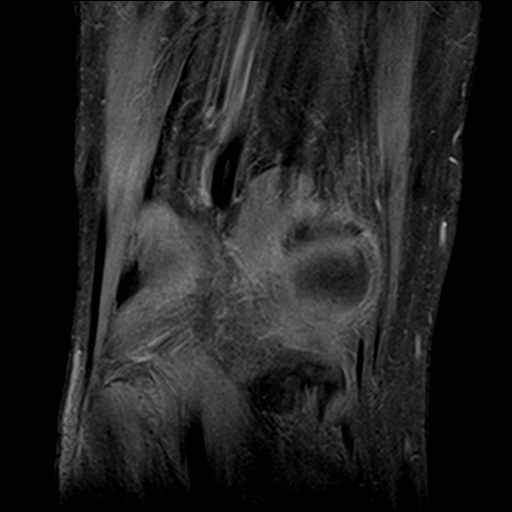

[Series 8: PD fat-sat · coronal · 2.0mm · 0.29mm/px · 3 of 11 slices shown (4 of 4)]
[im 1/11]
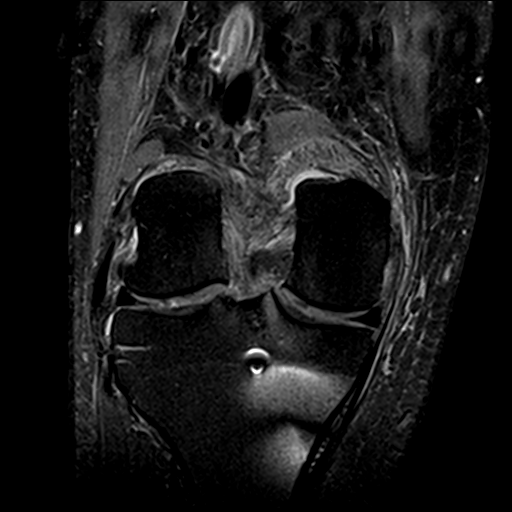
[im 6/11]
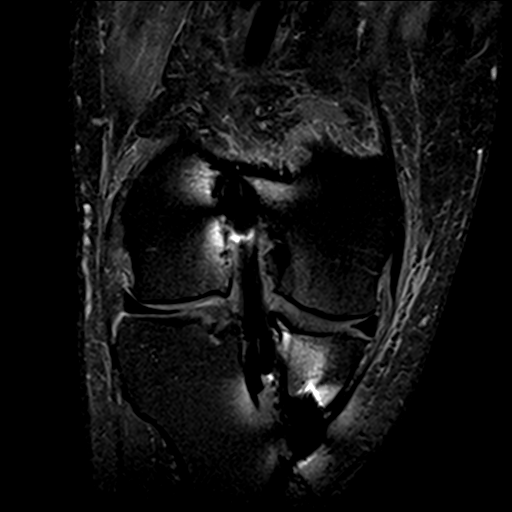
[im 11/11]
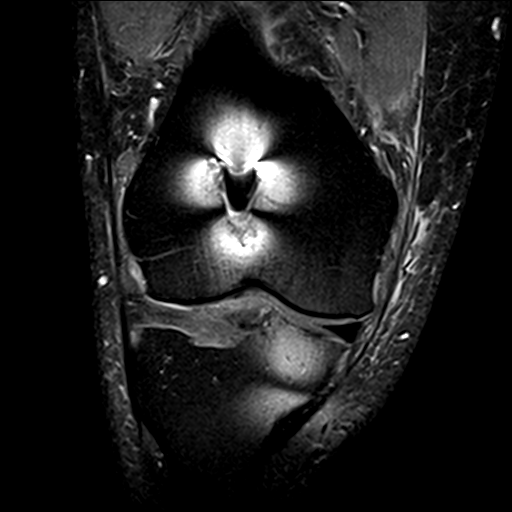

[19 of 40 positions shown; findings below may reference images not displayed]

FINDINGS: MENISCI

Medial meniscus:  Intact.

Lateral meniscus:  Intact.

LIGAMENTS

Cruciates: Prior ACL repair with the ACL graft intact. ACL graft is
slightly more vertically oriented relative to Blumensaat's line.
Increased signal in the proximal PCL most consistent with prior
injury versus degeneration. PCL is otherwise intact.

Collaterals: Medial collateral ligament is intact. Lateral
collateral ligament complex is intact.

CARTILAGE

Patellofemoral: Partial-thickness cartilage loss of the
patellofemoral compartment.

Medial: Partial-thickness cartilage loss of the medial femorotibial
compartment with tiny marginal osteophytes.

Lateral: Partial-thickness cartilage loss of the lateral
femorotibial compartment with tiny marginal osteophytes.

Joint: Small joint effusion. Normal Hoffa's fat. No plical
thickening.

Popliteal Fossa:  Small Baker's cyst.  Intact popliteus tendon.

Extensor Mechanism: Intact quadriceps tendon. Postsurgical changes
from prior patellar tendon harvesting. Intact patellar tendon.
intact medial patellar retinaculum. Intact lateral patellar
retinaculum. Intact MPFL.

Bones:  No acute osseous abnormality.  No aggressive osseous lesion.

Other: No fluid collection or hematoma.
IMPRESSION: 1. Tricompartmental cartilage abnormalities as described above.
2. Prior ACL repair with the ACL graft intact.

## 2019-06-16 MED FILL — metFORMIN HCL ER 500 MG TB2: 500 | 30 days supply | Qty: 120 | Fill #3

## 2019-06-18 MED FILL — COMBIGAN EYE DROPS: 0.2-0.5 | 25 days supply | Qty: 5 | Fill #11

## 2019-06-25 MED FILL — BENAZEPRIL HCL 40 MG TABLET: 40 | 90 days supply | Qty: 90 | Fill #1

## 2019-07-14 MED FILL — COMBIGAN EYE DROPS: 0.2-0.5 | 25 days supply | Qty: 5 | Fill #12

## 2019-07-16 MED FILL — metFORMIN HCL ER 500 MG TB2: 500 | 90 days supply | Qty: 360 | Fill #4

## 2019-07-18 ENCOUNTER — Other Ambulatory Visit: Payer: Self-pay | Admitting: Family Medicine

## 2019-07-18 DIAGNOSIS — E119 Type 2 diabetes mellitus without complications: Secondary | ICD-10-CM

## 2019-07-18 DIAGNOSIS — E1169 Type 2 diabetes mellitus with other specified complication: Secondary | ICD-10-CM

## 2019-07-18 DIAGNOSIS — E785 Hyperlipidemia, unspecified: Secondary | ICD-10-CM

## 2019-07-19 MED FILL — COMBIGAN EYE DROPS: 0.2-0.5 | 25 days supply | Qty: 5 | Fill #12

## 2019-07-21 MED FILL — GLIMEPIRIDE 4 MG TABLET: 4 | 90 days supply | Qty: 135 | Fill #0

## 2019-07-21 MED FILL — PRAVASTATIN NA 40 MG TAB: 40 | 90 days supply | Qty: 90 | Fill #0

## 2019-08-11 MED FILL — COMBIGAN EYE DROPS: 0.2-0.5 | 25 days supply | Qty: 5 | Fill #13

## 2019-08-13 IMAGING — DX DG KNEE AP/LAT W/ SUNRISE*L*
3 series · 3 of 3 positions shown · non-contrast
Comparison: None.

CLINICAL DATA: Knee pain.  Remote ACL repair

EXAM:
LEFT KNEE 3 VIEWS

[knee standing ap]
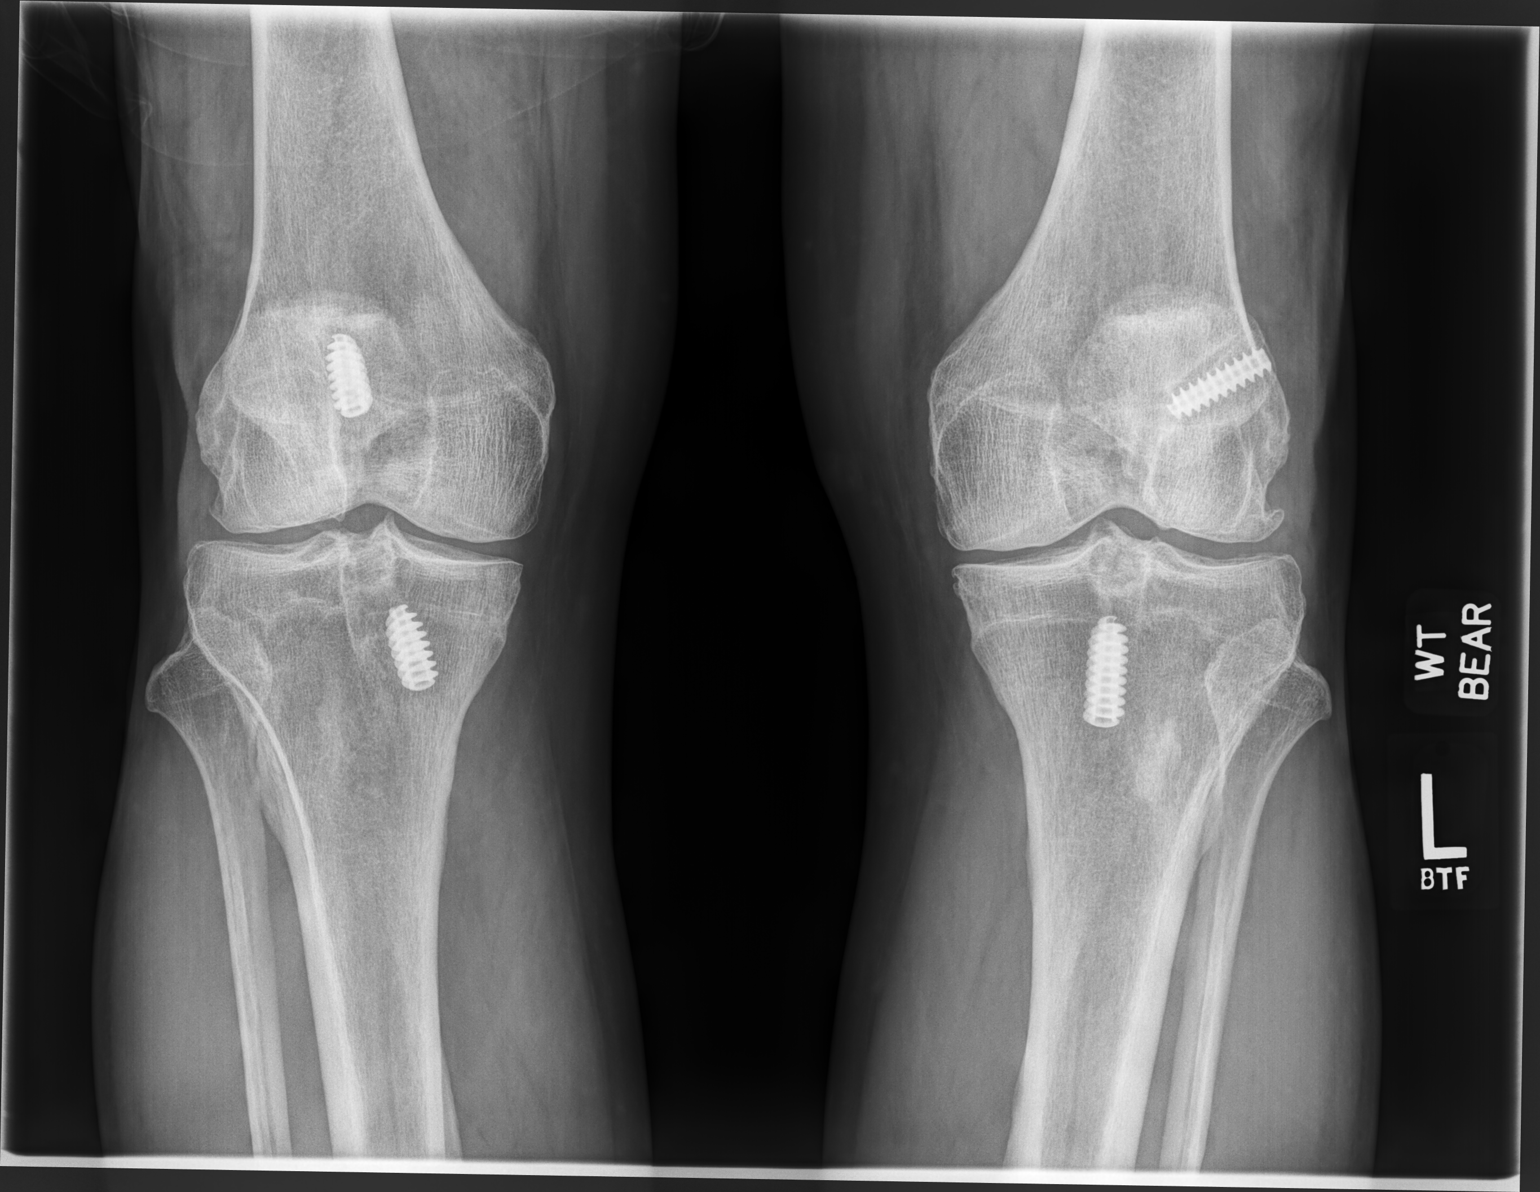

[knee standing lat]
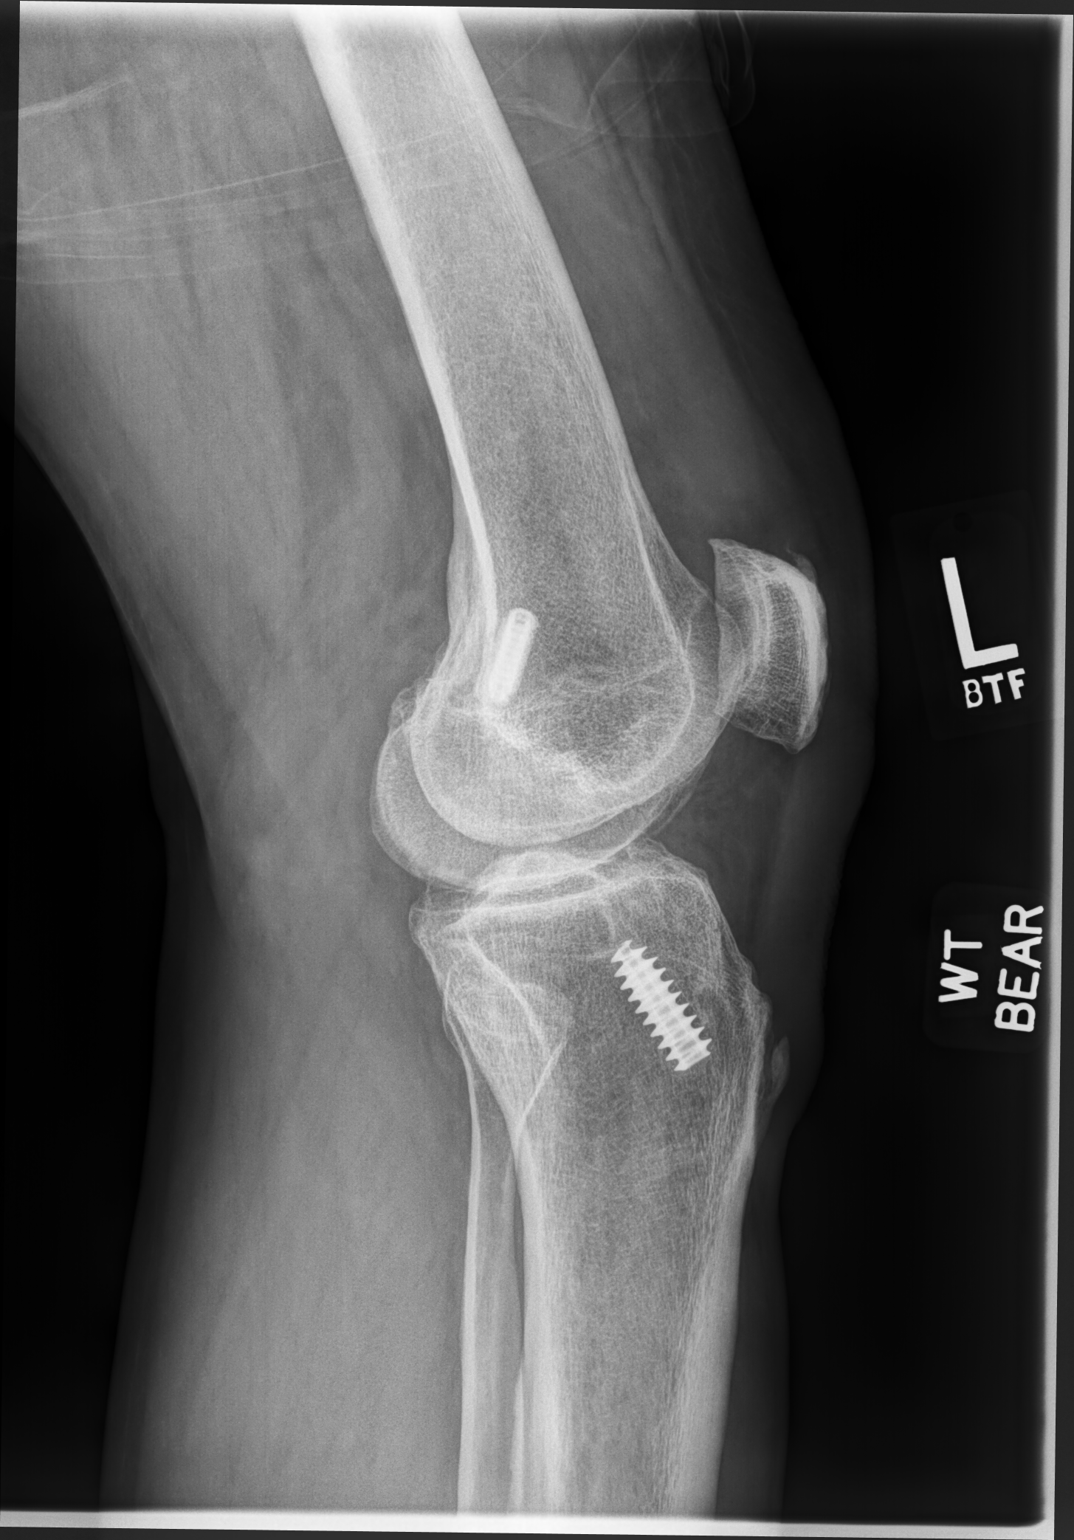

[sunrise]
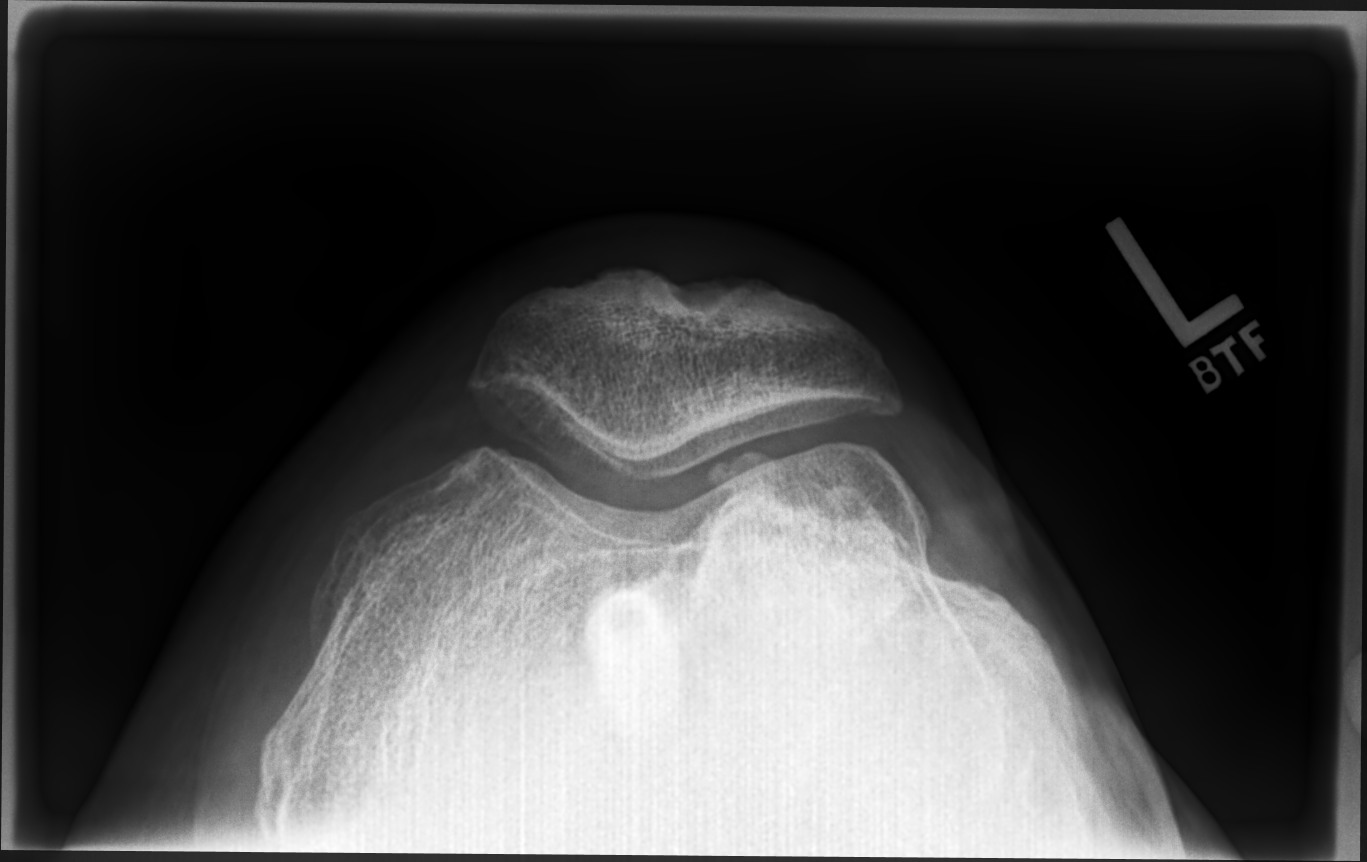

[3 of 3 positions shown; findings below may reference images not displayed]

FINDINGS: Joint space narrowing and spurring mediolateral and patellofemoral
joints. Negative for fracture. Small joint effusion. Prior ACL
appear with threaded screws in the distal femur and proximal tibia.
IMPRESSION: Moderate tricompartmental degenerative change.  Chronic ACL repair.

## 2019-09-21 MED FILL — BENAZEPRIL HCL 40 MG TABLET: 40 | 90 days supply | Qty: 90 | Fill #2

## 2019-10-14 ENCOUNTER — Other Ambulatory Visit: Payer: Self-pay | Admitting: Family Medicine

## 2019-10-14 DIAGNOSIS — E119 Type 2 diabetes mellitus without complications: Secondary | ICD-10-CM

## 2019-10-14 MED FILL — metFORMIN HCL ER 500 MG TB2: 500 | 90 days supply | Qty: 360 | Fill #0

## 2019-11-11 ENCOUNTER — Telehealth: Payer: Self-pay | Admitting: Family Medicine

## 2019-11-11 ENCOUNTER — Other Ambulatory Visit: Payer: Self-pay

## 2019-11-11 DIAGNOSIS — E119 Type 2 diabetes mellitus without complications: Secondary | ICD-10-CM

## 2019-11-11 NOTE — Telephone Encounter (Signed)
Agent from [employer] called requesting Referral for pt for eye doctor. Stated pt has seen the doctor yearly.  Agent will call back tomorrow to speak with Judeth Cornfield. No contact information given.

## 2019-11-11 NOTE — Telephone Encounter (Signed)
Referral started  

## 2019-11-25 ENCOUNTER — Encounter: Payer: Self-pay | Admitting: Family Medicine

## 2019-11-25 ENCOUNTER — Other Ambulatory Visit: Payer: Self-pay

## 2019-11-25 ENCOUNTER — Ambulatory Visit (INDEPENDENT_AMBULATORY_CARE_PROVIDER_SITE_OTHER): Payer: No Typology Code available for payment source | Admitting: Family Medicine

## 2019-11-25 ENCOUNTER — Other Ambulatory Visit (HOSPITAL_COMMUNITY): Payer: Self-pay | Admitting: *Deleted

## 2019-11-25 VITALS — BP 138/88 | HR 87 | Temp 96.7°F | Ht 68.0 in | Wt 185.2 lb

## 2019-11-25 DIAGNOSIS — I10 Essential (primary) hypertension: Secondary | ICD-10-CM | POA: Diagnosis not present

## 2019-11-25 DIAGNOSIS — Z125 Encounter for screening for malignant neoplasm of prostate: Secondary | ICD-10-CM

## 2019-11-25 DIAGNOSIS — E119 Type 2 diabetes mellitus without complications: Secondary | ICD-10-CM | POA: Diagnosis not present

## 2019-11-25 DIAGNOSIS — E785 Hyperlipidemia, unspecified: Secondary | ICD-10-CM

## 2019-11-25 DIAGNOSIS — Z23 Encounter for immunization: Secondary | ICD-10-CM

## 2019-11-25 DIAGNOSIS — Z0000 Encounter for general adult medical examination without abnormal findings: Secondary | ICD-10-CM

## 2019-11-25 DIAGNOSIS — E1169 Type 2 diabetes mellitus with other specified complication: Secondary | ICD-10-CM | POA: Diagnosis not present

## 2019-11-25 LAB — HM DIABETES EYE EXAM

## 2019-11-25 MED FILL — COMBIGAN EYE DROPS: 0.2-0.5 | 75 days supply | Qty: 15 | Fill #0

## 2019-11-25 NOTE — Patient Instructions (Addendum)
Health Maintenance Due  Topic Date Due  . OPHTHALMOLOGY EXAM had today will send for notes  02/13/2019  . HEMOGLOBIN A1C ordered today  07/10/2019   Shingrix #1 today. Repeat injection in 2-6 months. Schedule a nurse visit for the 2nd injection before you leave today (at the check out desk). This vaccine has to be at least 2 weeks separated from covid 19 vaccine  high normal blood pressure- recommended home monitoring- prefer for him to average closer to 120/80 or at least <130/85  - let me know if above goal  No changes today unless labs lead [place] to make changes  Recommended follow up: 4 months if a1c over 7, 6 months if a1c is under 7  Mineral oil for ear full of wax Purchase mineral oil from laxative aisle Lay down on your side with ear that is bothering you facing up Use 3-4 drops with a dropper and place in ear for 30 seconds Place cotton swab outside of ear Turn to other side and allow this to drain Repeat 3-4 x a day Return to see [place] if not improving within a few days  Please stop by lab before you go If you do not have mychart- we will call you about results within 5 business days of [place] receiving them.  If you have mychart- we will send your results within 3 business days of [place] receiving them.  If abnormal or we want to clarify a result, we will call or mychart you to make sure you receive the message.  If you have questions or concerns or don't hear within 5 business days, please send [place] a message or call us.

## 2019-11-25 NOTE — Progress Notes (Signed)
Phone: [phone]   Subjective:  Patient presents today for their annual physical. Chief complaint-noted.   See problem oriented charting- Review of Systems  Constitutional: Negative for chills and fever.  HENT: Negative for ear pain, hearing loss, sinus pain and sore throat.   Eyes: Negative for blurred vision and double vision.  Respiratory: Negative for cough, shortness of breath and wheezing.   Cardiovascular: Negative for chest pain, palpitations and leg swelling.  Gastrointestinal: Positive for heartburn. Negative for constipation, nausea and vomiting.       Occ once every 3 months related to diet.   Genitourinary: Negative for dysuria, flank pain, frequency, hematuria and urgency.  Musculoskeletal: Negative for back pain, joint pain, myalgias and neck pain.  Skin: Negative for itching and rash.  Neurological: Negative for dizziness, sensory change, seizures, weakness and headaches.  Endo/Heme/Allergies: Does not bruise/bleed easily.  Psychiatric/Behavioral: Negative for depression, hallucinations, memory loss, substance abuse and suicidal ideas. The patient is not nervous/anxious and does not have insomnia.    The following were reviewed and entered/updated in epic: Past Medical History:  Diagnosis Date  . 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 Diabetes mellitus without complication ([place])   . Glaucoma    [facility]  . Hyperlipidemia   . Hypertension   . Post-operative nausea and vomiting    Patient Active Problem List   Diagnosis Date Noted  . 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 Type 2 diabetes mellitus without complication, without long-term current use of insulin ([place]) 05/30/2017    Priority: High  . Hypertension, essential 10/09/2017    Priority: Medium  . Hyperlipidemia associated with type 2 diabetes mellitus ([place]) 10/09/2017    Priority: Medium  . Glaucoma     Priority: Medium  . Status post  carpal tunnel release 10/24/2018    Priority: Low  . Carpal tunnel syndrome 07/09/2018    Priority: Low  . Degenerative arthritis of right knee 01/16/2018    Priority: Low  . Family history of colon cancer 10/09/2017    Priority: Low   Past Surgical History:  Procedure Laterality Date  . CARPAL TUNNEL RELEASE Left 10/10/2018   Procedure: LEFT CARPAL TUNNEL RELEASE;  Surgeon: Mcarthur Rossetti, MD;  Location: [place];  Service: Orthopedics;  Laterality: Left;  . COLONOSCOPY  2010  . KNEE ARTHROSCOPY W/ ACL RECONSTRUCTION Bilateral    both knees x1  . MANDIBLE FRACTURE SURGERY Bilateral   . TONSILECTOMY/ADENOIDECTOMY WITH MYRINGOTOMY     childhood    Family History  Problem Relation Age of Onset  . Myasthenia gravis Mother        complications from this  . Colon cancer Father        diagnosed 59- died within a year of diagnosis  . Diabetes Brother   . Hyperlipidemia Brother     Medications- reviewed and updated Current Outpatient Medications  Medication Sig Dispense Refill  . Accu-Chek FastClix Lancets MISC USE TO TEST BLOOD SUGAR ONCE DAILY AS DIRECTED 102 each 4  . aspirin EC 81 MG tablet Take 1 tablet by mouth daily.    . benazepril (LOTENSIN) 40 MG tablet TAKE 1 TABLET BY MOUTH ONCE DAILY 90 tablet 2  . blood glucose meter kit and supplies Dispense based on patient and insurance preference. Use daily as directed to test blood sugar (E11.9). 1 each 0  . COMBIGAN 0.2-0.5 % ophthalmic solution Place 1 drop into both eyes  2 (two) times daily.  3  . glimepiride (AMARYL) 4 MG tablet TAKE 1 & 1/2 TABLETS BY MOUTH DAILY 135 tablet 1  . glucose blood test strip USE TO CHECK BLOOD GLUCOSE DAILY. 100 each 0  . metFORMIN (GLUCOPHAGE-XR) 500 MG 24 hr tablet TAKE 2 TABLETS BY MOUTH TWICE DAILY 360 tablet 0  . pravastatin (PRAVACHOL) 40 MG tablet TAKE 1 TABLET BY MOUTH ONCE DAILY 90 tablet 1   No current facility-administered medications for this visit.     Allergies-reviewed and updated Allergies  Allergen Reactions  . Diphtheria Toxoid Swelling    Social History   Social History Narrative   Married. 3 kids. 4 soon to be 5 grandkids (first girl) in 2018.       Home remodeling. Wife works for [employer] for at least 2 more years in 2021. [employer]- geography major       Hobbies: time with family, beach, football (season tickets to [employer])   Objective  Objective:  BP 138/88   Pulse 87   Temp (!) 96.7 F (35.9 C) (Temporal)   Ht 5' 8"  (1.727 m)   Wt 185 lb 3.2 oz (84 kg)   SpO2 96%   BMI 28.16 kg/m  Gen: NAD, resting comfortably HEENT: Mask not removed due to covid 19. TM normal on right, left blocked somewhat by wax- visual portion normal Bridge of nose normal. Eyelids normal.  Neck: no thyromegaly or cervical lymphadenopathy  CV: RRR no murmurs rubs or gallops Lungs: CTAB no crackles, wheeze, rhonchi Abdomen: soft/nontender/nondistended/normal bowel sounds. No rebound or guarding.  Ext: no edema Skin: warm, dry Neuro: grossly normal, moves all extremities, PERRLA   Assessment and Plan  64 y.o. male presenting for annual physical.  Health Maintenance counseling: 1. Anticipatory guidance: Patient counseled regarding regular dental exams q6 months, eye exams yearly with history of glaucoma- had recent visit and things looked good,  avoiding smoking and second hand smoke , limiting alcohol to 2 beverages per day. 2 of either beer liquores or wine    2. Risk factor reduction:  Advised patient of need for regular exercise and diet rich and fruits and vegetables to reduce risk of heart attack and stroke. Exercise- 3-4 times a week for 30 minutes or more. . Diet-tries to eat healthy diet- he thinks he can reduce portion size.  Weight unfortunately up 12 pounds in the last year- stress eating with covid and being home more  Wt Readings from Last 3 Encounters:  11/25/19 185 lb 3.2 oz (84 kg)  01/07/19 173 lb (78.5 kg)  10/10/18 172 lb 9.9 oz  (78.3 kg)  3. Immunizations/screenings/ancillary studies-he is interested in COVID-19 vaccination when available for his phase.  Declined flu shot.  Discussed Shingrix and he opts in  Immunization History  Administered Date(s) Administered  . Pneumococcal Polysaccharide-23 10/09/2017   Health Maintenance Due  Topic Date Due  . OPHTHALMOLOGY EXAM-attempting to obtain copy of records 02/13/2019   4. Prostate cancer screening-  Will get PSA to trend the #  Lab Results  Component Value Date   PSA 1.55 08/14/2018   5. Colon cancer screening - next due 01/10/2023. Last one done 04/1782019 and results were normal.  Family history of colon cancer in father in late 58s 6. Skin cancer screening- advised regular sunscreen use. Denies worrisome, changing, or new skin lesions other than has two small areas on right arm and hand that he thinks are cyst- area on right arm looks like lipoma, on right  hand looks like potential callous on top of dupuytren contracture .  7. never smoker  Status of chronic or acute concerns   # Type 2 diabetes mellitus without complication, without long-term current use of insulin (HCC) S:controlled on metformin 1g BID XR and amaryl 4 mg in the past. Does not check readings at home much. He denies any any symptoms of high or low readings.  A/P: Hopefully controlled-update A1c with labs today- continue current meds for now   # Hyperlipidemia  S:reasonably controlled on pravastatin 40 mg. Denies any new muscle pain. Taking medications daily. Also takes aspirin Lab Results  Component Value Date   CHOL 160 08/14/2018   HDL 67.70 08/14/2018   LDLCALC 73 08/14/2018   LDLDIRECT 71.0 03/12/2018   TRIG 94.0 08/14/2018   CHOLHDL 2 08/14/2018   A/P: Discussed newer more aggressive lipid guidelines with LDL goal 70 or less-he was close to this on last check at 73-we will push for healthy eating/regular exercise to get him to goal  # Hypertension S:controlled on benazepril 31m.  Patient denies any chest pain, shortness of breath, changes or changes in vision. Does add salt to food. Tries to maintain a heart healthy diet.   BP Readings from Last 3 Encounters:  11/25/19 138/88  01/07/19 123/81  10/10/18 107/78   A/P: Poor control initial check.  On repeat had improved but high normal blood pressure- recommended home monitoring- prefer for him to average closer to 120/80 or at least <130/85 . Continue current meds for now  Recommended follow up: Return in about 4 months (around 03/26/2020) for follow up- or sooner if needed.  Lab/Order associations: Over 7 hours since last food -last ate around 6:30am    ICD-10-CM   1. Preventative health care  Z00.00   2. Hypertension, essential  I10 CBC with Differential/Platelet    Comprehensive metabolic panel  3. Type 2 diabetes mellitus without complication, without long-term current use of insulin (HCC)  E11.9 Hemoglobin A1c  4. Hyperlipidemia associated with type 2 diabetes mellitus (HCC)  E11.69 Lipid panel   E78.5   5. Screening for prostate cancer  Z12.5 PSA    No orders of the defined types were placed in this encounter.   Return precautions advised.  SGarret Reddish MD

## 2019-11-25 NOTE — Addendum Note (Signed)
Addended by: Lieutenant Diego A on: 11/25/2019 03:58 PM   Modules accepted: Orders

## 2019-11-26 ENCOUNTER — Other Ambulatory Visit: Payer: Self-pay

## 2019-11-26 LAB — HEMOGLOBIN A1C: Hgb A1c MFr Bld: 8.3 % — ABNORMAL HIGH (ref 4.6–6.5)

## 2019-11-26 LAB — CBC WITH DIFFERENTIAL/PLATELET
Basophils Absolute: 0.1 10*3/uL (ref 0.0–0.1)
Basophils Relative: 1.2 % (ref 0.0–3.0)
Eosinophils Absolute: 0.4 10*3/uL (ref 0.0–0.7)
Eosinophils Relative: 5.4 % — ABNORMAL HIGH (ref 0.0–5.0)
HCT: 43.6 % (ref 39.0–52.0)
Hemoglobin: 15.1 g/dL (ref 13.0–17.0)
Lymphocytes Relative: 37.5 % (ref 12.0–46.0)
Lymphs Abs: 3.1 10*3/uL (ref 0.7–4.0)
MCHC: 34.5 g/dL (ref 30.0–36.0)
MCV: 91 fl (ref 78.0–100.0)
Monocytes Absolute: 0.5 10*3/uL (ref 0.1–1.0)
Monocytes Relative: 6.5 % (ref 3.0–12.0)
Neutro Abs: 4 10*3/uL (ref 1.4–7.7)
Neutrophils Relative %: 49.4 % (ref 43.0–77.0)
Platelets: 270 10*3/uL (ref 150.0–400.0)
RBC: 4.79 Mil/uL (ref 4.22–5.81)
RDW: 13.1 % (ref 11.5–15.5)
WBC: 8.2 10*3/uL (ref 4.0–10.5)

## 2019-11-26 LAB — COMPREHENSIVE METABOLIC PANEL
ALT: 14 U/L (ref 0–53)
AST: 13 U/L (ref 0–37)
Albumin: 4.3 g/dL (ref 3.5–5.2)
Alkaline Phosphatase: 56 U/L (ref 39–117)
BUN: 20 mg/dL (ref 6–23)
CO2: 27 mEq/L (ref 19–32)
Calcium: 9.8 mg/dL (ref 8.4–10.5)
Chloride: 101 mEq/L (ref 96–112)
Creatinine, Ser: 0.98 mg/dL (ref 0.40–1.50)
GFR: 77.1 mL/min (ref >60.00)
Glucose, Bld: 123 mg/dL — ABNORMAL HIGH (ref 70–99)
Potassium: 4.3 mEq/L (ref 3.5–5.1)
Sodium: 137 mEq/L (ref 135–145)
Total Bilirubin: 0.8 mg/dL (ref 0.2–1.2)
Total Protein: 7.1 g/dL (ref 6.0–8.3)

## 2019-11-26 LAB — LIPID PANEL
Cholesterol: 209 mg/dL — ABNORMAL HIGH (ref 0–200)
HDL: 66.3 mg/dL (ref >39.00)
LDL Cholesterol: 113 mg/dL — ABNORMAL HIGH (ref 0–99)
NonHDL: 142.9
Total CHOL/HDL Ratio: 3
Triglycerides: 151 mg/dL — ABNORMAL HIGH (ref 0.0–149.0)
VLDL: 30.2 mg/dL (ref 0.0–40.0)

## 2019-11-26 LAB — PSA: PSA: 1.48 ng/mL (ref 0.10–4.00)

## 2019-11-26 MED ORDER — ROSUVASTATIN CALCIUM 20 MG PO TABS: 20.0000 mg | ORAL_TABLET | Freq: Every day | ORAL | 3 refills | Status: DC

## 2019-11-26 MED ORDER — JARDIANCE 10 MG PO TABS: 10.0000 mg | ORAL_TABLET | Freq: Every day | ORAL | 5 refills | Status: DC

## 2019-11-26 MED FILL — JARDIANCE 10 MG TABLET: 10 | 30 days supply | Qty: 30 | Fill #0

## 2019-11-26 MED FILL — ROSUVASTATIN CALCIUM 20 MG: 20 | 90 days supply | Qty: 90 | Fill #0

## 2019-12-11 ENCOUNTER — Ambulatory Visit: Payer: No Typology Code available for payment source | Attending: Internal Medicine

## 2019-12-11 DIAGNOSIS — Z23 Encounter for immunization: Secondary | ICD-10-CM

## 2019-12-11 NOTE — Progress Notes (Signed)
   Covid-19 Vaccination Clinic  Name:  Chad Morton    MRN: [id number] DOB: 1955/12/18  12/11/2019  Chad Morton was observed post Covid-19 immunization for 15 minutes without incident. He was provided with Vaccine Information Sheet and instruction to access the V-Safe system.   Chad Morton was instructed to call 911 with any severe reactions post vaccine: Marland Kitchen Difficulty breathing  . Swelling of face and throat  . A fast heartbeat  . A bad rash all over body  . Dizziness and weakness   Immunizations Administered    Name Date Dose VIS Date Route   Pfizer COVID-19 Vaccine 12/11/2019  8:59 AM 0.3 mL 09/05/2019 Intramuscular   Manufacturer: [employer], [employer]   Lot: [id number]   NDC: [address]-1

## 2019-12-17 ENCOUNTER — Encounter: Payer: Self-pay | Admitting: Family Medicine

## 2019-12-20 ENCOUNTER — Other Ambulatory Visit: Payer: Self-pay | Admitting: Family Medicine

## 2019-12-20 DIAGNOSIS — I10 Essential (primary) hypertension: Secondary | ICD-10-CM

## 2019-12-22 MED FILL — BENAZEPRIL HCL 40 MG TABLET: 40 | 90 days supply | Qty: 90 | Fill #0

## 2019-12-23 MED FILL — JARDIANCE 10 MG TABLET: 10 | 30 days supply | Qty: 30 | Fill #1

## 2020-01-05 ENCOUNTER — Ambulatory Visit: Payer: No Typology Code available for payment source | Attending: Internal Medicine

## 2020-01-05 DIAGNOSIS — Z23 Encounter for immunization: Secondary | ICD-10-CM

## 2020-01-05 NOTE — Progress Notes (Signed)
   Covid-19 Vaccination Clinic  Name:  DANTONIO JUSTEN    MRN: [id number] DOB: 17-Mar-1956  01/05/2020  Mr. Erck was observed post Covid-19 immunization for 15 minutes without incident. He was provided with Vaccine Information Sheet and instruction to access the V-Safe system.   Mr. Scouten was instructed to call 911 with any severe reactions post vaccine: Marland Kitchen Difficulty breathing  . Swelling of face and throat  . A fast heartbeat  . A bad rash all over body  . Dizziness and weakness   Immunizations Administered    Name Date Dose VIS Date Route   Pfizer COVID-19 Vaccine 01/05/2020  8:17 AM 0.3 mL 09/05/2019 Intramuscular   Manufacturer: [employer], [employer]   Lot: [id number]   NDC: [address]-4

## 2020-01-28 ENCOUNTER — Other Ambulatory Visit: Payer: Self-pay

## 2020-01-28 MED ORDER — ACCU-CHEK FASTCLIX LANCETS MISC: 4 refills | Status: DC

## 2020-01-28 MED ORDER — GLUCOSE BLOOD VI STRP: ORAL_STRIP | 3 refills | Status: DC

## 2020-01-28 MED FILL — JARDIANCE 10 MG TABLET: 10 | 30 days supply | Qty: 30 | Fill #2

## 2020-01-28 MED FILL — FREESTYLE LANCETS: 90 days supply | Qty: 100 | Fill #0

## 2020-01-28 MED FILL — FREESTYLE LITE METER: 30 days supply | Qty: 1 | Fill #0

## 2020-01-28 MED FILL — FREESTYLE LITE TEST STRIP: 90 days supply | Qty: 100 | Fill #0

## 2020-01-28 NOTE — Progress Notes (Signed)
accu ch

## 2020-02-09 ENCOUNTER — Other Ambulatory Visit: Payer: Self-pay | Admitting: Family Medicine

## 2020-02-09 DIAGNOSIS — E119 Type 2 diabetes mellitus without complications: Secondary | ICD-10-CM

## 2020-02-09 MED FILL — metFORMIN HCL ER 500 MG TB2: 500 | 90 days supply | Qty: 360 | Fill #0

## 2020-02-25 MED FILL — ROSUVASTATIN CALCIUM 20 MG: 20 | 90 days supply | Qty: 90 | Fill #1

## 2020-02-25 MED FILL — JARDIANCE 10 MG TABLET: 10 | 30 days supply | Qty: 30 | Fill #3

## 2020-03-15 MED FILL — BENAZEPRIL HCL 40 MG TABLET: 40 | 90 days supply | Qty: 90 | Fill #1

## 2020-03-15 MED FILL — COMBIGAN EYE DROPS: 0.2-0.5 | 75 days supply | Qty: 15 | Fill #1

## 2020-03-16 ENCOUNTER — Encounter: Payer: Self-pay | Admitting: Family Medicine

## 2020-03-23 ENCOUNTER — Other Ambulatory Visit: Payer: No Typology Code available for payment source

## 2020-03-25 MED FILL — JARDIANCE 10 MG TABLET: 10 | 30 days supply | Qty: 30 | Fill #4

## 2020-03-25 MED FILL — GLIMEPIRIDE 4 MG TABS: 4 | 90 days supply | Qty: 135 | Fill #1

## 2020-04-22 MED FILL — JARDIANCE 10 MG TABLET: 10 | 30 days supply | Qty: 30 | Fill #5

## 2020-05-10 ENCOUNTER — Other Ambulatory Visit: Payer: Self-pay | Admitting: Family Medicine

## 2020-05-10 DIAGNOSIS — E119 Type 2 diabetes mellitus without complications: Secondary | ICD-10-CM

## 2020-05-10 MED FILL — metFORMIN HCL ER 500 MG TB2: 500 | 90 days supply | Qty: 360 | Fill #0

## 2020-05-19 NOTE — Progress Notes (Signed)
Phone [phone] In person visit   Subjective:   Chad Morton is a 64 y.o. year old very pleasant male patient who presents for/with See problem oriented charting Chief Complaint  Patient presents with  . Follow-up   This visit occurred during the SARS-CoV-2 public health emergency.  Safety protocols were in place, including screening questions prior to the visit, additional usage of staff PPE, and extensive cleaning of exam room while observing appropriate contact time as indicated for disinfecting solutions.   Past Medical History-  Patient Active Problem List   Diagnosis Date Noted  . 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 Type 2 diabetes mellitus without complication, without long-term current use of insulin ([place]) 05/30/2017    Priority: High  . Hypertension, essential 10/09/2017    Priority: Medium  . Hyperlipidemia associated with type 2 diabetes mellitus ([place]) 10/09/2017    Priority: Medium  . Glaucoma     Priority: Medium  . Status post carpal tunnel release 10/24/2018    Priority: Low  . Carpal tunnel syndrome 07/09/2018    Priority: Low  . Degenerative arthritis of right knee 01/16/2018    Priority: Low  . Family history of colon cancer 10/09/2017    Priority: Low    Medications- reviewed and updated Current Outpatient Medications  Medication Sig Dispense Refill  . Accu-Chek FastClix Lancets MISC USE TO TEST BLOOD SUGAR ONCE DAILY AS DIRECTED 102 each 4  . aspirin EC 81 MG tablet Take 1 tablet by mouth daily.    . benazepril (LOTENSIN) 40 MG tablet TAKE 1 TABLET BY MOUTH ONCE DAILY 90 tablet 2  . blood glucose meter kit and supplies Dispense based on patient and insurance preference. Use daily as directed to test blood sugar (E11.9). 1 each 0  . COMBIGAN 0.2-0.5 % ophthalmic solution Place 1 drop into both eyes 2 (two) times daily.  3  . empagliflozin (JARDIANCE) 10 MG TABS tablet Take 10 mg by mouth daily before breakfast. 30  tablet 5  . glimepiride (AMARYL) 4 MG tablet Take 1 tablet (4 mg total) by mouth daily with breakfast. 90 tablet 3  . glucose blood test strip USE TO CHECK BLOOD GLUCOSE DAILY. 100 each 0  . glucose blood test strip Once daily dx e11.9 100 each 3  . metFORMIN (GLUCOPHAGE-XR) 500 MG 24 hr tablet TAKE 2 TABLETS BY MOUTH TWO TIMES DAILY 360 tablet 3  . rosuvastatin (CRESTOR) 20 MG tablet Take 1 tablet (20 mg total) by mouth daily. 90 tablet 3   No current facility-administered medications for this visit.     Objective:  BP 138/78   Pulse 86   Temp 98.3 F (36.8 C) (Temporal)   Ht 5' 8"  (1.727 m)   Wt 179 lb 3.2 oz (81.3 kg)   SpO2 99%   BMI 27.25 kg/m  Gen: NAD, resting comfortably CV: RRR no murmurs rubs or gallops Lungs: CTAB no crackles, wheeze, rhonchi Abdomen: soft/nontender/nondistended/normal bowel sounds Ext: no edema Skin: warm, dry     Assessment and Plan   # Diabetes-peak A1c of 8.26 November 2019 S: Medication: jardiance 10 mg, metformin 1031m BID,  glimepride 4 mg Exercise and diet-exercise has been down-feels like he could improve his diet A/P: Hopefully controlled-will come back for A1c-our lab was closed due to me running behind today-I apologize to patient  #hyperlipidemia S: Medication:rosuvastatin 225m aspirin 8163m A/P:  doing well on higher dose-hopefully controlled- will come back for direct LDL  testing.   #hypertension S: medication: benazepril 65m Home readings #s: slightly lower at home than reading today BP Readings from Last 3 Encounters:  05/20/20 138/78  11/25/19 138/88  01/07/19 123/81  A/P:  Stable but high normal systolic reading. Continue current medications.  - feels could increase exercise and improve diet some  Recommended follow up: Return in about 27 weeks (around 11/25/2020) for physical or sooner if needed.  Lab/Order associations:   ICD-10-CM   1. Type 2 diabetes mellitus without complication, without long-term current use of  insulin (HCC)  E11.9 LDL cholesterol, direct    COMPLETE METABOLIC PANEL WITH GFR    Hemoglobin A1c  2. Hypertension, essential  I10   3. Hyperlipidemia associated with type 2 diabetes mellitus [place]  E11.69    E78.5   4. Controlled type 2 diabetes mellitus without complication, without long-term current use of insulin (HCC)  E11.9 metFORMIN (GLUCOPHAGE-XR) 500 MG 24 hr tablet    glimepiride (AMARYL) 4 MG tablet    Meds ordered this encounter  Medications  . metFORMIN (GLUCOPHAGE-XR) 500 MG 24 hr tablet    Sig: TAKE 2 TABLETS BY MOUTH TWO TIMES DAILY    Dispense:  360 tablet    Refill:  3  . glimepiride (AMARYL) 4 MG tablet    Sig: Take 1 tablet (4 mg total) by mouth daily with breakfast.    Dispense:  90 tablet    Refill:  3     Return precautions advised.  SGarret Reddish MD

## 2020-05-19 NOTE — Patient Instructions (Addendum)
Health Maintenance Due  Topic Date Due  . INFLUENZA VACCINE - declined 04/25/2020   Come back for labs within 2 weeks. Call back if no one at desk.   Hopefully no changes as long as labs look good.

## 2020-05-20 ENCOUNTER — Other Ambulatory Visit: Payer: Self-pay

## 2020-05-20 ENCOUNTER — Ambulatory Visit (INDEPENDENT_AMBULATORY_CARE_PROVIDER_SITE_OTHER): Payer: No Typology Code available for payment source | Admitting: Family Medicine

## 2020-05-20 ENCOUNTER — Other Ambulatory Visit: Payer: Self-pay | Admitting: Family Medicine

## 2020-05-20 ENCOUNTER — Encounter: Payer: Self-pay | Admitting: Family Medicine

## 2020-05-20 VITALS — BP 138/78 | HR 86 | Temp 98.3°F | Ht 68.0 in | Wt 179.2 lb

## 2020-05-20 DIAGNOSIS — I10 Essential (primary) hypertension: Secondary | ICD-10-CM | POA: Diagnosis not present

## 2020-05-20 DIAGNOSIS — E1169 Type 2 diabetes mellitus with other specified complication: Secondary | ICD-10-CM | POA: Diagnosis not present

## 2020-05-20 DIAGNOSIS — E119 Type 2 diabetes mellitus without complications: Secondary | ICD-10-CM

## 2020-05-20 DIAGNOSIS — E785 Hyperlipidemia, unspecified: Secondary | ICD-10-CM

## 2020-05-20 MED ORDER — METFORMIN HCL ER 500 MG PO TB24: ORAL_TABLET | ORAL | 3 refills | Status: DC

## 2020-05-20 MED ORDER — GLIMEPIRIDE 4 MG PO TABS: 4.0000 mg | ORAL_TABLET | Freq: Every day | ORAL | 3 refills | Status: DC

## 2020-05-25 ENCOUNTER — Other Ambulatory Visit: Payer: Self-pay | Admitting: Family Medicine

## 2020-05-25 MED FILL — ROSUVASTATIN CALCIUM 20 MG: 20 | 90 days supply | Qty: 90 | Fill #2

## 2020-05-25 MED FILL — JARDIANCE 10 MG TABLET: 10 | 30 days supply | Qty: 30 | Fill #0

## 2020-05-26 ENCOUNTER — Other Ambulatory Visit: Payer: No Typology Code available for payment source

## 2020-05-26 ENCOUNTER — Other Ambulatory Visit: Payer: Self-pay

## 2020-05-26 DIAGNOSIS — E119 Type 2 diabetes mellitus without complications: Secondary | ICD-10-CM

## 2020-05-27 LAB — COMPLETE METABOLIC PANEL WITH GFR
AG Ratio: 1.9 (calc) (ref 1.0–2.5)
ALT: 17 U/L (ref 9–46)
AST: 17 U/L (ref 10–35)
Albumin: 4.4 g/dL (ref 3.6–5.1)
Alkaline phosphatase (APISO): 41 U/L (ref 35–144)
BUN: 19 mg/dL (ref 7–25)
CO2: 25 mmol/L (ref 20–32)
Calcium: 9.3 mg/dL (ref 8.6–10.3)
Chloride: 100 mmol/L (ref 98–110)
Creat: 1.1 mg/dL (ref 0.70–1.25)
GFR, Est African American: 82 mL/min/{1.73_m2} (ref >=60)
GFR, Est Non African American: 71 mL/min/{1.73_m2} (ref >=60)
Globulin: 2.3 g/dL (calc) (ref 1.9–3.7)
Glucose, Bld: 277 mg/dL — ABNORMAL HIGH (ref 65–99)
Potassium: 4.7 mmol/L (ref 3.5–5.3)
Sodium: 136 mmol/L (ref 135–146)
Total Bilirubin: 0.8 mg/dL (ref 0.2–1.2)
Total Protein: 6.7 g/dL (ref 6.1–8.1)

## 2020-05-27 LAB — HEMOGLOBIN A1C
Hgb A1c MFr Bld: 7.1 % of total Hgb — ABNORMAL HIGH (ref <5.7)
Mean Plasma Glucose: 157 (calc)
eAG (mmol/L): 8.7 (calc)

## 2020-05-27 LAB — LDL CHOLESTEROL, DIRECT: Direct LDL: 56 mg/dL (ref <100)

## 2020-06-22 MED FILL — JARDIANCE 10 MG TABLET: 10 | 30 days supply | Qty: 30 | Fill #1

## 2020-06-22 MED FILL — FREESTYLE LITE TEST STRIP: 90 days supply | Qty: 100 | Fill #1

## 2020-06-22 MED FILL — FREESTYLE LANCETS: 90 days supply | Qty: 100 | Fill #1

## 2020-06-22 MED FILL — COMBIGAN EYE DROPS: 0.2-0.5 | 75 days supply | Qty: 15 | Fill #2

## 2020-06-23 MED FILL — BENAZEPRIL HCL 40 MG TABLET: 40 | 90 days supply | Qty: 90 | Fill #2

## 2020-07-20 ENCOUNTER — Encounter: Payer: Self-pay | Admitting: Family Medicine

## 2020-07-20 ENCOUNTER — Other Ambulatory Visit: Payer: Self-pay

## 2020-07-20 MED FILL — JARDIANCE 10 MG TABLET: 10 | 30 days supply | Qty: 30 | Fill #2

## 2020-08-12 MED FILL — GLIMEPIRIDE 4 MG TABS: 4 | 90 days supply | Qty: 90 | Fill #0

## 2020-08-12 MED FILL — metFORMIN HCL ER 500 MG TB2: 500 | 90 days supply | Qty: 360 | Fill #0

## 2020-08-24 MED FILL — ROSUVASTATIN CALCIUM 20 MG: 20 | 90 days supply | Qty: 90 | Fill #3

## 2020-08-24 MED FILL — JARDIANCE 10 MG TABLET: 10 | 30 days supply | Qty: 30 | Fill #3

## 2020-08-27 ENCOUNTER — Encounter: Payer: Self-pay | Admitting: Family Medicine

## 2020-09-15 ENCOUNTER — Other Ambulatory Visit: Payer: Self-pay | Admitting: Family Medicine

## 2020-09-15 DIAGNOSIS — I10 Essential (primary) hypertension: Secondary | ICD-10-CM

## 2020-09-15 MED FILL — BENAZEPRIL HCL 40 MG TABLET: 40 | 90 days supply | Qty: 90 | Fill #0

## 2020-09-28 MED FILL — JARDIANCE 10 MG TABLET: 10 | 30 days supply | Qty: 30 | Fill #4

## 2020-09-28 MED FILL — COMBIGAN EYE DROPS: 0.2-0.5 | 75 days supply | Qty: 15 | Fill #3

## 2020-10-28 MED FILL — JARDIANCE 10 MG TABLET: 10 | 30 days supply | Qty: 30 | Fill #5

## 2020-11-11 MED FILL — metFORMIN HCL ER 500 MG TB2: 500 | 90 days supply | Qty: 360 | Fill #1

## 2020-11-11 MED FILL — GLIMEPIRIDE 4 MG TABS: 4 | 90 days supply | Qty: 90 | Fill #1

## 2020-11-29 ENCOUNTER — Other Ambulatory Visit: Payer: Self-pay | Admitting: Family Medicine

## 2020-11-29 MED FILL — JARDIANCE 10 MG TABLET: 10 | 30 days supply | Qty: 30 | Fill #0

## 2020-11-29 MED FILL — ROSUVASTATIN CALCIUM 20 MG: 20 | 90 days supply | Qty: 90 | Fill #0

## 2020-12-14 ENCOUNTER — Other Ambulatory Visit (HOSPITAL_BASED_OUTPATIENT_CLINIC_OR_DEPARTMENT_OTHER): Payer: Self-pay

## 2020-12-29 ENCOUNTER — Other Ambulatory Visit (HOSPITAL_COMMUNITY): Payer: Self-pay

## 2020-12-29 MED FILL — Empagliflozin Tab 10 MG: ORAL | 30 days supply | Qty: 30 | Fill #0 | Status: AC

## 2021-01-25 ENCOUNTER — Other Ambulatory Visit (HOSPITAL_COMMUNITY): Payer: Self-pay

## 2021-01-25 ENCOUNTER — Other Ambulatory Visit: Payer: Self-pay

## 2021-01-25 ENCOUNTER — Telehealth (INDEPENDENT_AMBULATORY_CARE_PROVIDER_SITE_OTHER): Payer: No Typology Code available for payment source | Admitting: Family

## 2021-01-25 ENCOUNTER — Encounter: Payer: Self-pay | Admitting: Family

## 2021-01-25 ENCOUNTER — Other Ambulatory Visit: Payer: Self-pay | Admitting: Family

## 2021-01-25 VITALS — Ht 68.0 in | Wt 179.2 lb

## 2021-01-25 DIAGNOSIS — E119 Type 2 diabetes mellitus without complications: Secondary | ICD-10-CM

## 2021-01-25 DIAGNOSIS — J302 Other seasonal allergic rhinitis: Secondary | ICD-10-CM | POA: Diagnosis not present

## 2021-01-25 MED ORDER — PREDNISONE 10 MG (21) PO TBPK
ORAL_TABLET | ORAL | 0 refills | Status: DC
  Filled 2021-01-25: qty 21, 6d supply, fill #0

## 2021-01-25 MED ORDER — METHYLPREDNISOLONE 4 MG PO TABS
4.0000 mg | ORAL_TABLET | Freq: Every day | ORAL | 0 refills | Status: DC
  Filled 2021-01-25: qty 21, 21d supply, fill #0

## 2021-01-25 MED FILL — Empagliflozin Tab 10 MG: ORAL | 30 days supply | Qty: 30 | Fill #1 | Status: AC

## 2021-01-25 MED FILL — Benazepril HCl Tab 40 MG: ORAL | 90 days supply | Qty: 90 | Fill #0 | Status: AC

## 2021-01-25 NOTE — Progress Notes (Signed)
Virtual Visit via Video   I connected with patient on 01/25/21 at  2:00 PM EDT by a video enabled telemedicine application and verified that I am speaking with the correct person using two identifiers.  Location patient: Home Location provider: [place], Office Persons participating in the virtual visit: Chad Morton/Chad Morton  I discussed the limitations of evaluation and management by telemedicine and the availability of in person appointments. The patient expressed understanding and agreed to proceed.  Subjective:   HPI:   65 year old male presents today with complaints of sneezing, nasal congestion x1 week that occurred after blowing leaves.  Has been using an over-the-counter nasal spray that has not helped.  He has taken 2 rapid antigen COVID test that have been negative.  Denies any sinus pressure, sinus pain, or pain in his teeth.  He is vaccinated and boosted.  ROS:   See pertinent positives and negatives per HPI.  Patient Active Problem List   Diagnosis Date Noted  . Status post carpal tunnel release 10/24/2018  . Carpal tunnel syndrome 07/09/2018  . Degenerative arthritis of right knee 01/16/2018  . 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 Family history of colon cancer 10/09/2017  . Hypertension, essential 10/09/2017  . Hyperlipidemia associated with type 2 diabetes mellitus ([place]) 10/09/2017  . Glaucoma   . Type 2 diabetes mellitus without complication, without long-term current use of insulin ([place]) 05/30/2017    Social History   Tobacco Use  . Smoking status: Never Smoker  . Smokeless tobacco: Never Used  Substance Use Topics  . Alcohol use: Yes    Alcohol/week: 10.0 - 12.0 standard drinks    Types: 10 - 12 Standard drinks or equivalent per week    Current Outpatient Medications:  .  Accu-Chek FastClix Lancets MISC, USE TO TEST BLOOD SUGAR ONCE DAILY AS DIRECTED, Disp: 102 each, Rfl: 4 .  aspirin EC 81 MG tablet, Take 1  tablet by mouth daily., Disp: , Rfl:  .  benazepril (LOTENSIN) 40 MG tablet, TAKE 1 TABLET BY MOUTH ONCE DAILY, Disp: 90 tablet, Rfl: 2 .  blood glucose meter kit and supplies, Dispense based on patient and insurance preference. Use daily as directed to test blood sugar (E11.9)., Disp: 1 each, Rfl: 0 .  COMBIGAN 0.2-0.5 % ophthalmic solution, Place 1 drop into both eyes 2 (two) times daily., Disp: , Rfl: 3 .  empagliflozin (JARDIANCE) 10 MG TABS tablet, TAKE 1 TABLET BY MOUTH ONCE DAILY BEFORE BREAKFAST, Disp: 30 tablet, Rfl: 5 .  glimepiride (AMARYL) 4 MG tablet, TAKE 1 TABLET BY MOUTH ONCE DAILY WITH BREAKFAST, Disp: 90 tablet, Rfl: 3 .  glucose blood test strip, USE TO CHECK BLOOD GLUCOSE DAILY., Disp: 100 each, Rfl: 0 .  glucose blood test strip, Once daily dx e11.9, Disp: 100 each, Rfl: 3 .  metFORMIN (GLUCOPHAGE-XR) 500 MG 24 hr tablet, TAKE 2 TABLETS BY MOUTH TWICE DAILY, Disp: 360 tablet, Rfl: 3 .  methylPREDNISolone (MEDROL) 4 MG tablet, Take 1 tablet (4 mg total) by mouth daily., Disp: 21 tablet, Rfl: 0 .  rosuvastatin (CRESTOR) 20 MG tablet, TAKE 1 TABLET BY MOUTH ONCE DAILY, Disp: 90 tablet, Rfl: 3  Allergies  Allergen Reactions  . Diphtheria Toxoid Swelling    Objective:   Ht 5' 8"  (1.727 m)   Wt 179 lb 3.7 oz (81.3 kg)   BMI 27.25 kg/m   Patient is well-developed, well-nourished in no acute distress.  Resting comfortably at home.  Head is normocephalic,  atraumatic.  No labored breathing.  Speech is clear and coherent with logical content.    Assessment and Plan:    Chad Morton was seen today for cough and nasal congestion.  Diagnoses and all orders for this visit:  Seasonal allergies  Type 2 diabetes mellitus without complication, without long-term current use of insulin (HCC)  Other orders -     methylPREDNISolone (MEDROL) 4 MG tablet; Take 1 tablet (4 mg total) by mouth daily.   Last A1c was 7.1.  Due to his history of diabetes advised that he could see more  elevated blood glucose levels over the next few days.  Drink plenty of fluids.  Call the office with any questions or concerns.  Kennyth Arnold, FNP 01/25/2021

## 2021-01-27 ENCOUNTER — Other Ambulatory Visit (HOSPITAL_COMMUNITY): Payer: Self-pay

## 2021-01-28 ENCOUNTER — Other Ambulatory Visit (HOSPITAL_COMMUNITY): Payer: Self-pay

## 2021-01-31 ENCOUNTER — Other Ambulatory Visit (HOSPITAL_COMMUNITY): Payer: Self-pay

## 2021-02-02 ENCOUNTER — Other Ambulatory Visit (HOSPITAL_COMMUNITY): Payer: Self-pay

## 2021-02-03 ENCOUNTER — Other Ambulatory Visit (HOSPITAL_COMMUNITY): Payer: Self-pay

## 2021-02-09 ENCOUNTER — Other Ambulatory Visit (HOSPITAL_COMMUNITY): Payer: Self-pay

## 2021-02-09 MED ORDER — COMBIGAN 0.2-0.5 % OP SOLN
OPHTHALMIC | 0 refills | Status: DC
  Filled 2021-02-09: qty 15, 90d supply, fill #0
  Filled 2021-02-14: qty 15, 75d supply, fill #0

## 2021-02-10 ENCOUNTER — Other Ambulatory Visit (HOSPITAL_COMMUNITY): Payer: Self-pay

## 2021-02-10 MED FILL — Glimepiride Tab 4 MG: ORAL | 90 days supply | Qty: 90 | Fill #0 | Status: AC

## 2021-02-10 MED FILL — Metformin HCl Tab ER 24HR 500 MG: ORAL | 90 days supply | Qty: 360 | Fill #0 | Status: AC

## 2021-02-12 ENCOUNTER — Other Ambulatory Visit (HOSPITAL_COMMUNITY): Payer: Self-pay

## 2021-02-14 ENCOUNTER — Other Ambulatory Visit (HOSPITAL_COMMUNITY): Payer: Self-pay

## 2021-02-14 MED ORDER — TIMOLOL MALEATE 0.5 % OP SOLN
OPHTHALMIC | 1 refills | Status: DC
  Filled 2021-02-14: qty 5, 30d supply, fill #0

## 2021-02-14 MED ORDER — BRIMONIDINE TARTRATE-TIMOLOL 0.2-0.5 % OP SOLN
OPHTHALMIC | 1 refills | Status: DC
  Filled 2021-02-14: qty 5, 25d supply, fill #0
  Filled 2021-03-01: qty 5, 25d supply, fill #1

## 2021-02-14 MED ORDER — BRIMONIDINE TARTRATE 0.2 % OP SOLN
OPHTHALMIC | 1 refills | Status: DC
  Filled 2021-02-14: qty 5, 30d supply, fill #0

## 2021-03-01 ENCOUNTER — Other Ambulatory Visit (HOSPITAL_COMMUNITY): Payer: Self-pay

## 2021-03-01 MED FILL — Rosuvastatin Calcium Tab 20 MG: ORAL | 90 days supply | Qty: 90 | Fill #0 | Status: AC

## 2021-03-01 MED FILL — Empagliflozin Tab 10 MG: ORAL | 30 days supply | Qty: 30 | Fill #2 | Status: AC

## 2021-03-07 ENCOUNTER — Other Ambulatory Visit (HOSPITAL_COMMUNITY): Payer: Self-pay

## 2021-04-01 ENCOUNTER — Other Ambulatory Visit (HOSPITAL_COMMUNITY): Payer: Self-pay

## 2021-04-01 MED FILL — Empagliflozin Tab 10 MG: ORAL | 30 days supply | Qty: 30 | Fill #3 | Status: AC

## 2021-04-04 ENCOUNTER — Other Ambulatory Visit (HOSPITAL_COMMUNITY): Payer: Self-pay

## 2021-04-20 ENCOUNTER — Other Ambulatory Visit (HOSPITAL_COMMUNITY): Payer: Self-pay

## 2021-04-20 LAB — HM DIABETES EYE EXAM

## 2021-04-20 MED ORDER — DORZOLAMIDE HCL-TIMOLOL MAL 2-0.5 % OP SOLN
OPHTHALMIC | 0 refills | Status: DC
  Filled 2021-04-20: qty 10, 50d supply, fill #0
  Filled 2021-06-13: qty 5, 25d supply, fill #1

## 2021-04-20 MED ORDER — BRIMONIDINE TARTRATE 0.2 % OP SOLN
OPHTHALMIC | 0 refills | Status: DC
  Filled 2021-04-20: qty 15, 75d supply, fill #0

## 2021-04-21 ENCOUNTER — Encounter: Payer: Self-pay | Admitting: Family Medicine

## 2021-04-21 ENCOUNTER — Other Ambulatory Visit (HOSPITAL_COMMUNITY): Payer: Self-pay

## 2021-04-25 ENCOUNTER — Other Ambulatory Visit (HOSPITAL_COMMUNITY): Payer: Self-pay

## 2021-04-25 MED FILL — Glimepiride Tab 4 MG: ORAL | 90 days supply | Qty: 90 | Fill #1 | Status: AC

## 2021-04-25 MED FILL — Benazepril HCl Tab 40 MG: ORAL | 90 days supply | Qty: 90 | Fill #1 | Status: AC

## 2021-04-25 MED FILL — Metformin HCl Tab ER 24HR 500 MG: ORAL | 90 days supply | Qty: 360 | Fill #1 | Status: AC

## 2021-04-25 MED FILL — Empagliflozin Tab 10 MG: ORAL | 30 days supply | Qty: 30 | Fill #4 | Status: AC

## 2021-05-24 ENCOUNTER — Other Ambulatory Visit: Payer: Self-pay

## 2021-05-24 ENCOUNTER — Encounter: Payer: Self-pay | Admitting: Family Medicine

## 2021-05-24 ENCOUNTER — Ambulatory Visit (INDEPENDENT_AMBULATORY_CARE_PROVIDER_SITE_OTHER): Payer: PPO | Admitting: Family Medicine

## 2021-05-24 VITALS — BP 124/68 | HR 96 | Temp 98.0°F | Ht 68.0 in | Wt 182.0 lb

## 2021-05-24 DIAGNOSIS — Z125 Encounter for screening for malignant neoplasm of prostate: Secondary | ICD-10-CM | POA: Diagnosis not present

## 2021-05-24 DIAGNOSIS — Z23 Encounter for immunization: Secondary | ICD-10-CM | POA: Diagnosis not present

## 2021-05-24 DIAGNOSIS — E1169 Type 2 diabetes mellitus with other specified complication: Secondary | ICD-10-CM | POA: Diagnosis not present

## 2021-05-24 DIAGNOSIS — E785 Hyperlipidemia, unspecified: Secondary | ICD-10-CM | POA: Diagnosis not present

## 2021-05-24 DIAGNOSIS — Z79899 Other long term (current) drug therapy: Secondary | ICD-10-CM | POA: Diagnosis not present

## 2021-05-24 DIAGNOSIS — Z0000 Encounter for general adult medical examination without abnormal findings: Secondary | ICD-10-CM | POA: Diagnosis not present

## 2021-05-24 DIAGNOSIS — I10 Essential (primary) hypertension: Secondary | ICD-10-CM

## 2021-05-24 DIAGNOSIS — E119 Type 2 diabetes mellitus without complications: Secondary | ICD-10-CM | POA: Diagnosis not present

## 2021-05-24 DIAGNOSIS — H401132 Primary open-angle glaucoma, bilateral, moderate stage: Secondary | ICD-10-CM | POA: Diagnosis not present

## 2021-05-24 LAB — LIPID PANEL
Cholesterol: 145 mg/dL (ref 0–200)
HDL: 58.2 mg/dL (ref >39.00)
NonHDL: 86.56
Total CHOL/HDL Ratio: 2
Triglycerides: 223 mg/dL — ABNORMAL HIGH (ref 0.0–149.0)
VLDL: 44.6 mg/dL — ABNORMAL HIGH (ref 0.0–40.0)

## 2021-05-24 LAB — CBC WITH DIFFERENTIAL/PLATELET
Basophils Absolute: 0.1 10*3/uL (ref 0.0–0.1)
Basophils Relative: 0.7 % (ref 0.0–3.0)
Eosinophils Absolute: 0.5 10*3/uL (ref 0.0–0.7)
Eosinophils Relative: 6.8 % — ABNORMAL HIGH (ref 0.0–5.0)
HCT: 45.4 % (ref 39.0–52.0)
Hemoglobin: 15.4 g/dL (ref 13.0–17.0)
Lymphocytes Relative: 33.1 % (ref 12.0–46.0)
Lymphs Abs: 2.4 10*3/uL (ref 0.7–4.0)
MCHC: 33.9 g/dL (ref 30.0–36.0)
MCV: 91.6 fl (ref 78.0–100.0)
Monocytes Absolute: 0.5 10*3/uL (ref 0.1–1.0)
Monocytes Relative: 7.2 % (ref 3.0–12.0)
Neutro Abs: 3.8 10*3/uL (ref 1.4–7.7)
Neutrophils Relative %: 52.2 % (ref 43.0–77.0)
Platelets: 232 10*3/uL (ref 150.0–400.0)
RBC: 4.95 Mil/uL (ref 4.22–5.81)
RDW: 13.3 % (ref 11.5–15.5)
WBC: 7.3 10*3/uL (ref 4.0–10.5)

## 2021-05-24 LAB — COMPREHENSIVE METABOLIC PANEL
ALT: 20 U/L (ref 0–53)
AST: 16 U/L (ref 0–37)
Albumin: 4.4 g/dL (ref 3.5–5.2)
Alkaline Phosphatase: 43 U/L (ref 39–117)
BUN: 19 mg/dL (ref 6–23)
CO2: 27 mEq/L (ref 19–32)
Calcium: 9.6 mg/dL (ref 8.4–10.5)
Chloride: 101 mEq/L (ref 96–112)
Creatinine, Ser: 1.19 mg/dL (ref 0.40–1.50)
GFR: 64.29 mL/min (ref >60.00)
Glucose, Bld: 128 mg/dL — ABNORMAL HIGH (ref 70–99)
Potassium: 4.5 mEq/L (ref 3.5–5.1)
Sodium: 138 mEq/L (ref 135–145)
Total Bilirubin: 0.8 mg/dL (ref 0.2–1.2)
Total Protein: 7 g/dL (ref 6.0–8.3)

## 2021-05-24 LAB — HEMOGLOBIN A1C: Hgb A1c MFr Bld: 7.6 % — ABNORMAL HIGH (ref 4.6–6.5)

## 2021-05-24 LAB — LDL CHOLESTEROL, DIRECT: Direct LDL: 61 mg/dL

## 2021-05-24 LAB — VITAMIN B12: Vitamin B-12: 155 pg/mL — ABNORMAL LOW (ref 211–911)

## 2021-05-24 LAB — PSA: PSA: 1.45 ng/mL (ref 0.10–4.00)

## 2021-05-24 LAB — HM DIABETES EYE EXAM

## 2021-05-24 NOTE — Patient Instructions (Addendum)
Health Maintenance Due  Topic Date Due   Zoster Vaccines- Shingrix (2 of 2) Wants this done today if possible.  Final Shingrix today.  Team please talk with Tammy to make sure this gets submitted under his [facility]. 01/20/2020   COVID-19 Vaccine (3 - Booster for [employer] series) Patient will call us back with the dates or send a mychart message.   I suggest waiting for the new strand specific COVID-19 vaccination. 06/06/2020   PNA vac Low Risk Adult (1 of 2 - PCV13) Discussed - Patient defers for now. 04/18/2021   Please stop by lab before you go If you have mychart- we will send your results within 3 business days of [place] receiving them.  If you do not have mychart- we will call you about results within 5 business days of [place] receiving them.  *please also note that you will see labs on mychart as soon as they post. I will later go in and write notes on them- will say "notes from Dr. Durene Cal"  You could try the "MyFitnessPal" app to help track your caloric intake in regards to your diet. Goal could be to lose 5-6 lbs over a course of 12 months.  Recommended follow up: Return in about 6 months (around 11/22/2021) for follow up- or sooner if needed.  As long as a1c stable or better than last visit

## 2021-05-24 NOTE — Addendum Note (Signed)
Addended by: Daryll Brod on: 05/24/2021 02:34 PM   Modules accepted: Orders

## 2021-05-24 NOTE — Progress Notes (Signed)
Phone: [phone]   Subjective:  Patient presents today for their annual physical. Chief complaint-noted.   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 10/09/2017    Priority: High   Type 2 diabetes mellitus without complication, without long-term current use of insulin ([place]) 05/30/2017    Priority: High   Hypertension, essential 10/09/2017    Priority: Medium   Hyperlipidemia associated with type 2 diabetes mellitus ([place]) 10/09/2017    Priority: Medium   Glaucoma     Priority: Medium   Status post carpal tunnel release 10/24/2018    Priority: Low   Carpal tunnel syndrome 07/09/2018    Priority: Low   Degenerative arthritis of right knee 01/16/2018    Priority: Low   Family history of colon cancer 10/09/2017    Priority: Low   Past Surgical History:  Procedure Laterality Date   CARPAL TUNNEL RELEASE Left 10/10/2018   Procedure: LEFT CARPAL TUNNEL RELEASE;  Surgeon: Mcarthur Rossetti, MD;  Location: [facility];  Service: Orthopedics;  Laterality: Left;   COLONOSCOPY  2010   KNEE ARTHROSCOPY W/ ACL RECONSTRUCTION Bilateral    both knees x1   MANDIBLE FRACTURE SURGERY Bilateral    TONSILECTOMY/ADENOIDECTOMY WITH MYRINGOTOMY     childhood    Family History  Problem Relation Age of Onset   Myasthenia gravis Mother        complications from this   Colon cancer Father        diagnosed 27- died within a year of diagnosis    Diabetes Brother    Hyperlipidemia Brother     Medications- reviewed and updated Current Outpatient Medications  Medication Sig Dispense Refill   Accu-Chek FastClix Lancets MISC USE TO TEST BLOOD SUGAR ONCE DAILY AS DIRECTED 102 each 4   aspirin EC 81 MG tablet Take 1 tablet by mouth daily.     benazepril (LOTENSIN) 40 MG tablet TAKE 1 TABLET BY MOUTH ONCE DAILY 90 tablet 2   blood glucose meter kit and supplies Dispense based on patient and insurance preference. Use daily as directed to test blood sugar (E11.9). 1 each 0   brimonidine (ALPHAGAN) 0.2 % ophthalmic solution Instill 1 drop into affected eye(s) twice daily as directed 15 mL 0   brimonidine-timolol (COMBIGAN) 0.2-0.5 % ophthalmic solution Put 1 drop in each eye twice daily 5 mL 1   dorzolamide-timolol (COSOPT) 22.3-6.8 MG/ML ophthalmic solution Instill 1 drop into affected eye(s) 2 times daily as directed 15 mL 0   empagliflozin (JARDIANCE) 10 MG TABS tablet TAKE 1 TABLET BY MOUTH ONCE DAILY BEFORE BREAKFAST 30 tablet 5   glimepiride (AMARYL) 4 MG tablet TAKE 1 TABLET BY MOUTH ONCE DAILY WITH BREAKFAST 90 tablet 3   glucose blood test strip USE TO CHECK BLOOD GLUCOSE DAILY. 100 each 0   glucose blood test strip Once daily dx e11.9 100 each 3   metFORMIN (GLUCOPHAGE-XR) 500 MG 24 hr tablet TAKE 2 TABLETS BY  MOUTH TWICE DAILY 360 tablet 3   rosuvastatin (CRESTOR) 20 MG tablet TAKE 1 TABLET BY MOUTH ONCE DAILY 90 tablet 3   No current facility-administered medications for this visit.    Allergies-reviewed and updated Allergies  Allergen Reactions   Diphtheria Toxoid Swelling    Social History   Social History Narrative   Married. 3 kids. 5 grandkids       Home remodeling. Wife works for [employer] for at least 2 more years in 2021. [employer]- geography major       Hobbies: time with family, beach, football (season tickets to [employer])   Objective  Objective:  BP 124/68   Pulse 96   Temp 98 F (36.7 C) (Temporal)   Ht 5' 8"   (1.727 m)   Wt 182 lb (82.6 kg)   SpO2 97%   BMI 27.67 kg/m  Gen: NAD, resting comfortably HEENT: Mucous membranes are moist. Oropharynx normal Neck: no thyromegaly CV: RRR no murmurs rubs or gallops Lungs: CTAB no crackles, wheeze, rhonchi Abdomen: soft/nontender/nondistended/normal bowel sounds. No rebound or guarding.  Ext: no edema Skin: warm, dry Neuro: grossly normal, moves all extremities, PERRLA    Diabetic Foot Exam - Simple   Simple Foot Form Diabetic Foot exam was performed with the following findings: Yes 05/24/2021  1:15 PM  Visual Inspection No deformities, no ulcerations, no other skin breakdown bilaterally: Yes Sensation Testing Intact to touch and monofilament testing bilaterally: Yes Pulse Check Posterior Tibialis and Dorsalis pulse intact bilaterally: Yes Comments       Assessment and Plan  65 y.o. male presenting for annual physical.  Health Maintenance counseling: 1. Anticipatory guidance: Patient counseled regarding regular dental exams -q6 months, eye exams -yearly with history of glaucoma,  avoiding smoking and second hand smoke , limiting alcohol to 2 beverages per day -right at 14.   2. Risk factor reduction:  Advised patient of need for regular exercise and diet rich and fruits and vegetables to reduce risk of heart attack and stroke. Exercise- 3-4 times a week for 30 minutes or more. Diet-discussed working towards 5-10 lbs off over 12 months months.  Wt Readings from Last 3 Encounters:  05/24/21 182 lb (82.6 kg)  01/25/21 179 lb 3.7 oz (81.3 kg)  05/20/20 179 lb 3.2 oz (81.3 kg)  3. Immunizations/screenings/ancillary studies- discussed Shingrix-will do final today as still has [place] insurance, 3rd COVID-19 booster-will call with dates, and Flu vaccination-declines, Prevnar 20-defers for now - otherwise up-to-date. Immunization History  Administered Date(s) Administered   PFIZER(Purple Top)SARS-COV-2 Vaccination 12/11/2019, 01/05/2020    Pneumococcal Polysaccharide-23 10/09/2017   Zoster Recombinat (Shingrix) 11/25/2019  4. Prostate cancer screening- Will get PSA to trend the #s- prior low risk PSA trend Lab Results  Component Value Date   PSA 1.48 11/25/2019   PSA 1.55 08/14/2018   5. Colon cancer screening - Last 01/09/2018 and results were normal - a 5-year repeat is planned. Family history of colon cancer in father in lat 26's. 6. Skin cancer screening- no dermatologist. advised regular sunscreen use. Denies worrisome, changing, or new skin lesions.  Last year patient had two small areas on right arm- stable today. Prior callous on left hand resolved.  7. Never smoker 8. STD screening - Patient opts out as monogamous   Status of chronic or acute concerns   # Diabetes-peak A1c of 8.26 November 2019 S: Medication: jardiance 10 mg, metformin 1060m BID XR, glimepride 4 mg daily CBGs- rarely checks Exercise and diet- see above Lab Results  Component Value Date   HGBA1C 7.1 (H) 05/26/2020   HGBA1C 8.3 (H) 11/25/2019   HGBA1C 6.6 (A) 01/08/2019  A/P:  hopefully stable- update a1c today. Continue current meds for now A1c -We have not previously checked B12 and with long-term metformin use we will check this today  #hyperlipidemia S: Medication:rosuvastatin 58m daily, aspirin 899mdaily- discussed could stop Lab Results  Component Value Date   CHOL 209 (H) 11/25/2019   HDL 66.30 11/25/2019   LDLCALC 113 (H) 11/25/2019   LDLDIRECT 56 05/26/2020   TRIG 151.0 (H) 11/25/2019   CHOLHDL 3 11/25/2019   A/P: reasonably control last check-update with labs today  #hypertension S: medication: benazepril 4015maily BP Readings from Last 3 Encounters:  05/24/21 124/68  05/20/20 138/78  11/25/19 138/88  A/P:   Stable. Continue current medications.  High normal in office but reports it is better at home  Recommended follow up: No follow-ups on file.  Lab/Order associations:NOT fasting   ICD-10-CM   1. Preventative health  care  Z00.00 CBC with Differential/Platelet    Comprehensive metabolic panel    Lipid panel    Hemoglobin A1c    Vitamin B12    PSA    2. Type 2 diabetes mellitus without complication, without long-term current use of insulin (HCC)  E11.9 CBC with Differential/Platelet    Comprehensive metabolic panel    Lipid panel    Hemoglobin A1c    3. Hypertension, essential  I10 CBC with Differential/Platelet    Comprehensive metabolic panel    Lipid panel    4. Hyperlipidemia associated with type 2 diabetes mellitus (HCC)  E11.69 CBC with Differential/Platelet   E78.5 Comprehensive metabolic panel    Lipid panel    5. Screening for prostate cancer  Z12.5 PSA    6. High risk medication use  Z79.899 Vitamin B12     No orders of the defined types were placed in this encounter.  I,Harris Phan,acting as a scrEducation administratorr SteGarret ReddishD.,have documented all relevant documentation on the behalf of SteGarret ReddishD,as directed by  SteGarret ReddishD while in the presence of SteGarret ReddishD.  I, SteGarret ReddishD, have reviewed all documentation for this visit. The documentation on 05/24/21 for the exam, diagnosis, procedures, and orders are all accurate and complete.   Return precautions advised.  SteGarret ReddishD

## 2021-05-25 ENCOUNTER — Other Ambulatory Visit: Payer: Self-pay

## 2021-05-25 ENCOUNTER — Other Ambulatory Visit (HOSPITAL_COMMUNITY): Payer: Self-pay

## 2021-05-25 MED ORDER — EMPAGLIFLOZIN 25 MG PO TABS
25.0000 mg | ORAL_TABLET | Freq: Every day | ORAL | 11 refills | Status: DC
  Filled 2021-05-25: qty 30, 30d supply, fill #0
  Filled 2021-06-29: qty 30, 30d supply, fill #1
  Filled 2021-06-29: qty 30, 30d supply, fill #0
  Filled 2021-07-29: qty 30, 30d supply, fill #1
  Filled 2021-08-31: qty 30, 30d supply, fill #2
  Filled 2021-09-29: qty 30, 30d supply, fill #3
  Filled 2021-10-31: qty 30, 30d supply, fill #4
  Filled 2021-12-02: qty 30, 30d supply, fill #5
  Filled 2022-01-03: qty 30, 30d supply, fill #6
  Filled 2022-02-01: qty 30, 30d supply, fill #7

## 2021-06-13 ENCOUNTER — Other Ambulatory Visit (HOSPITAL_BASED_OUTPATIENT_CLINIC_OR_DEPARTMENT_OTHER): Payer: Self-pay

## 2021-06-13 ENCOUNTER — Other Ambulatory Visit (HOSPITAL_COMMUNITY): Payer: Self-pay

## 2021-06-13 MED ORDER — DORZOLAMIDE HCL-TIMOLOL MAL 2-0.5 % OP SOLN
OPHTHALMIC | 0 refills | Status: DC
  Filled 2021-06-13: qty 10, 50d supply, fill #0
  Filled 2021-07-18: qty 5, 25d supply, fill #1

## 2021-06-13 MED FILL — Rosuvastatin Calcium Tab 20 MG: ORAL | 90 days supply | Qty: 90 | Fill #1 | Status: AC

## 2021-06-29 ENCOUNTER — Other Ambulatory Visit (HOSPITAL_COMMUNITY): Payer: Self-pay

## 2021-06-29 ENCOUNTER — Other Ambulatory Visit (HOSPITAL_BASED_OUTPATIENT_CLINIC_OR_DEPARTMENT_OTHER): Payer: Self-pay

## 2021-07-15 ENCOUNTER — Other Ambulatory Visit (HOSPITAL_COMMUNITY): Payer: Self-pay

## 2021-07-18 ENCOUNTER — Other Ambulatory Visit (HOSPITAL_BASED_OUTPATIENT_CLINIC_OR_DEPARTMENT_OTHER): Payer: Self-pay

## 2021-07-18 ENCOUNTER — Other Ambulatory Visit (HOSPITAL_COMMUNITY): Payer: Self-pay

## 2021-07-18 MED ORDER — BRIMONIDINE TARTRATE 0.2 % OP SOLN
OPHTHALMIC | 0 refills | Status: DC
  Filled 2021-07-18: qty 15, 75d supply, fill #0

## 2021-07-18 MED ORDER — DORZOLAMIDE HCL-TIMOLOL MAL 2-0.5 % OP SOLN
OPHTHALMIC | 0 refills | Status: DC
  Filled 2021-07-18: qty 10, 50d supply, fill #0

## 2021-07-19 ENCOUNTER — Other Ambulatory Visit (HOSPITAL_COMMUNITY): Payer: Self-pay

## 2021-07-21 ENCOUNTER — Other Ambulatory Visit (HOSPITAL_COMMUNITY): Payer: Self-pay

## 2021-07-29 ENCOUNTER — Other Ambulatory Visit: Payer: Self-pay | Admitting: Family Medicine

## 2021-07-29 ENCOUNTER — Other Ambulatory Visit (HOSPITAL_BASED_OUTPATIENT_CLINIC_OR_DEPARTMENT_OTHER): Payer: Self-pay

## 2021-07-29 DIAGNOSIS — I10 Essential (primary) hypertension: Secondary | ICD-10-CM

## 2021-07-29 DIAGNOSIS — E119 Type 2 diabetes mellitus without complications: Secondary | ICD-10-CM

## 2021-08-01 ENCOUNTER — Other Ambulatory Visit (HOSPITAL_BASED_OUTPATIENT_CLINIC_OR_DEPARTMENT_OTHER): Payer: Self-pay

## 2021-08-01 MED ORDER — GLIMEPIRIDE 4 MG PO TABS
ORAL_TABLET | Freq: Every day | ORAL | 3 refills | Status: DC
  Filled 2021-08-01: qty 90, 90d supply, fill #0
  Filled 2021-11-22: qty 90, 90d supply, fill #1
  Filled 2022-02-01: qty 90, 90d supply, fill #2
  Filled 2022-05-05: qty 90, 90d supply, fill #3

## 2021-08-01 MED ORDER — METFORMIN HCL ER 500 MG PO TB24
ORAL_TABLET | Freq: Two times a day (BID) | ORAL | 3 refills | Status: DC
  Filled 2021-08-01: qty 360, 90d supply, fill #0
  Filled 2021-10-24: qty 360, 90d supply, fill #1
  Filled 2022-01-16: qty 360, 90d supply, fill #2
  Filled 2022-04-05: qty 360, 90d supply, fill #3

## 2021-08-01 MED ORDER — BENAZEPRIL HCL 40 MG PO TABS
ORAL_TABLET | Freq: Every day | ORAL | 2 refills | Status: DC
  Filled 2021-08-01: qty 90, 90d supply, fill #0
  Filled 2021-10-31: qty 90, 90d supply, fill #1
  Filled 2022-02-01: qty 90, 90d supply, fill #2

## 2021-08-02 ENCOUNTER — Other Ambulatory Visit (HOSPITAL_BASED_OUTPATIENT_CLINIC_OR_DEPARTMENT_OTHER): Payer: Self-pay

## 2021-08-03 ENCOUNTER — Other Ambulatory Visit (HOSPITAL_BASED_OUTPATIENT_CLINIC_OR_DEPARTMENT_OTHER): Payer: Self-pay

## 2021-08-31 ENCOUNTER — Other Ambulatory Visit (HOSPITAL_BASED_OUTPATIENT_CLINIC_OR_DEPARTMENT_OTHER): Payer: Self-pay

## 2021-08-31 MED ORDER — DORZOLAMIDE HCL-TIMOLOL MAL 2-0.5 % OP SOLN
OPHTHALMIC | 0 refills | Status: DC
  Filled 2021-08-31: qty 10, 50d supply, fill #0

## 2021-09-13 ENCOUNTER — Other Ambulatory Visit (HOSPITAL_BASED_OUTPATIENT_CLINIC_OR_DEPARTMENT_OTHER): Payer: Self-pay

## 2021-09-13 MED FILL — Rosuvastatin Calcium Tab 20 MG: ORAL | 90 days supply | Qty: 90 | Fill #0 | Status: AC

## 2021-09-29 ENCOUNTER — Other Ambulatory Visit (HOSPITAL_BASED_OUTPATIENT_CLINIC_OR_DEPARTMENT_OTHER): Payer: Self-pay

## 2021-10-10 ENCOUNTER — Other Ambulatory Visit (HOSPITAL_COMMUNITY): Payer: Self-pay

## 2021-10-24 ENCOUNTER — Encounter (HOSPITAL_BASED_OUTPATIENT_CLINIC_OR_DEPARTMENT_OTHER): Payer: Self-pay

## 2021-10-24 ENCOUNTER — Other Ambulatory Visit (HOSPITAL_BASED_OUTPATIENT_CLINIC_OR_DEPARTMENT_OTHER): Payer: Self-pay

## 2021-10-24 DIAGNOSIS — H401132 Primary open-angle glaucoma, bilateral, moderate stage: Secondary | ICD-10-CM | POA: Diagnosis not present

## 2021-10-24 MED ORDER — BRIMONIDINE TARTRATE 0.2 % OP SOLN
OPHTHALMIC | 1 refills | Status: DC
  Filled 2021-10-24: qty 15, 75d supply, fill #0
  Filled 2022-01-16: qty 15, 75d supply, fill #1

## 2021-10-24 MED ORDER — DORZOLAMIDE HCL-TIMOLOL MAL 2-0.5 % OP SOLN
OPHTHALMIC | 1 refills | Status: DC
  Filled 2021-10-24: qty 10, 50d supply, fill #0
  Filled 2021-12-16: qty 10, 50d supply, fill #1
  Filled 2022-01-16: qty 10, 50d supply, fill #2

## 2021-10-26 ENCOUNTER — Other Ambulatory Visit (HOSPITAL_BASED_OUTPATIENT_CLINIC_OR_DEPARTMENT_OTHER): Payer: Self-pay

## 2021-10-31 ENCOUNTER — Other Ambulatory Visit (HOSPITAL_BASED_OUTPATIENT_CLINIC_OR_DEPARTMENT_OTHER): Payer: Self-pay

## 2021-11-21 NOTE — Progress Notes (Signed)
? ?Phone [phone] ?In person visit ?  ?Subjective:  ? ?Chad Morton is a 66 y.o. year old very pleasant male patient who presents for/with See problem oriented charting ?Chief Complaint  ?Patient presents with  ? Diabetes  ? Hyperlipidemia  ? Hypertension  ? ? ?This visit occurred during the SARS-CoV-2 public health emergency.  Safety protocols were in place, including screening questions prior to the visit, additional usage of staff PPE, and extensive cleaning of exam room while observing appropriate contact time as indicated for disinfecting solutions.  ? ?Past Medical History-  ?Patient Active Problem List  ? Diagnosis Date Noted  ? Diphtheria vaccine adverse reaction 10/09/2017  ?  Priority: High  ? Type 2 diabetes mellitus without complication, without long-term current use of insulin ([place]) 05/30/2017  ?  Priority: High  ? B12 deficiency 11/25/2021  ?  Priority: Medium   ? Hypertension, essential 10/09/2017  ?  Priority: Medium   ? Hyperlipidemia associated with type 2 diabetes mellitus ([place]) 10/09/2017  ?  Priority: Medium   ? Glaucoma   ?  Priority: Medium   ? Status post carpal tunnel release 10/24/2018  ?  Priority: Low  ? Carpal tunnel syndrome 07/09/2018  ?  Priority: Low  ? Degenerative arthritis of right knee 01/16/2018  ?  Priority: Low  ? Family history of colon cancer 10/09/2017  ?  Priority: Low  ? ? ?Medications- reviewed and updated ?Current Outpatient Medications  ?Medication Sig Dispense Refill  ? Accu-Chek FastClix Lancets MISC USE TO TEST BLOOD SUGAR ONCE DAILY AS DIRECTED 102 each 4  ? aspirin EC 81 MG tablet Take 1 tablet by mouth daily.    ? benazepril (LOTENSIN) 40 MG tablet TAKE 1 TABLET BY MOUTH ONCE DAILY 90 tablet 2  ? blood glucose meter kit and supplies Dispense based on patient and insurance preference. Use daily as directed to test blood sugar (E11.9). 1 each 0  ? brimonidine (ALPHAGAN) 0.2 % ophthalmic solution Instill 1 drop into affected eye(s) twice daily as directed 15  mL 1  ? dorzolamide-timolol (COSOPT) 22.3-6.8 MG/ML ophthalmic solution Instill 1 drop into affected eye(s) 2 times daily as directed 15 mL 1  ? empagliflozin (JARDIANCE) 25 MG TABS tablet Take 1 tablet (25 mg total) by mouth daily before breakfast. 30 tablet 11  ? glimepiride (AMARYL) 4 MG tablet TAKE 1 TABLET BY MOUTH ONCE DAILY WITH BREAKFAST 90 tablet 3  ? glucose blood test strip USE TO CHECK BLOOD GLUCOSE DAILY. 100 each 0  ? glucose blood test strip Once daily dx e11.9 100 each 3  ? metFORMIN (GLUCOPHAGE-XR) 500 MG 24 hr tablet TAKE 2 TABLETS BY MOUTH TWICE DAILY 360 tablet 3  ? rosuvastatin (CRESTOR) 20 MG tablet TAKE 1 TABLET BY MOUTH ONCE DAILY 90 tablet 3  ? ?No current facility-administered medications for this visit.  ? ?  ?Objective:  ?BP 118/76   Pulse 94   Temp 98.1 ?F (36.7 ?C)   Ht 5' 8"  (1.727 m)   Wt 179 lb 12.8 oz (81.6 kg)   SpO2 98%   BMI 27.34 kg/m?  ?Gen: NAD, resting comfortably ?CV: RRR no murmurs rubs or gallops ?Lungs: CTAB no crackles, wheeze, rhonchi ?Ext: no edema ?Skin: warm, dry ?  ? ?Assessment and Plan  ? ?# Diabetes-peak A1c of 8.26 November 2019 ?S: Medication: jardiance 25 mg up from 75m, metformin 1000 mg  XR BID, glimepride 4 mg ?CBGs- not checking ?Exercise and diet- active with work but no  exercise outside of work other than walking dog.  ?Lab Results  ?Component Value Date  ? HGBA1C 7.6 (H) 05/24/2021  ? HGBA1C 7.1 (H) 05/26/2020  ? HGBA1C 8.3 (H) 11/25/2019  ? A/P: hopefully improved- update a1c - if poorly controlled consider rybelsus or ozempic- prefer under 7 ?- may consider mail order jardiance from [place]- he may want printed copy ? ?#hyperlipidemia ?S: Medication:rosuvastatin 63m, aspirin 872mper his preference- intermittent twice a week ?Lab Results  ?Component Value Date  ? CHOL 145 05/24/2021  ? HDL 58.20 05/24/2021  ? LDLCALC 113 (H) 11/25/2019  ? LDLDIRECT 61.0 05/24/2021  ? TRIG 223.0 (H) 05/24/2021  ? CHOLHDL 2 05/24/2021  ? A/P: good control last check-  continue current meds ? ?#hypertension ?S: medication: benazepril 4099mHome readings #s: not checking ?BP Readings from Last 3 Encounters:  ?11/25/21 118/76  ?05/24/21 124/68  ?05/20/20 138/78  ?A/P: Controlled. Continue current medications.  ? ?# B12 deficiency-deficiency noted potentially related to metformin ?S: Current treatment/medication (oral vs. IM):  1000 mcg daily  ?Lab Results  ?Component Value Date  ? VITAMINB12 155 (L) 05/24/2021  ?A/P: hopefully improved- update b12 with labs today - for now continue oral supplement  ? ?#Glaucoma-continue to follow closely with [facility]- but sees every 6 months- brimonidine plus dorzolamide- timolol ? ?#HM-Prevnar 20 - opted out for now  ? ?#import [place] exposure- irritant that he was exposed to- has affected his sense of smell that comes and goes. Would advise kn95 or different material if possible ? ?Recommended follow up: Return for next already scheduled visit or sooner if needed. ?Future Appointments  ?Date Time Provider DepLesslie8/31/2023  1:00 PM Elizjah Noblet, SteBrayton MarsD [facility]  ? ?Lab/Order associations: ?  ICD-10-CM   ?1. Hyperlipidemia associated with type 2 diabetes mellitus (HCCNewellE11.69   ? E78.5   ?  ?2. Hypertension, essential  I10   ?  ?3. Type 2 diabetes mellitus without complication, without long-term current use of insulin ([facility])  E11.9 CBC with Differential/Platelet  ?  Comprehensive metabolic panel  ?  Hemoglobin A1c  ?  ?4. B12 deficiency  E53.8 Vitamin B12  ?  ? ?No orders of the defined types were placed in this encounter. ? ?I,Jada Bradford,acting as a scribe for SteGarret ReddishD.,have documented all relevant documentation on the behalf of SteGarret ReddishD,as directed by  SteGarret ReddishD while in the presence of SteGarret ReddishD. ? ?I, SteGarret ReddishD, have reviewed all documentation for this visit. The documentation on 11/25/21 for the exam, diagnosis, procedures, and orders are all accurate and  complete. ? ? ?Return precautions advised.  ?SteGarret ReddishD ? ? ?

## 2021-11-22 ENCOUNTER — Other Ambulatory Visit (HOSPITAL_BASED_OUTPATIENT_CLINIC_OR_DEPARTMENT_OTHER): Payer: Self-pay

## 2021-11-22 ENCOUNTER — Ambulatory Visit: Payer: PPO | Admitting: Family Medicine

## 2021-11-25 ENCOUNTER — Encounter: Payer: Self-pay | Admitting: Family Medicine

## 2021-11-25 ENCOUNTER — Other Ambulatory Visit: Payer: Self-pay

## 2021-11-25 ENCOUNTER — Ambulatory Visit (INDEPENDENT_AMBULATORY_CARE_PROVIDER_SITE_OTHER): Payer: PPO | Admitting: Family Medicine

## 2021-11-25 VITALS — BP 118/76 | HR 94 | Temp 98.1°F | Ht 68.0 in | Wt 179.8 lb

## 2021-11-25 DIAGNOSIS — E538 Deficiency of other specified B group vitamins: Secondary | ICD-10-CM | POA: Diagnosis not present

## 2021-11-25 DIAGNOSIS — E1169 Type 2 diabetes mellitus with other specified complication: Secondary | ICD-10-CM | POA: Diagnosis not present

## 2021-11-25 DIAGNOSIS — E785 Hyperlipidemia, unspecified: Secondary | ICD-10-CM | POA: Diagnosis not present

## 2021-11-25 DIAGNOSIS — I10 Essential (primary) hypertension: Secondary | ICD-10-CM | POA: Diagnosis not present

## 2021-11-25 DIAGNOSIS — E119 Type 2 diabetes mellitus without complications: Secondary | ICD-10-CM | POA: Diagnosis not present

## 2021-11-25 LAB — HEMOGLOBIN A1C: Hgb A1c MFr Bld: 7.6 % — ABNORMAL HIGH (ref 4.6–6.5)

## 2021-11-25 LAB — CBC WITH DIFFERENTIAL/PLATELET
Basophils Absolute: 0.1 10*3/uL (ref 0.0–0.1)
Basophils Relative: 0.9 % (ref 0.0–3.0)
Eosinophils Absolute: 1.2 10*3/uL — ABNORMAL HIGH (ref 0.0–0.7)
Eosinophils Relative: 15.8 % — ABNORMAL HIGH (ref 0.0–5.0)
HCT: 43.8 % (ref 39.0–52.0)
Hemoglobin: 15.1 g/dL (ref 13.0–17.0)
Lymphocytes Relative: 30.7 % (ref 12.0–46.0)
Lymphs Abs: 2.3 10*3/uL (ref 0.7–4.0)
MCHC: 34.5 g/dL (ref 30.0–36.0)
MCV: 92.2 fl (ref 78.0–100.0)
Monocytes Absolute: 0.5 10*3/uL (ref 0.1–1.0)
Monocytes Relative: 7.1 % (ref 3.0–12.0)
Neutro Abs: 3.4 10*3/uL (ref 1.4–7.7)
Neutrophils Relative %: 45.5 % (ref 43.0–77.0)
Platelets: 237 10*3/uL (ref 150.0–400.0)
RBC: 4.75 Mil/uL (ref 4.22–5.81)
RDW: 13 % (ref 11.5–15.5)
WBC: 7.5 10*3/uL (ref 4.0–10.5)

## 2021-11-25 LAB — COMPREHENSIVE METABOLIC PANEL
ALT: 14 U/L (ref 0–53)
AST: 14 U/L (ref 0–37)
Albumin: 4.5 g/dL (ref 3.5–5.2)
Alkaline Phosphatase: 38 U/L — ABNORMAL LOW (ref 39–117)
BUN: 21 mg/dL (ref 6–23)
CO2: 25 mEq/L (ref 19–32)
Calcium: 9.4 mg/dL (ref 8.4–10.5)
Chloride: 102 mEq/L (ref 96–112)
Creatinine, Ser: 0.97 mg/dL (ref 0.40–1.50)
GFR: 81.87 mL/min (ref >60.00)
Glucose, Bld: 123 mg/dL — ABNORMAL HIGH (ref 70–99)
Potassium: 4.4 mEq/L (ref 3.5–5.1)
Sodium: 136 mEq/L (ref 135–145)
Total Bilirubin: 1 mg/dL (ref 0.2–1.2)
Total Protein: 7 g/dL (ref 6.0–8.3)

## 2021-11-25 LAB — VITAMIN B12: Vitamin B-12: 590 pg/mL (ref 211–911)

## 2021-11-25 NOTE — Patient Instructions (Addendum)
Prevnar 20 in future ? ?Please stop by lab before you go ?If you have mychart- we will send your results within 3 business days of [place] receiving them.  ?If you do not have mychart- we will call you about results within 5 business days of [place] receiving them.  ?*please also note that you will see labs on mychart as soon as they post. I will later go in and write notes on them- will say "notes from Dr. Durene Cal"  ? ?Recommended follow up: Return for next already scheduled visit or sooner if needed. August physical ?

## 2021-12-01 ENCOUNTER — Encounter: Payer: Self-pay | Admitting: Family Medicine

## 2021-12-02 ENCOUNTER — Other Ambulatory Visit (HOSPITAL_BASED_OUTPATIENT_CLINIC_OR_DEPARTMENT_OTHER): Payer: Self-pay

## 2021-12-02 ENCOUNTER — Encounter: Payer: Self-pay | Admitting: Family Medicine

## 2021-12-02 MED ORDER — OZEMPIC (0.25 OR 0.5 MG/DOSE) 2 MG/1.5ML ~~LOC~~ SOPN: PEN_INJECTOR | SUBCUTANEOUS | 0 refills | Status: DC

## 2021-12-02 MED ORDER — OZEMPIC (1 MG/DOSE) 2 MG/1.5ML ~~LOC~~ SOPN: 1.0000 mg | PEN_INJECTOR | SUBCUTANEOUS | 5 refills | Status: DC

## 2021-12-02 NOTE — Telephone Encounter (Signed)
He can find the injection instructions on the Ozempic website ? ?Start with 0.25 mg for a month ? ?After a month go to 0.5 mg-should be with the same pen ? ?After another month increase to 1 mg-stay on that dose until next visit-recommend around 4 months after starting medication ?

## 2021-12-16 ENCOUNTER — Other Ambulatory Visit (HOSPITAL_BASED_OUTPATIENT_CLINIC_OR_DEPARTMENT_OTHER): Payer: Self-pay

## 2021-12-16 ENCOUNTER — Other Ambulatory Visit: Payer: Self-pay | Admitting: Family Medicine

## 2021-12-16 MED ORDER — ROSUVASTATIN CALCIUM 20 MG PO TABS
ORAL_TABLET | Freq: Every day | ORAL | 3 refills | Status: DC
  Filled 2021-12-16: qty 90, 90d supply, fill #0
  Filled 2022-03-13: qty 90, 90d supply, fill #1
  Filled 2022-06-06: qty 90, 90d supply, fill #2
  Filled 2022-09-11: qty 90, 90d supply, fill #3

## 2022-01-03 ENCOUNTER — Other Ambulatory Visit (HOSPITAL_BASED_OUTPATIENT_CLINIC_OR_DEPARTMENT_OTHER): Payer: Self-pay

## 2022-01-13 ENCOUNTER — Encounter: Payer: Self-pay | Admitting: Family Medicine

## 2022-01-14 ENCOUNTER — Other Ambulatory Visit: Payer: Self-pay | Admitting: Family Medicine

## 2022-01-16 ENCOUNTER — Other Ambulatory Visit: Payer: Self-pay | Admitting: *Deleted

## 2022-01-16 ENCOUNTER — Other Ambulatory Visit (HOSPITAL_BASED_OUTPATIENT_CLINIC_OR_DEPARTMENT_OTHER): Payer: Self-pay

## 2022-01-16 ENCOUNTER — Encounter (HOSPITAL_BASED_OUTPATIENT_CLINIC_OR_DEPARTMENT_OTHER): Payer: Self-pay | Admitting: Pharmacist

## 2022-01-16 ENCOUNTER — Encounter (HOSPITAL_BASED_OUTPATIENT_CLINIC_OR_DEPARTMENT_OTHER): Payer: Self-pay

## 2022-01-16 ENCOUNTER — Other Ambulatory Visit: Payer: Self-pay

## 2022-01-16 MED ORDER — LINAGLIPTIN 5 MG PO TABS: 5.0000 mg | ORAL_TABLET | Freq: Every day | ORAL | 3 refills | Status: DC

## 2022-01-16 MED ORDER — EMPAGLIFLOZIN 25 MG PO TABS: 25.0000 mg | ORAL_TABLET | Freq: Every day | ORAL | 3 refills | Status: DC

## 2022-01-16 NOTE — Progress Notes (Signed)
Rx printed

## 2022-01-16 NOTE — Progress Notes (Signed)
Rxs printed.  

## 2022-01-17 ENCOUNTER — Other Ambulatory Visit (HOSPITAL_BASED_OUTPATIENT_CLINIC_OR_DEPARTMENT_OTHER): Payer: Self-pay

## 2022-02-01 ENCOUNTER — Other Ambulatory Visit (HOSPITAL_BASED_OUTPATIENT_CLINIC_OR_DEPARTMENT_OTHER): Payer: Self-pay

## 2022-02-16 ENCOUNTER — Other Ambulatory Visit (HOSPITAL_BASED_OUTPATIENT_CLINIC_OR_DEPARTMENT_OTHER): Payer: Self-pay

## 2022-03-13 ENCOUNTER — Other Ambulatory Visit (HOSPITAL_BASED_OUTPATIENT_CLINIC_OR_DEPARTMENT_OTHER): Payer: Self-pay

## 2022-03-13 MED ORDER — DORZOLAMIDE HCL-TIMOLOL MAL 2-0.5 % OP SOLN
OPHTHALMIC | 1 refills | Status: DC
  Filled 2022-03-13: qty 10, 50d supply, fill #0
  Filled 2022-05-05: qty 10, 50d supply, fill #1
  Filled 2022-06-06 – 2022-06-09 (×2): qty 10, 50d supply, fill #2

## 2022-04-05 ENCOUNTER — Encounter (HOSPITAL_BASED_OUTPATIENT_CLINIC_OR_DEPARTMENT_OTHER): Payer: Self-pay

## 2022-04-05 ENCOUNTER — Other Ambulatory Visit (HOSPITAL_BASED_OUTPATIENT_CLINIC_OR_DEPARTMENT_OTHER): Payer: Self-pay

## 2022-04-06 ENCOUNTER — Other Ambulatory Visit (HOSPITAL_BASED_OUTPATIENT_CLINIC_OR_DEPARTMENT_OTHER): Payer: Self-pay

## 2022-04-12 DIAGNOSIS — H401132 Primary open-angle glaucoma, bilateral, moderate stage: Secondary | ICD-10-CM | POA: Diagnosis not present

## 2022-04-12 DIAGNOSIS — E119 Type 2 diabetes mellitus without complications: Secondary | ICD-10-CM | POA: Diagnosis not present

## 2022-04-12 LAB — HM DIABETES EYE EXAM

## 2022-04-18 ENCOUNTER — Encounter: Payer: Self-pay | Admitting: Family Medicine

## 2022-05-03 ENCOUNTER — Telehealth: Payer: Self-pay | Admitting: Family Medicine

## 2022-05-03 NOTE — Telephone Encounter (Signed)
Copied from CRM [id number]. Topic: Medicare AWV >> May 03, 2022 12:50 PM Payton Doughty wrote: Reason for CRM: Left message for patient to schedule Annual Wellness Visit.  Please schedule with Nurse Health Advisor Lanier Ensign, RN at [facility]. This appt can be telephone or office visit. Please call [phone] ask for [facility]

## 2022-05-05 ENCOUNTER — Other Ambulatory Visit: Payer: Self-pay | Admitting: Family Medicine

## 2022-05-05 ENCOUNTER — Other Ambulatory Visit (HOSPITAL_BASED_OUTPATIENT_CLINIC_OR_DEPARTMENT_OTHER): Payer: Self-pay

## 2022-05-05 DIAGNOSIS — I10 Essential (primary) hypertension: Secondary | ICD-10-CM

## 2022-05-05 MED ORDER — BENAZEPRIL HCL 40 MG PO TABS
ORAL_TABLET | Freq: Every day | ORAL | 2 refills | Status: DC
  Filled 2022-05-05: qty 90, 90d supply, fill #0
  Filled 2022-08-04: qty 90, 90d supply, fill #1
  Filled 2022-10-31: qty 90, 90d supply, fill #2

## 2022-05-09 ENCOUNTER — Ambulatory Visit (INDEPENDENT_AMBULATORY_CARE_PROVIDER_SITE_OTHER): Payer: PPO

## 2022-05-09 DIAGNOSIS — Z0000 Encounter for general adult medical examination without abnormal findings: Secondary | ICD-10-CM

## 2022-05-09 NOTE — Patient Instructions (Addendum)
[name] , Thank you for taking time to come for your Medicare Wellness Visit. I appreciate your ongoing commitment to your health goals. Please review the following plan we discussed and let me know if I can assist you in the future.   Screening recommendations/referrals: Colonoscopy: done 01/09/18 repeat every 5 years  Recommended yearly ophthalmology/optometry visit for glaucoma screening and checkup Recommended yearly dental visit for hygiene and checkup  Vaccinations: Influenza vaccine: due  Pneumococcal vaccine: due Shingles vaccine: completed   11/25/19, 05/24/21 Covid-19: completed 3/18, 01/05/20  Advanced directives: Please bring a copy of your health care power of attorney and living will to the office at your convenience.  Conditions/risks identified: lose 10 lbs   Next appointment: Follow up in one year for your annual wellness visit.   Preventive Care 26 Years and Older, Male Preventive care refers to lifestyle choices and visits with your health care provider that can promote health and wellness. What does preventive care include? A yearly physical exam. This is also called an annual well check. Dental exams once or twice a year. Routine eye exams. Ask your health care provider how often you should have your eyes checked. Personal lifestyle choices, including: Daily care of your teeth and gums. Regular physical activity. Eating a healthy diet. Avoiding tobacco and drug use. Limiting alcohol use. Practicing safe sex. Taking low doses of aspirin every day. Taking vitamin and mineral supplements as recommended by your health care provider. What happens during an annual well check? The services and screenings done by your health care provider during your annual well check will depend on your age, overall health, lifestyle risk factors, and family history of disease. Counseling  Your health care provider may ask you questions about your: Alcohol use. Tobacco use. Drug  use. Emotional well-being. Home and relationship well-being. Sexual activity. Eating habits. History of falls. Memory and ability to understand (cognition). Work and work Astronomer. Screening  You may have the following tests or measurements: Height, weight, and BMI. Blood pressure. Lipid and cholesterol levels. These may be checked every 5 years, or more frequently if you are over 20 years old. Skin check. Lung cancer screening. You may have this screening every year starting at age 81 if you have a 30-pack-year history of smoking and currently smoke or have quit within the past 15 years. Fecal occult blood test (FOBT) of the stool. You may have this test every year starting at age 63. Flexible sigmoidoscopy or colonoscopy. You may have a sigmoidoscopy every 5 years or a colonoscopy every 10 years starting at age 26. Prostate cancer screening. Recommendations will vary depending on your family history and other risks. Hepatitis C blood test. Hepatitis B blood test. Sexually transmitted disease (STD) testing. Diabetes screening. This is done by checking your blood sugar (glucose) after you have not eaten for a while (fasting). You may have this done every 1-3 years. Abdominal aortic aneurysm (AAA) screening. You may need this if you are a current or former smoker. Osteoporosis. You may be screened starting at age 57 if you are at high risk. Talk with your health care provider about your test results, treatment options, and if necessary, the need for more tests. Vaccines  Your health care provider may recommend certain vaccines, such as: Influenza vaccine. This is recommended every year. Tetanus, diphtheria, and acellular pertussis (Tdap, Td) vaccine. You may need a Td booster every 10 years. Zoster vaccine. You may need this after age 73. Pneumococcal 13-valent conjugate (PCV13) vaccine.  One dose is recommended after age 67. Pneumococcal polysaccharide (PPSV23) vaccine. One dose is  recommended after age 24. Talk to your health care provider about which screenings and vaccines you need and how often you need them. This information is not intended to replace advice given to you by your health care provider. Make sure you discuss any questions you have with your health care provider. Document Released: 10/08/2015 Document Revised: 05/31/2016 Document Reviewed: 07/13/2015 Elsevier Interactive Patient Education  2017 [place] Prevention in the Home Falls can cause injuries. They can happen to people of all ages. There are many things you can do to make your home safe and to help prevent falls. What can I do on the outside of my home? Regularly fix the edges of walkways and driveways and fix any cracks. Remove anything that might make you trip as you walk through a door, such as a raised step or threshold. Trim any bushes or trees on the path to your home. Use bright outdoor lighting. Clear any walking paths of anything that might make someone trip, such as rocks or tools. Regularly check to see if handrails are loose or broken. Make sure that both sides of any steps have handrails. Any raised decks and porches should have guardrails on the edges. Have any leaves, snow, or ice cleared regularly. Use sand or salt on walking paths during winter. Clean up any spills in your garage right away. This includes oil or grease spills. What can I do in the bathroom? Use night lights. Install grab bars by the toilet and in the tub and shower. Do not use towel bars as grab bars. Use non-skid mats or decals in the tub or shower. If you need to sit down in the shower, use a plastic, non-slip stool. Keep the floor dry. Clean up any water that spills on the floor as soon as it happens. Remove soap buildup in the tub or shower regularly. Attach bath mats securely with double-sided non-slip rug tape. Do not have throw rugs and other things on the floor that can make you  trip. What can I do in the bedroom? Use night lights. Make sure that you have a light by your bed that is easy to reach. Do not use any sheets or blankets that are too big for your bed. They should not hang down onto the floor. Have a firm chair that has side arms. You can use this for support while you get dressed. Do not have throw rugs and other things on the floor that can make you trip. What can I do in the kitchen? Clean up any spills right away. Avoid walking on wet floors. Keep items that you use a lot in easy-to-reach places. If you need to reach something above you, use a strong step stool that has a grab bar. Keep electrical cords out of the way. Do not use floor polish or wax that makes floors slippery. If you must use wax, use non-skid floor wax. Do not have throw rugs and other things on the floor that can make you trip. What can I do with my stairs? Do not leave any items on the stairs. Make sure that there are handrails on both sides of the stairs and use them. Fix handrails that are broken or loose. Make sure that handrails are as long as the stairways. Check any carpeting to make sure that it is firmly attached to the stairs. Fix any carpet that is loose or  worn. Avoid having throw rugs at the top or bottom of the stairs. If you do have throw rugs, attach them to the floor with carpet tape. Make sure that you have a light switch at the top of the stairs and the bottom of the stairs. If you do not have them, ask someone to add them for you. What else can I do to help prevent falls? Wear shoes that: Do not have high heels. Have rubber bottoms. Are comfortable and fit you well. Are closed at the toe. Do not wear sandals. If you use a stepladder: Make sure that it is fully opened. Do not climb a closed stepladder. Make sure that both sides of the stepladder are locked into place. Ask someone to hold it for you, if possible. Clearly mark and make sure that you can  see: Any grab bars or handrails. First and last steps. Where the edge of each step is. Use tools that help you move around (mobility aids) if they are needed. These include: Canes. Walkers. Scooters. Crutches. Turn on the lights when you go into a dark area. Replace any light bulbs as soon as they burn out. Set up your furniture so you have a clear path. Avoid moving your furniture around. If any of your floors are uneven, fix them. If there are any pets around you, be aware of where they are. Review your medicines with your doctor. Some medicines can make you feel dizzy. This can increase your chance of falling. Ask your doctor what other things that you can do to help prevent falls. This information is not intended to replace advice given to you by your health care provider. Make sure you discuss any questions you have with your health care provider. Document Released: 07/08/2009 Document Revised: 02/17/2016 Document Reviewed: 10/16/2014 Elsevier Interactive Patient Education  2017 [employer].

## 2022-05-09 NOTE — Progress Notes (Addendum)
Virtual Visit via Telephone Note  I connected with  Chad Morton on 05/09/22 at  8:30 AM EDT by telephone and verified that I am speaking with the correct person using two identifiers.  Medicare Annual Wellness visit completed telephonically due to Covid-19 pandemic.   Persons participating in this call: This Health Coach and this patient.   Location: Patient: home Provider: office   I discussed the limitations, risks, security and privacy concerns of performing an evaluation and management service by telephone and the availability of in person appointments. The patient expressed understanding and agreed to proceed.  Unable to perform video visit due to video visit attempted and failed and/or patient does not have video capability.   Some vital signs may be absent or patient reported.   Willette Brace, LPN   Subjective:   Chad Morton is a 66 y.o. male who presents for an Initial Medicare Annual Wellness Visit.  Review of Systems     Cardiac Risk Factors include: advanced age (>44mn, >>48women);dyslipidemia;male gender;hypertension;diabetes mellitus     Objective:    There were no vitals filed for this visit. There is no height or weight on file to calculate BMI.     05/09/2022    8:38 AM 10/10/2018    6:23 AM 10/03/2018   11:06 AM  Advanced Directives  Does Patient Have a Medical Advance Directive? Yes Yes Yes  Type of [place]of [place] will   Does patient want to make changes to medical advance directive?  No - Patient declined   Copy of [place] Chart? No - copy requested No - copy requested     Current Medications (verified) Outpatient Encounter Medications as of 05/09/2022  Medication Sig   Accu-Chek FastClix Lancets MISC USE TO TEST BLOOD SUGAR ONCE DAILY AS DIRECTED   aspirin EC 81 MG tablet Take 1 tablet by mouth once. As needed   benazepril (LOTENSIN) 40 MG  tablet TAKE 1 TABLET BY MOUTH ONCE DAILY   blood glucose meter kit and supplies Dispense based on patient and insurance preference. Use daily as directed to test blood sugar (E11.9).   brimonidine (ALPHAGAN) 0.2 % ophthalmic solution Instill 1 drop into affected eye(s) twice daily as directed   dorzolamide-timolol (COSOPT) 22.3-6.8 MG/ML ophthalmic solution Instill 1 drop into affected eye(s) 2 times daily as directed   Empagliflozin (JARDIANCE PO) Take by mouth.   empagliflozin (JARDIANCE) 25 MG TABS tablet Take 1 tablet (25 mg total) by mouth daily before breakfast.   glimepiride (AMARYL) 4 MG tablet TAKE 1 TABLET BY MOUTH ONCE DAILY WITH BREAKFAST   glucose blood test strip USE TO CHECK BLOOD GLUCOSE DAILY.   glucose blood test strip Once daily dx e11.9   linagliptin (TRADJENTA) 5 MG TABS tablet Take 1 tablet (5 mg total) by mouth daily.   metFORMIN (GLUCOPHAGE-XR) 500 MG 24 hr tablet TAKE 2 TABLETS BY MOUTH TWICE DAILY   rosuvastatin (CRESTOR) 20 MG tablet TAKE 1 TABLET BY MOUTH ONCE DAILY   [DISCONTINUED] empagliflozin (JARDIANCE) 25 MG TABS tablet Take 1 tablet (25 mg total) by mouth daily before breakfast.   No facility-administered encounter medications on file as of 05/09/2022.    Allergies (verified) Diphtheria toxoid   History: Past Medical History:  Diagnosis Date   Blood transfusion without reported diagnosis    at birth    Diabetes mellitus without complication ([place]    Glaucoma    [place]  [facility]   Hyperlipidemia    Hypertension    Post-operative nausea and vomiting    Past Surgical History:  Procedure Laterality Date   CARPAL TUNNEL RELEASE Left 10/10/2018   Procedure: LEFT CARPAL TUNNEL RELEASE;  Surgeon: Mcarthur Rossetti, MD;  Location: [facility];  Service: Orthopedics;  Laterality: Left;   COLONOSCOPY  2010   KNEE ARTHROSCOPY W/ ACL RECONSTRUCTION Bilateral    both knees x1   MANDIBLE FRACTURE SURGERY Bilateral     TONSILECTOMY/ADENOIDECTOMY WITH MYRINGOTOMY     childhood   Family History  Problem Relation Age of Onset   Myasthenia gravis Mother        complications from this   Colon cancer Father        diagnosed 91- died within a year of diagnosis   Diabetes Brother    Hyperlipidemia Brother    Social History   Socioeconomic History   Marital status: Married    Spouse name: Not on file   Number of children: Not on file   Years of education: Not on file   Highest education level: Not on file  Occupational History   Not on file  Tobacco Use   Smoking status: Never   Smokeless tobacco: Never  Vaping Use   Vaping Use: Never used  Substance and Sexual Activity   Alcohol use: Yes    Alcohol/week: 10.0 - 12.0 standard drinks of alcohol    Types: 10 - 12 Standard drinks or equivalent per week   Drug use: No   Sexual activity: Yes  Other Topics Concern   Not on file  Social History Narrative   Married. 3 kids. 5 grandkids       Home remodeling. Wife works for [employer] for at least 2 more years in 2021. [employer]- geography major       Hobbies: time with family, beach, football (season tickets to [employer])   Social Determinants of Health   Financial Resource Strain: Low Risk  (05/09/2022)   Overall Financial Resource Strain (CARDIA)    Difficulty of Paying Living Expenses: Not hard at all  Food Insecurity: No Food Insecurity (05/09/2022)   Hunger Vital Sign    Worried About Running Out of Food in the Last Year: Never true    Ran Out of Food in the Last Year: Never true  Transportation Needs: No Transportation Needs (05/09/2022)   PRAPARE - Hydrologist (Medical): No    Lack of Transportation (Non-Medical): No  Physical Activity: Inactive (05/09/2022)   Exercise Vital Sign    Days of Exercise per Week: 0 days    Minutes of Exercise per Session: 0 min  Stress: No Stress Concern Present (05/09/2022)   [place]    Feeling of Stress : Not at all  Social Connections: Moderately Isolated (05/09/2022)   Social Connection and Isolation Panel [NHANES]    Frequency of Communication with Friends and Family: Three times a week    Frequency of Social Gatherings with Friends and Family: More than three times a week    Attends Religious Services: Never    Marine scientist or Organizations: No    Attends Music therapist: Never    Marital Status: Married    Tobacco Counseling Counseling given: Not Answered   Clinical Intake:  Pre-visit preparation completed: Yes  Pain : No/denies pain     BMI - recorded: 27.34 Nutritional Status: BMI 25 -  29 Overweight Nutritional Risks: None Diabetes: Yes CBG done?: No Did pt. bring in CBG monitor from home?: No  How often do you need to have someone help you when you read instructions, pamphlets, or other written materials from your doctor or pharmacy?: 1 - Never  Diabetic?Nutrition Risk Assessment:  Has the patient had any N/V/D within the last 2 months?  No  Does the patient have any non-healing wounds?  No  Has the patient had any unintentional weight loss or weight gain?  No   Diabetes:  Is the patient diabetic?  Yes  If diabetic, was a CBG obtained today?  No  Did the patient bring in their glucometer from home?  No  How often do you monitor your CBG's? N/A.   Financial Strains and Diabetes Management:  Are you having any financial strains with the device, your supplies or your medication? No .  Does the patient want to be seen by Chronic Care Management for management of their diabetes?  No  Would the patient like to be referred to a Nutritionist or for Diabetic Management?  No   Diabetic Exams:  Diabetic Eye Exam: Completed 04/12/22 Diabetic Foot Exam: Completed 05/24/21   Interpreter Needed?: No  Information entered by :: Charlott Rakes, LPN   Activities of Daily Living    05/09/2022    8:39 AM  05/24/2021    1:02 PM  In your present state of health, do you have any difficulty performing the following activities:  Hearing? 0 0  Vision? 0 0  Difficulty concentrating or making decisions? 0 0  Walking or climbing stairs? 0 0  Dressing or bathing? 0 0  Doing errands, shopping? 0 0  Preparing Food and eating ? N   Using the Toilet? N   In the past six months, have you accidently leaked urine? N   Do you have problems with loss of bowel control? N   Managing your Medications? N   Managing your Finances? N   Housekeeping or managing your Housekeeping? N     Patient Care Team: Marin Olp, MD as PCP - General (Family Medicine) Dr. Avie Arenas as Consulting Physician (Ophthalmology) [place] as Consulting Physician (Orthopedic Surgery) Dr. Deatra Ina as Consulting Physician (Dentistry)  Indicate any recent Medical Services you may have received from other than [facility] providers in the past year (date may be approximate).     Assessment:   This is a routine wellness examination for [place].  Hearing/Vision screen Hearing Screening - Comments:: Pt denies any hearing issues  Vision Screening - Comments:: Pt follows up with Dr Blair Heys   Dietary issues and exercise activities discussed: Current Exercise Habits: The patient has a physically strenuous job, but has no regular exercise apart from work.   Goals Addressed             This Visit's Progress    Patient Stated       Lose 10 lbs        Depression Screen    05/09/2022    8:37 AM 05/24/2021    1:02 PM 11/25/2019    2:55 PM 08/14/2018   10:00 AM 10/09/2017    9:23 AM  PHQ 2/9 Scores  PHQ - 2 Score 0 0 0 0 0  PHQ- 9 Score   0      Fall Risk    05/09/2022    8:39 AM 05/24/2021    1:01 PM 05/20/2020    4:35 PM 10/09/2017  9:[place] in the past year? 0 0 0 No  Number falls in past yr: 0 0 0   Injury with Fall? 0 0 0   Risk for fall due to : Impaired vision No Fall Risks     Follow up Falls prevention discussed Falls evaluation completed      FALL RISK PREVENTION PERTAINING TO THE HOME:  Any stairs in or around the home? Yes  If so, are there any without handrails? No  Home free of loose throw rugs in walkways, pet beds, electrical cords, etc? Yes  Adequate lighting in your home to reduce risk of falls? Yes   ASSISTIVE DEVICES UTILIZED TO PREVENT FALLS:  Life alert? No  Use of a cane, walker or w/c? No  Grab bars in the bathroom? No  Shower chair or bench in shower? Yes  Elevated toilet seat or a handicapped toilet? No   TIMED UP AND GO:  Was the test performed? No .   Cognitive Function:        05/09/2022    8:49 AM  6CIT Screen  What Year? 0 points  What month? 0 points  What time? 0 points  Count back from 20 0 points  Months in reverse 0 points  Repeat phrase 0 points  Total Score 0 points    Immunizations Immunization History  Administered Date(s) Administered   PFIZER(Purple Top)SARS-COV-2 Vaccination 12/11/2019, 01/05/2020   Pneumococcal Polysaccharide-23 10/09/2017   Zoster Recombinat (Shingrix) 11/25/2019, 05/24/2021      Flu Vaccine status: Due, Education has been provided regarding the importance of this vaccine. Advised may receive this vaccine at local pharmacy or Health Dept. Aware to provide a copy of the vaccination record if obtained from local pharmacy or Health Dept. Verbalized acceptance and understanding.  Pneumococcal vaccine status: Due, Education has been provided regarding the importance of this vaccine. Advised may receive this vaccine at local pharmacy or Health Dept. Aware to provide a copy of the vaccination record if obtained from local pharmacy or Health Dept. Verbalized acceptance and understanding.  Covid-19 vaccine status: Completed vaccines  Qualifies for Shingles Vaccine? Yes   Zostavax completed Yes   Shingrix Completed?: No.    Education has been provided regarding the importance of this  vaccine. Patient has been advised to call insurance company to determine out of pocket expense if they have not yet received this vaccine. Advised may also receive vaccine at local pharmacy or Health Dept. Verbalized acceptance and understanding.  Screening Tests Health Maintenance  Topic Date Due   COVID-19 Vaccine (3 - Pfizer risk series) 02/02/2020   Pneumonia Vaccine 20+ Years old (2 - PCV) 04/18/2021   INFLUENZA VACCINE  04/25/2022   FOOT EXAM  05/24/2022   HEMOGLOBIN A1C  05/28/2022   COLONOSCOPY (Pts 45-67yr Insurance coverage will need to be confirmed)  01/10/2023   OPHTHALMOLOGY EXAM  04/13/2023   Hepatitis C Screening  Completed   Zoster Vaccines- Shingrix  Completed   HPV VACCINES  Aged Out    Health Maintenance  Health Maintenance Due  Topic Date Due   COVID-19 Vaccine (3 - Pfizer risk series) 02/02/2020   Pneumonia Vaccine 66 Years old (2 - PCV) 04/18/2021   INFLUENZA VACCINE  04/25/2022    Colorectal cancer screening: Type of screening: Colonoscopy. Completed 01/09/18. Repeat every 5 years  Additional Screening:  Hepatitis C Screening:  Completed 10/09/17  Vision Screening: Recommended annual ophthalmology exams for early detection of glaucoma and other  disorders of the eye. Is the patient up to date with their annual eye exam?  Yes  Who is the provider or what is the name of the office in which the patient attends annual eye exams? Dr Oswaldo Conroy  If pt is not established with a provider, would they like to be referred to a provider to establish care? No .   Dental Screening: Recommended annual dental exams for proper oral hygiene  Community Resource Referral / Chronic Care Management: CRR required this visit?  No   CCM required this visit?  No      Plan:     I have personally reviewed and noted the following in the patient's chart:   Medical and social history Use of alcohol, tobacco or illicit drugs  Current medications and supplements including opioid  prescriptions. Patient is not currently taking opioid prescriptions. Functional ability and status Nutritional status Physical activity Advanced directives List of other physicians Hospitalizations, surgeries, and ER visits in previous 12 months Vitals Screenings to include cognitive, depression, and falls Referrals and appointments  In addition, I have reviewed and discussed with patient certain preventive protocols, quality metrics, and best practice recommendations. A written personalized care plan for preventive services as well as general preventive health recommendations were providednone to patient.     Willette Brace, LPN   [id number]/[id number]   Nurse Notes: none

## 2022-05-25 ENCOUNTER — Encounter: Payer: PPO | Admitting: Family Medicine

## 2022-06-06 ENCOUNTER — Other Ambulatory Visit (HOSPITAL_BASED_OUTPATIENT_CLINIC_OR_DEPARTMENT_OTHER): Payer: Self-pay

## 2022-06-07 ENCOUNTER — Other Ambulatory Visit (HOSPITAL_BASED_OUTPATIENT_CLINIC_OR_DEPARTMENT_OTHER): Payer: Self-pay

## 2022-06-07 MED ORDER — BRIMONIDINE TARTRATE 0.2 % OP SOLN
OPHTHALMIC | 1 refills | Status: DC
  Filled 2022-06-07: qty 5, 25d supply, fill #0
  Filled 2022-07-19: qty 15, 75d supply, fill #1
  Filled 2022-10-16: qty 10, 50d supply, fill #2

## 2022-06-09 ENCOUNTER — Other Ambulatory Visit (HOSPITAL_BASED_OUTPATIENT_CLINIC_OR_DEPARTMENT_OTHER): Payer: Self-pay

## 2022-06-15 ENCOUNTER — Other Ambulatory Visit (HOSPITAL_BASED_OUTPATIENT_CLINIC_OR_DEPARTMENT_OTHER): Payer: Self-pay

## 2022-06-19 ENCOUNTER — Encounter: Payer: Self-pay | Admitting: *Deleted

## 2022-06-29 ENCOUNTER — Encounter: Payer: Self-pay | Admitting: Family Medicine

## 2022-07-03 ENCOUNTER — Ambulatory Visit (INDEPENDENT_AMBULATORY_CARE_PROVIDER_SITE_OTHER): Payer: PPO | Admitting: Family Medicine

## 2022-07-03 ENCOUNTER — Encounter: Payer: Self-pay | Admitting: Family Medicine

## 2022-07-03 VITALS — BP 126/78 | HR 95 | Temp 97.9°F | Ht 68.0 in | Wt 179.6 lb

## 2022-07-03 DIAGNOSIS — E119 Type 2 diabetes mellitus without complications: Secondary | ICD-10-CM

## 2022-07-03 DIAGNOSIS — E1169 Type 2 diabetes mellitus with other specified complication: Secondary | ICD-10-CM

## 2022-07-03 DIAGNOSIS — E538 Deficiency of other specified B group vitamins: Secondary | ICD-10-CM

## 2022-07-03 DIAGNOSIS — M722 Plantar fascial fibromatosis: Secondary | ICD-10-CM | POA: Diagnosis not present

## 2022-07-03 DIAGNOSIS — I10 Essential (primary) hypertension: Secondary | ICD-10-CM | POA: Diagnosis not present

## 2022-07-03 DIAGNOSIS — Z125 Encounter for screening for malignant neoplasm of prostate: Secondary | ICD-10-CM

## 2022-07-03 DIAGNOSIS — E785 Hyperlipidemia, unspecified: Secondary | ICD-10-CM

## 2022-07-03 DIAGNOSIS — Z0000 Encounter for general adult medical examination without abnormal findings: Secondary | ICD-10-CM

## 2022-07-03 NOTE — Patient Instructions (Addendum)
Team please give him exercises for plantar fasciitis I want you to do the exercise 3x a week for a month then once a week for another month. Stop any exercise that causes more than 1-2/10 pain increase. If not doing better within 1-2 months let us refer you to sports medicine or podiatry  Can trial 1 aleve twice a day for 1-2 weeks- watch blood pressure if you do this- want less than 140/90   Schedule a lab visit at the check out desk at least 2 days before your physical in January- at that time Return for future fasting labs meaning nothing but water after midnight please. Ok to take your medications with water.    You wanted to hold off on a1c until next visit Recommended follow up: Return for next already scheduled visit or sooner if needed.

## 2022-07-03 NOTE — Progress Notes (Signed)
Phone [phone] In person visit   Subjective:   Chad Morton is a 66 y.o. 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 10/09/2017    Priority: High   Type 2 diabetes mellitus without complication, without long-term current use of insulin ([place]) 05/30/2017    Priority: High   B12 deficiency 11/25/2021    Priority: Medium    Hypertension, essential 10/09/2017    Priority: Medium    Hyperlipidemia associated with type 2 diabetes mellitus ([place]) 10/09/2017    Priority: Medium    Glaucoma     Priority: Medium    Status post carpal tunnel release 10/24/2018    Priority: Low   Carpal tunnel syndrome 07/09/2018    Priority: Low   Degenerative arthritis of right knee 01/16/2018    Priority: Low   Family history of colon cancer 10/09/2017    Priority: Low    Medications- reviewed and updated Current Outpatient Medications  Medication Sig Dispense Refill   Accu-Chek FastClix Lancets MISC USE TO TEST BLOOD SUGAR ONCE DAILY AS DIRECTED 102 each 4   aspirin EC 81 MG tablet Take 1 tablet by mouth once. As needed     benazepril (LOTENSIN) 40 MG tablet TAKE 1 TABLET BY MOUTH ONCE DAILY 90 tablet 2   blood glucose meter kit and supplies Dispense based on patient and insurance preference. Use daily as directed to test blood sugar (E11.9). 1 each 0   brimonidine (ALPHAGAN) 0.2 % ophthalmic solution Instill 1 drop into affected eye(s) twice daily as needed. 15 mL 1   dorzolamide-timolol (COSOPT) 22.3-6.8 MG/ML ophthalmic solution Instill 1 drop into affected eye(s) 2 times daily as directed 15 mL 1   Empagliflozin (JARDIANCE PO) Take by mouth.     glimepiride (AMARYL) 4 MG tablet TAKE 1 TABLET BY MOUTH ONCE DAILY  WITH BREAKFAST 90 tablet 3   glucose blood test strip USE TO CHECK BLOOD GLUCOSE DAILY. 100 each 0   glucose blood test strip Once daily dx e11.9 100 each 3   linagliptin (TRADJENTA) 5 MG TABS tablet Take 1 tablet (5 mg total) by mouth daily. 90 tablet 3   metFORMIN (GLUCOPHAGE-XR) 500 MG 24 hr tablet TAKE 2 TABLETS BY MOUTH TWICE DAILY 360 tablet 3   rosuvastatin (CRESTOR) 20 MG tablet TAKE 1 TABLET BY MOUTH ONCE DAILY 90 tablet 3   No current facility-administered medications for this visit.     Objective:  BP 126/78   Pulse 95   Temp 97.9 F (36.6 C)   Ht _0  (1.727 m)   Wt 179 lb 9.6 oz (81.5 kg)   SpO2 98%   BMI 27.31 kg/m  Gen: NAD, resting comfortably CV: RRR no murmurs rubs or gallops Lungs: CTAB no crackles, wheeze, rhonchi Ext: no edema Skin: warm, dry MSK: Normal range of motion of the foot with no pain over ankle or forefoot or midfoot-does have pain at insertion point of plantar fascia consistent with prior pain reported    Assessment and Plan   # Right heel pain S: Patient reports several months of symptoms-complains of heel pain on the right . Not worsening but not improving. 5/10 at its worst first thing in the morning at  insertion of plantar fascia onto his heel.  Also worsens with sedentary activity and then standing again during the day. -occasional stretching -has not tried medication A/P: Exam consistent with plantar fasciitis-Home exercise regimen was given as well as instructions on use from the sports medicine advisor.  He prefers over-the-counter NSAIDs   # Diabetes-peak A1c of 8.26 November 2019 S: Medication: jardiance 25 mg, metformin 1057m BID XR,  glimepride 4 mg, tradjenta 5 mg added last visit due to A1c over 7 Lab Results  Component Value Date   HGBA1C 7.6 (H) 11/25/2021   HGBA1C 7.6 (H) 05/24/2021   HGBA1C 7.1 (H) 05/26/2020  A/P: Hopefully improving-he wants to hold off on repeating A1c until January visit-I went ahead and ordered physical  labs  #hyperlipidemia S: Medication:rosuvastatin 252m aspirin 8191mLab Results  Component Value Date   CHOL 145 05/24/2021   HDL 58.20 05/24/2021   LDLCALC 113 (H) 11/25/2019   LDLDIRECT 61.0 05/24/2021   TRIG 223.0 (H) 05/24/2021   CHOLHDL 2 05/24/2021   A/P: Well-controlled on last visit-with LDL under 70-continue current medication and update lipids a few days before physical labs  #hypertension S: medication: benazepril 43m49m Readings from Last 3 Encounters:  07/03/22 126/78  11/25/21 118/76  05/24/21 124/68   A/P: Well-controlled-continue current medication and encouraged him to monitor blood pressure while on benazepril  Recommended follow up: Return for next already scheduled visit or sooner if needed. Future Appointments  Date Time Provider [place]2/2024  8:30 AM [facility] LAB [facility]  10/02/2022 11:00 AM HuntMarin Olp [facility]  05/17/2023  8:45 AM [facility] HEALTH COACH [facility]    Lab/Order associations:   ICD-10-CM   1. Plantar fasciitis, right  M72.2     2. Type 2 diabetes mellitus without complication, without long-term current use of insulin (HCC)  E11.9 CBC with Differential/Platelet    Comprehensive metabolic panel    Lipid panel    Hemoglobin A1c    Microalbumin / creatinine urine ratio    3. Hypertension, essential  I10     4. Hyperlipidemia associated with type 2 diabetes mellitus (HCC)[place]11.69    E78.5     5. Screening for prostate cancer  Z12.5 PSA    6. Preventative health care  Z00.00 CBC with Differential/Platelet    Comprehensive metabolic panel    Lipid panel    Hemoglobin A1c    PSA    Microalbumin / creatinine urine ratio    Vitamin B12    7. B12 deficiency  E53.8 Vitamin B12      No orders of the defined types were placed in this encounter.   Return precautions advised.  StepGarret Reddish

## 2022-07-19 ENCOUNTER — Other Ambulatory Visit: Payer: Self-pay | Admitting: Family Medicine

## 2022-07-19 ENCOUNTER — Other Ambulatory Visit (HOSPITAL_BASED_OUTPATIENT_CLINIC_OR_DEPARTMENT_OTHER): Payer: Self-pay

## 2022-07-19 DIAGNOSIS — E119 Type 2 diabetes mellitus without complications: Secondary | ICD-10-CM

## 2022-07-19 MED ORDER — METFORMIN HCL ER 500 MG PO TB24
1000.0000 mg | ORAL_TABLET | Freq: Two times a day (BID) | ORAL | 3 refills | Status: DC
  Filled 2022-07-19: qty 360, 90d supply, fill #0
  Filled 2022-10-16: qty 360, 90d supply, fill #1
  Filled 2023-01-03: qty 360, 90d supply, fill #2
  Filled 2023-04-03: qty 360, 90d supply, fill #3

## 2022-07-20 ENCOUNTER — Other Ambulatory Visit (HOSPITAL_BASED_OUTPATIENT_CLINIC_OR_DEPARTMENT_OTHER): Payer: Self-pay

## 2022-07-20 MED ORDER — DORZOLAMIDE HCL-TIMOLOL MAL 2-0.5 % OP SOLN
OPHTHALMIC | 1 refills | Status: DC
  Filled 2022-07-20: qty 10, 50d supply, fill #0
  Filled 2022-09-11: qty 10, 50d supply, fill #1
  Filled 2022-10-31: qty 10, 50d supply, fill #2

## 2022-08-04 ENCOUNTER — Other Ambulatory Visit (HOSPITAL_BASED_OUTPATIENT_CLINIC_OR_DEPARTMENT_OTHER): Payer: Self-pay

## 2022-08-04 ENCOUNTER — Other Ambulatory Visit: Payer: Self-pay | Admitting: Family Medicine

## 2022-08-04 DIAGNOSIS — E119 Type 2 diabetes mellitus without complications: Secondary | ICD-10-CM

## 2022-08-07 ENCOUNTER — Other Ambulatory Visit (HOSPITAL_BASED_OUTPATIENT_CLINIC_OR_DEPARTMENT_OTHER): Payer: Self-pay

## 2022-08-07 MED ORDER — GLIMEPIRIDE 4 MG PO TABS
4.0000 mg | ORAL_TABLET | Freq: Every day | ORAL | 3 refills | Status: DC
  Filled 2022-08-07: qty 90, 90d supply, fill #0
  Filled 2022-11-10: qty 90, 90d supply, fill #1

## 2022-09-11 ENCOUNTER — Other Ambulatory Visit (HOSPITAL_BASED_OUTPATIENT_CLINIC_OR_DEPARTMENT_OTHER): Payer: Self-pay

## 2022-09-25 HISTORY — PX: COLONOSCOPY: SHX174

## 2022-09-26 ENCOUNTER — Other Ambulatory Visit: Payer: PPO

## 2022-10-02 ENCOUNTER — Ambulatory Visit (INDEPENDENT_AMBULATORY_CARE_PROVIDER_SITE_OTHER): Payer: Medicare HMO | Admitting: Family Medicine

## 2022-10-02 ENCOUNTER — Encounter: Payer: Self-pay | Admitting: Family Medicine

## 2022-10-02 VITALS — BP 112/64 | HR 89 | Temp 98.5°F | Ht 68.0 in | Wt 179.0 lb

## 2022-10-02 DIAGNOSIS — E785 Hyperlipidemia, unspecified: Secondary | ICD-10-CM | POA: Diagnosis not present

## 2022-10-02 DIAGNOSIS — Z0000 Encounter for general adult medical examination without abnormal findings: Secondary | ICD-10-CM | POA: Diagnosis not present

## 2022-10-02 DIAGNOSIS — E538 Deficiency of other specified B group vitamins: Secondary | ICD-10-CM | POA: Diagnosis not present

## 2022-10-02 DIAGNOSIS — R351 Nocturia: Secondary | ICD-10-CM | POA: Diagnosis not present

## 2022-10-02 DIAGNOSIS — E119 Type 2 diabetes mellitus without complications: Secondary | ICD-10-CM

## 2022-10-02 DIAGNOSIS — E1169 Type 2 diabetes mellitus with other specified complication: Secondary | ICD-10-CM

## 2022-10-02 DIAGNOSIS — I10 Essential (primary) hypertension: Secondary | ICD-10-CM | POA: Diagnosis not present

## 2022-10-02 LAB — MICROALBUMIN / CREATININE URINE RATIO
Creatinine,U: 67.1 mg/dL
Microalb Creat Ratio: 1 mg/g (ref 0.0–30.0)
Microalb, Ur: <0.7 mg/dL (ref 0.0–1.9)

## 2022-10-02 LAB — COMPREHENSIVE METABOLIC PANEL
ALT: 14 U/L (ref 0–53)
AST: 13 U/L (ref 0–37)
Albumin: 4.6 g/dL (ref 3.5–5.2)
Alkaline Phosphatase: 44 U/L (ref 39–117)
BUN: 20 mg/dL (ref 6–23)
CO2: 25 mEq/L (ref 19–32)
Calcium: 9.9 mg/dL (ref 8.4–10.5)
Chloride: 104 mEq/L (ref 96–112)
Creatinine, Ser: 1.04 mg/dL (ref 0.40–1.50)
GFR: 74.85 mL/min (ref >60.00)
Glucose, Bld: 101 mg/dL — ABNORMAL HIGH (ref 70–99)
Potassium: 5.5 mEq/L — ABNORMAL HIGH (ref 3.5–5.1)
Sodium: 140 mEq/L (ref 135–145)
Total Bilirubin: 0.8 mg/dL (ref 0.2–1.2)
Total Protein: 7.3 g/dL (ref 6.0–8.3)

## 2022-10-02 LAB — CBC WITH DIFFERENTIAL/PLATELET
Basophils Absolute: 0 10*3/uL (ref 0.0–0.1)
Basophils Relative: 0.4 % (ref 0.0–3.0)
Eosinophils Absolute: 0.5 10*3/uL (ref 0.0–0.7)
Eosinophils Relative: 5.6 % — ABNORMAL HIGH (ref 0.0–5.0)
HCT: 45.9 % (ref 39.0–52.0)
Hemoglobin: 15.7 g/dL (ref 13.0–17.0)
Lymphocytes Relative: 28.1 % (ref 12.0–46.0)
Lymphs Abs: 2.3 10*3/uL (ref 0.7–4.0)
MCHC: 34.1 g/dL (ref 30.0–36.0)
MCV: 91.1 fl (ref 78.0–100.0)
Monocytes Absolute: 0.6 10*3/uL (ref 0.1–1.0)
Monocytes Relative: 7.2 % (ref 3.0–12.0)
Neutro Abs: 4.8 10*3/uL (ref 1.4–7.7)
Neutrophils Relative %: 58.7 % (ref 43.0–77.0)
Platelets: 255 10*3/uL (ref 150.0–400.0)
RBC: 5.04 Mil/uL (ref 4.22–5.81)
RDW: 13.2 % (ref 11.5–15.5)
WBC: 8.2 10*3/uL (ref 4.0–10.5)

## 2022-10-02 LAB — PSA: PSA: 1.22 ng/mL (ref 0.10–4.00)

## 2022-10-02 LAB — LIPID PANEL
Cholesterol: 141 mg/dL (ref 0–200)
HDL: 67.5 mg/dL (ref >39.00)
LDL Cholesterol: 48 mg/dL (ref 0–99)
NonHDL: 73.21
Total CHOL/HDL Ratio: 2
Triglycerides: 127 mg/dL (ref 0.0–149.0)
VLDL: 25.4 mg/dL (ref 0.0–40.0)

## 2022-10-02 LAB — VITAMIN B12: Vitamin B-12: 582 pg/mL (ref 211–911)

## 2022-10-02 LAB — HEMOGLOBIN A1C: Hgb A1c MFr Bld: 6.9 % — ABNORMAL HIGH (ref 4.6–6.5)

## 2022-10-02 MED ORDER — LINAGLIPTIN 5 MG PO TABS: 5.0000 mg | ORAL_TABLET | Freq: Every day | ORAL | 3 refills | Status: DC

## 2022-10-02 MED ORDER — EMPAGLIFLOZIN 25 MG PO TABS: 25.0000 mg | ORAL_TABLET | Freq: Every day | ORAL | 3 refills | Status: DC

## 2022-10-02 NOTE — Patient Instructions (Addendum)
Blood pressure running lower this year. If you are seeing that a lot at home or if feel lightheaded can try half tablet of benazepril then update me perhaps 2 weeks later   Please stop by lab before you go If you have mychart- we will send your results within 3 business days of [place] receiving them.  If you do not have mychart- we will call you about results within 5 business days of [place] receiving them.  *please also note that you will see labs on mychart as soon as they post. I will later go in and write notes on them- will say "notes from Dr. Yong Channel"   Recommended follow up: Return in about 4 months (around 01/31/2023) for followup or sooner if needed.Schedule b4 you leave.  6 months if a1c controlled

## 2022-10-02 NOTE — Progress Notes (Signed)
Phone: [phone]   Subjective:  Patient presents today for their annual physical. Chief complaint-noted.   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 10/09/2017    Priority: High   Type 2 diabetes mellitus without complication, without long-term current use of insulin ([place]) 05/30/2017    Priority: High   B12 deficiency 11/25/2021    Priority: Medium    Hypertension, essential 10/09/2017    Priority: Medium    Hyperlipidemia associated with type 2 diabetes mellitus ([place]) 10/09/2017    Priority: Medium    Glaucoma     Priority: Medium    Status post carpal tunnel release 10/24/2018    Priority: Low   Carpal tunnel syndrome 07/09/2018    Priority: Low   Degenerative arthritis of right knee 01/16/2018    Priority: Low   Family history of colon cancer 10/09/2017    Priority: Low   Past Surgical History:  Procedure Laterality Date   CARPAL TUNNEL RELEASE Left 10/10/2018   Procedure: LEFT CARPAL TUNNEL RELEASE;  Surgeon: Mcarthur Rossetti, MD;  Location: [facility];  Service: Orthopedics;  Laterality: Left;   COLONOSCOPY  2010   KNEE ARTHROSCOPY W/ ACL RECONSTRUCTION Bilateral    both knees x1   MANDIBLE FRACTURE SURGERY Bilateral    TONSILECTOMY/ADENOIDECTOMY WITH MYRINGOTOMY     childhood    Family History  Problem Relation Age of Onset   Myasthenia gravis Mother        complications from this   Colon cancer Father        diagnosed 49- died within a year of diagnosis    Diabetes Brother    Hyperlipidemia Brother     Medications- reviewed and updated Current Outpatient Medications  Medication Sig Dispense Refill   Accu-Chek FastClix Lancets MISC USE TO TEST BLOOD SUGAR ONCE DAILY AS DIRECTED 102 each 4   aspirin EC 81 MG tablet Take 1 tablet by mouth once. As needed     benazepril (LOTENSIN) 40 MG tablet TAKE 1 TABLET BY MOUTH ONCE DAILY 90 tablet 2   blood glucose meter kit and supplies Dispense based on patient and insurance preference. Use daily as directed to test blood sugar (E11.9). 1 each 0   brimonidine (ALPHAGAN) 0.2 % ophthalmic solution Instill 1 drop into affected eye(s) twice daily as needed. 15 mL 1   dorzolamide-timolol (COSOPT) 2-0.5 % ophthalmic solution Instill 1 drop into affected eye(s) 2 times daily as directed 15 mL 1   glimepiride (AMARYL) 4 MG tablet Take 1 tablet (4 mg total) by mouth daily with breakfast. 90 tablet 3   glucose blood test strip Once daily dx e11.9 100 each 3   metFORMIN (GLUCOPHAGE-XR) 500 MG 24 hr tablet Take 2 tablets (1,000 mg total) by mouth 2 (two) times daily. 360 tablet 3   rosuvastatin (CRESTOR) 20 MG tablet TAKE 1 TABLET BY MOUTH ONCE DAILY 90 tablet 3   empagliflozin (JARDIANCE) 25 MG TABS tablet Take 1 tablet (25 mg total) by mouth  daily. 90 tablet 3   linagliptin (TRADJENTA) 5 MG TABS tablet Take 1 tablet (5 mg total) by mouth daily. 90 tablet 3   No current facility-administered medications for this visit.    Allergies-reviewed and updated Allergies  Allergen Reactions   Diphtheria Toxoid Swelling    Social History   Social History Narrative   Married. 3 kids. 5 grandkids       Home remodeling. Wife works for [employer] for at least 2 more years in 2021. [employer]- geography major       Hobbies: time with family, beach, football (season tickets to [employer])   Objective  Objective:  BP 112/64   Pulse 89   Temp 98.5 F (36.9 C)   Ht 5\' 8"  (1.727 m)   Wt 179 lb (81.2 kg)   SpO2 97%   BMI 27.22 kg/m   Gen: NAD, resting comfortably HEENT: Mucous membranes are moist. Oropharynx normal Neck: no thyromegaly CV: RRR no murmurs rubs or gallops Lungs: CTAB no crackles, wheeze, rhonchi Abdomen: soft/nontender/nondistended/normal bowel sounds. No rebound or guarding.  Ext: no edema Skin: warm, dry Neuro: grossly normal, moves all extremities, PERRLA   Assessment and Plan  67 y.o. male presenting for annual physical.  Health Maintenance counseling: 1. Anticipatory guidance: Patient counseled regarding regular dental exams -q6 months, eye exams - q6 months with glaucoma with [facility],  avoiding smoking and second hand smoke, limiting alcohol to 2 beverages per day - close to 2 a day but under, no illicit drugs .   2. Risk factor reduction:  Advised patient of need for regular exercise and diet rich and fruits and vegetables to reduce risk of heart attack and stroke.  Exercise- slightly less after passing of dog- 4-5 days a week plus very active with work.  Diet/weight management-Down 2 pounds from last physical- reasonable for most part.  Wt Readings from Last 3 Encounters:  10/02/22 179 lb (81.2 kg)  07/03/22 179 lb 9.6 oz (81.5 kg)  11/25/21 179 lb 12.8 oz (81.6 kg)  3. Immunizations/screenings/ancillary studies-not a candidate for Tdap due to previous history, has had third COVID-19 vaccination but wants to hold off on further plus had covid in november, declines flu shot, agrees to prevnar 20 next year tentatively Immunization History  Administered Date(s) Administered   PFIZER(Purple Top)SARS-COV-2 Vaccination 12/11/2019, 01/05/2020   Pneumococcal Polysaccharide-23 10/09/2017   Zoster Recombinat (Shingrix) 11/25/2019, 05/24/2021  4. Prostate cancer screening- PSA trend has been low risk-continue to trend with labs.  Nocturia occasionally  Lab Results  Component Value Date   PSA 1.45 05/24/2021   PSA 1.48 11/25/2019   PSA 1.55 08/14/2018  5. Colon cancer screening - Last  01/09/2018 and results were normal - a 5-year repeat is planned. Family history of colon cancer in father in lat 17's. Recall letter should be coming in next few months  6. Skin cancer screening- no dermatologist. advised regular sunscreen use. Denies worrisome, changing, or new skin lesions.  Last year patient had two small areas on right arm- stable today .  7. Never smoker  8. STD screening - Patient opts out as monogamous    Status of chronic or acute concerns   # Diabetes-peak A1c of 8.26 November 2019 S: Medication: jardiance 25 mg, Tradjenta 5 mg, metformin 1000mg  BID ER,  glimepride 4 mg CBGs- no recent checks Lab Results  Component Value Date   HGBA1C 7.6 (H) 11/25/2021   HGBA1C 7.6 (H) 05/24/2021   HGBA1C 7.1 (H) 05/26/2020  A/P: hopefully improved- update a1c today. Continue current meds for now  #hyperlipidemia S: Medication:rosuvastatin 20mg , aspirin 81mg   Lab Results  Component Value Date   CHOL 145 05/24/2021   HDL 58.20 05/24/2021   LDLCALC 113 (H) 11/25/2019   LDLDIRECT 61.0 05/24/2021   TRIG 223.0 (H) 05/24/2021   CHOLHDL 2 05/24/2021   A/P: hopefully stable- update lipid panel today. Continue current meds for now   #hypertension S: medication: benazepril 40mg  Home readings #s:no regular checks   BP Readings from Last 3 Encounters:  10/02/22 112/64  07/03/22 126/78  11/25/21 118/76  A/P: blood pressure running lower this year. If you are seeing that a lot at home or if feel lightheaded can try half tablet of benazepril then update me perhaps 2 weeks later   #Glaucoma-follows closely with ophthalmology q 6 months   # B12 deficiency S: Current treatment/medication (oral vs. IM):  otc b12  A/P: hopefully stable- update ba12 today. Continue current meds for now   # Plantar fasciitis-gave home exercises in October-patient reports improvement to nuisance status but not all gone.  Not bad enough hat would want injection  #Joint pain- mainly knees. Minimal relief  with pennsaid. Long time since injection. Dr. 12/01/22 in past.   Recommended follow up: Return in about 4 months (around 01/31/2023) for followup or sooner if needed.Schedule b4 you leave. Future Appointments  Date Time Provider Department Center  05/17/2023  8:45 AM [facility]   Lab/Order associations:NOT fasting   ICD-10-CM   1. Preventative health care  Z00.00     2. Type 2 diabetes mellitus without complication, without long-term current use of insulin (HCC)  E11.9 CBC with Differential/Platelet    Comprehensive metabolic panel    Lipid panel    HgB A1c    Microalbumin / creatinine urine ratio    3. B12 deficiency  E53.8 Vitamin B12    4. Hyperlipidemia associated with type 2 diabetes mellitus (HCC)  E11.69    E78.5     5. Hypertension, essential  I10     6. Nocturia  R35.1 PSA      Meds ordered this encounter  Medications   linagliptin (TRADJENTA) 5 MG TABS tablet    Sig: Take 1 tablet (5 mg total) by mouth daily.    Dispense:  90 tablet    Refill:  3   empagliflozin (JARDIANCE) 25 MG TABS tablet    Sig: Take 1 tablet (25 mg total) by mouth daily.    Dispense:  90 tablet    Refill:  3    Return precautions advised.  Berline Chough, MD

## 2022-10-16 ENCOUNTER — Other Ambulatory Visit (HOSPITAL_BASED_OUTPATIENT_CLINIC_OR_DEPARTMENT_OTHER): Payer: Self-pay

## 2022-10-23 DIAGNOSIS — H401132 Primary open-angle glaucoma, bilateral, moderate stage: Secondary | ICD-10-CM | POA: Diagnosis not present

## 2022-10-31 ENCOUNTER — Other Ambulatory Visit (HOSPITAL_BASED_OUTPATIENT_CLINIC_OR_DEPARTMENT_OTHER): Payer: Self-pay

## 2022-11-10 ENCOUNTER — Other Ambulatory Visit: Payer: Self-pay | Admitting: Family Medicine

## 2022-11-10 ENCOUNTER — Other Ambulatory Visit (HOSPITAL_BASED_OUTPATIENT_CLINIC_OR_DEPARTMENT_OTHER): Payer: Self-pay

## 2022-11-10 MED ORDER — EMPAGLIFLOZIN 25 MG PO TABS
25.0000 mg | ORAL_TABLET | Freq: Every day | ORAL | 11 refills | Status: DC
  Filled 2022-11-10: qty 30, 30d supply, fill #0
  Filled 2023-01-24: qty 30, 30d supply, fill #1
  Filled 2023-02-21: qty 30, 30d supply, fill #2
  Filled 2023-03-23: qty 30, 30d supply, fill #3
  Filled 2023-10-11: qty 30, 30d supply, fill #4
  Filled 2023-11-09: qty 30, 30d supply, fill #5

## 2022-11-10 MED ORDER — LINAGLIPTIN 5 MG PO TABS
5.0000 mg | ORAL_TABLET | Freq: Every day | ORAL | 3 refills | Status: DC
  Filled 2022-11-10: qty 90, 90d supply, fill #0

## 2022-11-12 ENCOUNTER — Other Ambulatory Visit (HOSPITAL_BASED_OUTPATIENT_CLINIC_OR_DEPARTMENT_OTHER): Payer: Self-pay

## 2022-11-13 ENCOUNTER — Other Ambulatory Visit (HOSPITAL_BASED_OUTPATIENT_CLINIC_OR_DEPARTMENT_OTHER): Payer: Self-pay

## 2022-11-13 ENCOUNTER — Other Ambulatory Visit: Payer: Self-pay

## 2022-12-08 ENCOUNTER — Other Ambulatory Visit: Payer: Self-pay | Admitting: Family Medicine

## 2022-12-08 ENCOUNTER — Other Ambulatory Visit (HOSPITAL_BASED_OUTPATIENT_CLINIC_OR_DEPARTMENT_OTHER): Payer: Self-pay

## 2022-12-08 MED ORDER — ROSUVASTATIN CALCIUM 20 MG PO TABS
20.0000 mg | ORAL_TABLET | Freq: Every day | ORAL | 3 refills | Status: DC
  Filled 2022-12-08: qty 90, 90d supply, fill #0
  Filled 2023-02-21: qty 90, 90d supply, fill #1
  Filled 2023-05-29: qty 90, 90d supply, fill #2
  Filled 2023-08-30: qty 90, 90d supply, fill #3

## 2023-01-03 ENCOUNTER — Other Ambulatory Visit (HOSPITAL_BASED_OUTPATIENT_CLINIC_OR_DEPARTMENT_OTHER): Payer: Self-pay

## 2023-01-03 ENCOUNTER — Other Ambulatory Visit: Payer: Self-pay

## 2023-01-03 MED ORDER — BRIMONIDINE TARTRATE 0.2 % OP SOLN
OPHTHALMIC | 2 refills | Status: DC
  Filled 2023-01-03: qty 15, 75d supply, fill #0

## 2023-01-03 MED ORDER — DORZOLAMIDE HCL-TIMOLOL MAL 2-0.5 % OP SOLN
OPHTHALMIC | 2 refills | Status: DC
  Filled 2023-01-03: qty 20, 100d supply, fill #0
  Filled 2023-03-14: qty 20, 100d supply, fill #1

## 2023-01-19 ENCOUNTER — Other Ambulatory Visit (HOSPITAL_BASED_OUTPATIENT_CLINIC_OR_DEPARTMENT_OTHER): Payer: Self-pay

## 2023-01-24 ENCOUNTER — Other Ambulatory Visit (HOSPITAL_BASED_OUTPATIENT_CLINIC_OR_DEPARTMENT_OTHER): Payer: Self-pay

## 2023-01-24 ENCOUNTER — Other Ambulatory Visit: Payer: Self-pay | Admitting: Family Medicine

## 2023-01-24 DIAGNOSIS — I10 Essential (primary) hypertension: Secondary | ICD-10-CM

## 2023-01-24 MED ORDER — BENAZEPRIL HCL 40 MG PO TABS
40.0000 mg | ORAL_TABLET | Freq: Every day | ORAL | 2 refills | Status: DC
  Filled 2023-01-24: qty 90, 90d supply, fill #0
  Filled 2023-04-19 (×2): qty 90, 90d supply, fill #1
  Filled 2023-07-25: qty 90, 90d supply, fill #2

## 2023-01-26 ENCOUNTER — Encounter: Payer: Self-pay | Admitting: Gastroenterology

## 2023-01-31 ENCOUNTER — Ambulatory Visit (INDEPENDENT_AMBULATORY_CARE_PROVIDER_SITE_OTHER): Payer: Medicare HMO | Admitting: Family Medicine

## 2023-01-31 ENCOUNTER — Encounter: Payer: Self-pay | Admitting: Gastroenterology

## 2023-01-31 ENCOUNTER — Encounter: Payer: Self-pay | Admitting: Family Medicine

## 2023-01-31 ENCOUNTER — Other Ambulatory Visit: Payer: Self-pay | Admitting: Family Medicine

## 2023-01-31 VITALS — BP 128/80 | HR 91 | Temp 97.8°F | Ht 68.0 in | Wt 176.2 lb

## 2023-01-31 DIAGNOSIS — E1169 Type 2 diabetes mellitus with other specified complication: Secondary | ICD-10-CM

## 2023-01-31 DIAGNOSIS — Z7984 Long term (current) use of oral hypoglycemic drugs: Secondary | ICD-10-CM

## 2023-01-31 DIAGNOSIS — E785 Hyperlipidemia, unspecified: Secondary | ICD-10-CM

## 2023-01-31 DIAGNOSIS — E119 Type 2 diabetes mellitus without complications: Secondary | ICD-10-CM | POA: Diagnosis not present

## 2023-01-31 DIAGNOSIS — I10 Essential (primary) hypertension: Secondary | ICD-10-CM

## 2023-01-31 LAB — HEMOGLOBIN A1C: Hgb A1c MFr Bld: 7.3 % — ABNORMAL HIGH (ref 4.6–6.5)

## 2023-01-31 LAB — COMPREHENSIVE METABOLIC PANEL
ALT: 15 U/L (ref 0–53)
AST: 14 U/L (ref 0–37)
Albumin: 4.2 g/dL (ref 3.5–5.2)
Alkaline Phosphatase: 44 U/L (ref 39–117)
BUN: 18 mg/dL (ref 6–23)
CO2: 24 mEq/L (ref 19–32)
Calcium: 9.3 mg/dL (ref 8.4–10.5)
Chloride: 100 mEq/L (ref 96–112)
Creatinine, Ser: 1.13 mg/dL (ref 0.40–1.50)
GFR: 67.6 mL/min (ref >60.00)
Glucose, Bld: 303 mg/dL — ABNORMAL HIGH (ref 70–99)
Potassium: 4.6 mEq/L (ref 3.5–5.1)
Sodium: 134 mEq/L — ABNORMAL LOW (ref 135–145)
Total Bilirubin: 0.9 mg/dL (ref 0.2–1.2)
Total Protein: 6.6 g/dL (ref 6.0–8.3)

## 2023-01-31 NOTE — Patient Instructions (Addendum)
[facility] contact- call directly to schedule- they just sent you a letter Please call to schedule visit and/or procedure Address: [address] [place], [place], [place] [address] Phone: [phone]   Please stop by lab before you go If you have mychart- we will send your results within 3 business days of [place] receiving them.  If you do not have mychart- we will call you about results within 5 business days of [place] receiving them.  *please also note that you will see labs on mychart as soon as they post. I will later go in and write notes on them- will say "notes from Dr. Durene Cal"

## 2023-01-31 NOTE — Progress Notes (Signed)
Phone [phone] In person visit   Subjective:   Chad Morton is a 67 y.o. 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 10/09/2017    Priority: High   Type 2 diabetes mellitus without complication, without long-term current use of insulin (HCC) 05/30/2017    Priority: High   B12 deficiency 11/25/2021    Priority: Medium    Hypertension, essential 10/09/2017    Priority: Medium    Hyperlipidemia associated with type 2 diabetes mellitus (HCC) 10/09/2017    Priority: Medium    Glaucoma     Priority: Medium    Status post carpal tunnel release 10/24/2018    Priority: Low   Carpal tunnel syndrome 07/09/2018    Priority: Low   Degenerative arthritis of right knee 01/16/2018    Priority: Low   Family history of colon cancer 10/09/2017    Priority: Low    Medications- reviewed and updated Current Outpatient Medications  Medication Sig Dispense Refill   Accu-Chek FastClix Lancets MISC USE TO TEST BLOOD SUGAR ONCE DAILY AS DIRECTED 102 each 4   aspirin EC 81 MG tablet Take 1 tablet by mouth once. As needed     benazepril (LOTENSIN) 40 MG tablet Take 1 tablet (40 mg total) by mouth daily. 90 tablet 2   blood glucose meter kit and supplies Dispense based on patient and insurance preference. Use daily as directed to test blood sugar (E11.9). 1 each 0   brimonidine (ALPHAGAN) 0.2 % ophthalmic solution Instill 1 drop into affected eye(s) twice daily as needed. 15 mL 1   dorzolamide-timolol (COSOPT) 2-0.5 % ophthalmic solution Instill 1 drop into affected eye(s) 2 times daily as directed 15 mL 2   empagliflozin (JARDIANCE) 25 MG TABS tablet Take 1 tablet (25 mg total) by mouth daily. 90 tablet 3   empagliflozin (JARDIANCE)  25 MG TABS tablet Take 1 tablet (25 mg total) by mouth daily before breakfast. 30 tablet 11   glimepiride (AMARYL) 4 MG tablet Take 1 tablet (4 mg total) by mouth daily with breakfast. 90 tablet 3   glucose blood test strip Once daily dx e11.9 100 each 3   linagliptin (TRADJENTA) 5 MG TABS tablet Take 1 tablet (5 mg total) by mouth daily. 90 tablet 3   metFORMIN (GLUCOPHAGE-XR) 500 MG 24 hr tablet Take 2 tablets (1,000 mg total) by mouth 2 (two) times daily. 360 tablet 3   rosuvastatin (CRESTOR) 20 MG tablet Take 1 tablet (20 mg total) by mouth daily. 90 tablet 3   No current facility-administered medications for this visit.     Objective:  BP 128/80   Pulse 91   Temp 97.8 F (36.6 C)   Ht 5\' 8"  (1.727 m)   Wt 176 lb 3.2 oz (79.9 kg)   SpO2 98%   BMI 26.79 kg/m  Gen: NAD, resting comfortably CV: RRR no murmurs rubs or gallops Lungs: CTAB no crackles, wheeze, rhonchi Ext: no edema Skin: warm, dry    Assessment and Plan   # Diabetes-peak A1c of 8.26 November 2019 S: Medication: jardiance 25 mg, Tradjenta 5 mg, metformin 1000mg  BID,  glimepride 4 mg CBGs- not checking Exercise and diet- active with work- still trying to do some walking as well. Weight  down 3 lbs from last visit- has cut a few beers per week Lab Results  Component Value Date   HGBA1C 6.9 (H) 10/02/2022   HGBA1C 7.6 (H) 11/25/2021   HGBA1C 7.6 (H) 05/24/2021  A/P:  hopefully stable- update a1c today. Continue current meds for now  -check CMP as well as potassium high last visit but suspect lab error- although is on benazepril   #hyperlipidemia S: Medication:rosuvastatin 20mg , aspirin 81mg   Lab Results  Component Value Date   CHOL 141 10/02/2022   HDL 67.50 10/02/2022   LDLCALC 48 10/02/2022   LDLDIRECT 61.0 05/24/2021   TRIG 127.0 10/02/2022   CHOLHDL 2 10/02/2022   A/P:  stable/controlled- continue current medicines    #hypertension S: medication: benazepril 40mg  BP Readings from Last 3 Encounters:   01/31/23 128/80  10/02/22 112/64  07/03/22 126/78  A/P:  stable- continue current medicines   Recommended follow up: Return in about 4 months (around 06/03/2023) for followup or sooner if needed.Schedule b4 you leave. Future Appointments  Date Time Provider Department Center  05/17/2023  8:45 AM [facility] ANNUAL WELLNESS VISIT 1 [facility]   Lab/Order associations:   ICD-10-CM   1. Type 2 diabetes mellitus without complication, without long-term current use of insulin (HCC)  E11.9 Hemoglobin A1c    Comprehensive metabolic panel    2. Hypertension, essential  I10     3. Hyperlipidemia associated with type 2 diabetes mellitus (HCC)  E11.69    E78.5      No orders of the defined types were placed in this encounter.  Return precautions advised.  Tana Conch, MD

## 2023-02-01 ENCOUNTER — Ambulatory Visit: Payer: Medicare HMO | Admitting: Family Medicine

## 2023-02-01 ENCOUNTER — Other Ambulatory Visit: Payer: Self-pay

## 2023-02-01 ENCOUNTER — Other Ambulatory Visit (HOSPITAL_COMMUNITY): Payer: Self-pay

## 2023-02-01 ENCOUNTER — Encounter: Payer: Self-pay | Admitting: Pharmacist

## 2023-02-01 DIAGNOSIS — E119 Type 2 diabetes mellitus without complications: Secondary | ICD-10-CM

## 2023-02-01 MED ORDER — GLIMEPIRIDE 4 MG PO TABS
8.0000 mg | ORAL_TABLET | Freq: Every day | ORAL | 3 refills | Status: DC
Start: 2023-02-01 — End: 2023-06-07
  Filled 2023-02-01 – 2023-02-02 (×2): qty 180, 90d supply, fill #0
  Filled 2023-05-29: qty 180, 90d supply, fill #1

## 2023-02-02 ENCOUNTER — Other Ambulatory Visit (HOSPITAL_BASED_OUTPATIENT_CLINIC_OR_DEPARTMENT_OTHER): Payer: Self-pay

## 2023-02-02 ENCOUNTER — Other Ambulatory Visit: Payer: Self-pay

## 2023-02-13 ENCOUNTER — Encounter: Payer: Self-pay | Admitting: Family Medicine

## 2023-02-16 ENCOUNTER — Ambulatory Visit (INDEPENDENT_AMBULATORY_CARE_PROVIDER_SITE_OTHER): Payer: Medicare HMO | Admitting: Family Medicine

## 2023-02-16 ENCOUNTER — Encounter: Payer: Self-pay | Admitting: Family Medicine

## 2023-02-16 VITALS — BP 120/78 | HR 82 | Temp 98.5°F | Ht 68.0 in | Wt 174.2 lb

## 2023-02-16 DIAGNOSIS — Z7984 Long term (current) use of oral hypoglycemic drugs: Secondary | ICD-10-CM | POA: Diagnosis not present

## 2023-02-16 DIAGNOSIS — E119 Type 2 diabetes mellitus without complications: Secondary | ICD-10-CM

## 2023-02-16 DIAGNOSIS — M1712 Unilateral primary osteoarthritis, left knee: Secondary | ICD-10-CM

## 2023-02-16 DIAGNOSIS — I10 Essential (primary) hypertension: Secondary | ICD-10-CM | POA: Diagnosis not present

## 2023-02-16 NOTE — Patient Instructions (Addendum)
We have placed a referral for you today to sports medicine. You will see # listed below- you can call this if you have not heard within a week. Reach out to [place] if you are not scheduled within 2 weeks.   Can trial Voltaren gel 4x a day until visit to see if it will settle things down  Recommended follow up: Return for as needed for new, worsening, persistent symptoms.

## 2023-02-16 NOTE — Progress Notes (Signed)
Phone [phone] In person visit   Subjective:   Chad Morton is a 67 y.o. year old very pleasant male patient who presents for/with See problem oriented charting Chief Complaint  Patient presents with   Knee Pain    Pt c/o left knee pain, has had ACL reconstruction.    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 10/09/2017    Priority: High   Type 2 diabetes mellitus without complication, without long-term current use of insulin (HCC) 05/30/2017    Priority: High   B12 deficiency 11/25/2021    Priority: Medium    Hypertension, essential 10/09/2017    Priority: Medium    Hyperlipidemia associated with type 2 diabetes mellitus (HCC) 10/09/2017    Priority: Medium    Glaucoma     Priority: Medium    Status post carpal tunnel release 10/24/2018    Priority: Low   Carpal tunnel syndrome 07/09/2018    Priority: Low   Degenerative arthritis of right knee 01/16/2018    Priority: Low   Family history of colon cancer 10/09/2017    Priority: Low    Medications- reviewed and updated Current Outpatient Medications  Medication Sig Dispense Refill   Accu-Chek FastClix Lancets MISC USE TO TEST BLOOD SUGAR ONCE DAILY AS DIRECTED 102 each 4   aspirin EC 81 MG tablet Take 1 tablet by mouth once. As needed     benazepril (LOTENSIN) 40 MG tablet Take 1 tablet (40 mg total) by mouth daily. 90 tablet 2   blood glucose meter kit and supplies Dispense based on patient and insurance preference. Use daily as directed to test blood sugar (E11.9). 1 each 0   brimonidine (ALPHAGAN) 0.2 % ophthalmic solution Instill 1 drop into affected eye(s) twice daily as needed. 15 mL 1   dorzolamide-timolol (COSOPT) 2-0.5 % ophthalmic solution Instill 1 drop into affected eye(s) 2 times daily as directed 15 mL 2   empagliflozin (JARDIANCE) 25 MG TABS tablet Take 1 tablet (25 mg total) by mouth daily before breakfast. 30 tablet 11    glimepiride (AMARYL) 4 MG tablet Take 2 tablets (8 mg total) by mouth daily with breakfast. 180 tablet 3   glucose blood test strip Once daily dx e11.9 100 each 3   linagliptin (TRADJENTA) 5 MG TABS tablet Take 1 tablet (5 mg total) by mouth daily. 90 tablet 3   metFORMIN (GLUCOPHAGE-XR) 500 MG 24 hr tablet Take 2 tablets (1,000 mg total) by mouth 2 (two) times daily. 360 tablet 3   rosuvastatin (CRESTOR) 20 MG tablet Take 1 tablet (20 mg total) by mouth daily. 90 tablet 3   No current facility-administered medications for this visit.     Objective:  BP 120/78   Pulse 82   Temp 98.5 F (36.9 C)   Ht 5\' 8"  (1.727 m)   Wt 174 lb 3.2 oz (79 kg)   SpO2 97%   BMI 26.49 kg/m  Gen: NAD, resting comfortably  No significant asymmetry between left and right knee-no erythema noted on either knee   Assessment and Plan   # Left knee pain S: Patient with history of ACL reconstruction over 30 years ago.  Had seen Dr. Berline Chough  of sports medicine on 03/05/2018 with ongoing left knee pain-moderate to severe aching and sometimes stabbing that was nonradiating.  Worst when first getting out of bed in the morning.  Also having trouble bending the knee.  Pennsaid provided some  relief at that time.  He did receive steroid injection at that time with 40 mg/mL of Depo-Medrol (also required some aspiration) and was recommended to trial compression. X-rays in 2019 showed moderate tricompartmental degenerative changes.   Also in past has compensated for left knee pain and ends up with right knee pain so wants to be preventive  Recently has noted intermittent stabbing pain in left knee and regular aching- slightly better today. Today pain 4-5/10. Has tried some pennsaid/voltaren A/P: Patient with osteoarthritis of the left knee with recent worsening of knee pain.  History of ACL reconstruction.  We discussed several options including physical therapy, sports medicine consult, trial of Voltaren gel - She ultimately  opted to trial Voltaren gel 4 times a day until sports medicine consult which was placed today   # Diabetes-peak A1c of 8.26 November 2019 S: Medication: jardiance 25 mg, Tradjenta 5 mg, metformin 1000mg  BID,  glimepride 8 mg (recently increased) CBGs- not checking- backup tno low sugars as far as he is aware  Lab Results  Component Value Date   HGBA1C 7.3 (H) 01/31/2023   HGBA1C 6.9 (H) 10/02/2022   HGBA1C 7.6 (H) 11/25/2021  A/P: Hopefully A1c is improving with increased glimepiride.  We also discussed possibly changing Tradjenta to Ozempic in the future if not improving-keep follow-up in September  #hypertension S: medication: benazepril 40mg   BP Readings from Last 3 Encounters:  02/16/23 120/78  01/31/23 128/80  10/02/22 112/64  A/P: Well-controlled on repeat-continue current medication  Recommended follow up: Return for as needed for new, worsening, persistent symptoms.-Otherwise keep September appointment Future Appointments  Date Time Provider Department Center  03/21/2023  8:00 AM [facility] PREVISIT RM 50 [facility]  04/17/2023  9:30 AM Mansouraty, Netty Starring., MD [facility]  05/17/2023  8:45 AM [facility] ANNUAL WELLNESS VISIT 1 [facility]  06/07/2023 10:20 AM [name], MD [facility]    Lab/Order associations:   ICD-10-CM   1. Primary osteoarthritis of left knee  M17.12 Ambulatory referral to Sports Medicine    2. Type 2 diabetes mellitus without complication, without long-term current use of insulin (HCC)  E11.9     3. Hypertension, essential  I10      Return precautions advised.  Tana Conch, MD

## 2023-02-20 ENCOUNTER — Ambulatory Visit (INDEPENDENT_AMBULATORY_CARE_PROVIDER_SITE_OTHER): Payer: Medicare HMO | Admitting: Family Medicine

## 2023-02-20 ENCOUNTER — Encounter: Payer: Self-pay | Admitting: Family Medicine

## 2023-02-20 ENCOUNTER — Other Ambulatory Visit: Payer: Self-pay

## 2023-02-20 VITALS — BP 132/78 | HR 105 | Ht 68.0 in | Wt 173.6 lb

## 2023-02-20 DIAGNOSIS — G8929 Other chronic pain: Secondary | ICD-10-CM

## 2023-02-20 DIAGNOSIS — M25562 Pain in left knee: Secondary | ICD-10-CM

## 2023-02-20 NOTE — Progress Notes (Signed)
   I, [name], CMA acting as a scribe for Chad Graham, MD.  Cadell Heyser Demore is a 67 y.o. male who presents to [facility] today for L knee pain. Has a hx of ACL reconstruction. He's been cared for previously by Dr. Berline Chough. Pt locates pain to left knee, entire joint. Denies visible swelling. Notes sharp shooting pain with certain steps, feels like the knee will give out. Denies recent injury. Has tried Pennsaid with short-term relief. Sx flaring up over the past 3 weeks.   L Knee swelling: no Mechanical symptoms: yes Aggravates: ambulation Treatments tried: Pennsaid  Dx imaging: 03/05/18 L knee XR  Pertinent review of systems: No fevers or chills  Relevant historical information: Hypertension and diabetes.   Exam:  BP 132/78   Pulse (!) 105   Ht 5\' 8"  (1.727 m)   Wt 173 lb 9.6 oz (78.7 kg)   SpO2 96%   BMI 26.40 kg/m  General: Well Developed, well nourished, and in no acute distress.   MSK: Left knee mild effusion otherwise normal-appearing Normal motion with crepitation.  Stable ligamentous exam.  Intact strength.    Lab and Radiology Results  Procedure: Real-time Ultrasound Guided Injection of left knee joint superior lateral patellar space Device: Philips Affiniti 50G Images permanently stored and available for review in PACS Verbal informed consent obtained.  Discussed risks and benefits of procedure. Warned about infection, bleeding, hyperglycemia damage to structures among others. Patient expresses understanding and agreement Time-out conducted.   Noted no overlying erythema, induration, or other signs of local infection.   Skin prepped in a sterile fashion.   Local anesthesia: Topical Ethyl chloride.   With sterile technique and under real time ultrasound guidance: 40 mg of Kenalog and 2 mL Marcaine injected into knee joint. Fluid seen entering the joint capsule.   Completed without difficulty   Pain immediately resolved  suggesting accurate placement of the medication.   Advised to call if fevers/chills, erythema, induration, drainage, or persistent bleeding.   Images permanently stored and available for review in the ultrasound unit.  Impression: Technically successful ultrasound guided injection.         Assessment and Plan: 67 y.o. male with left knee pain thought to be due to exacerbation of DJD.  His last x-ray was several years ago showing moderate DJD.  He did well with a steroid injection 5 years ago.  Will repeat steroid injection today.  We talked about repeating x-ray of his knee and he like to wait a little bit.  Check back as needed.   PDMP not reviewed this encounter. Orders Placed This Encounter  Procedures   [place] LIMITED JOINT SPACE STRUCTURES LOW LEFT(NO LINKED CHARGES)    Order Specific Question:   Reason for Exam (SYMPTOM  OR DIAGNOSIS REQUIRED)    Answer:   left knee pain    Order Specific Question:   Preferred imaging location?    Answer:   [facility]   No orders of the defined types were placed in this encounter.    Discussed warning signs or symptoms. Please see discharge instructions. Patient expresses understanding.   The above documentation has been reviewed and is accurate and complete Chad Morton, M.D.

## 2023-02-20 NOTE — Patient Instructions (Addendum)
Thank you for coming in today.   You received an injection today. Seek immediate medical attention if the joint becomes red, extremely painful, or is oozing fluid.   Please use Voltaren gel (Generic Diclofenac Gel) up to 4x daily for pain as needed.  This is available over-the-counter as both the name brand Voltaren gel and the generic diclofenac gel.   Check back as needed 

## 2023-02-21 ENCOUNTER — Other Ambulatory Visit (HOSPITAL_BASED_OUTPATIENT_CLINIC_OR_DEPARTMENT_OTHER): Payer: Self-pay

## 2023-03-03 ENCOUNTER — Emergency Department (HOSPITAL_BASED_OUTPATIENT_CLINIC_OR_DEPARTMENT_OTHER): Payer: Medicare HMO | Admitting: Radiology

## 2023-03-03 ENCOUNTER — Other Ambulatory Visit: Payer: Self-pay

## 2023-03-03 ENCOUNTER — Emergency Department (HOSPITAL_BASED_OUTPATIENT_CLINIC_OR_DEPARTMENT_OTHER): Payer: Medicare HMO

## 2023-03-03 ENCOUNTER — Emergency Department (HOSPITAL_BASED_OUTPATIENT_CLINIC_OR_DEPARTMENT_OTHER)
Admission: EM | Admit: 2023-03-03 | Discharge: 2023-03-03 | Disposition: A | Discharge status: Home / Self Care | Payer: Medicare HMO | Attending: Emergency Medicine | Admitting: Emergency Medicine

## 2023-03-03 DIAGNOSIS — M546 Pain in thoracic spine: Secondary | ICD-10-CM | POA: Diagnosis not present

## 2023-03-03 DIAGNOSIS — W19XXXA Unspecified fall, initial encounter: Secondary | ICD-10-CM

## 2023-03-03 DIAGNOSIS — W11XXXA Fall on and from ladder, initial encounter: Secondary | ICD-10-CM | POA: Diagnosis not present

## 2023-03-03 DIAGNOSIS — Z7984 Long term (current) use of oral hypoglycemic drugs: Secondary | ICD-10-CM | POA: Insufficient documentation

## 2023-03-03 DIAGNOSIS — I10 Essential (primary) hypertension: Secondary | ICD-10-CM | POA: Diagnosis not present

## 2023-03-03 DIAGNOSIS — Z7982 Long term (current) use of aspirin: Secondary | ICD-10-CM | POA: Diagnosis not present

## 2023-03-03 DIAGNOSIS — S0101XA Laceration without foreign body of scalp, initial encounter: Secondary | ICD-10-CM | POA: Diagnosis not present

## 2023-03-03 DIAGNOSIS — Z043 Encounter for examination and observation following other accident: Secondary | ICD-10-CM | POA: Diagnosis not present

## 2023-03-03 DIAGNOSIS — S0990XA Unspecified injury of head, initial encounter: Secondary | ICD-10-CM | POA: Diagnosis not present

## 2023-03-03 DIAGNOSIS — E119 Type 2 diabetes mellitus without complications: Secondary | ICD-10-CM | POA: Insufficient documentation

## 2023-03-03 MED ORDER — LIDOCAINE-EPINEPHRINE (PF) 2 %-1:200000 IJ SOLN
10.0000 mL | Freq: Once | INTRAMUSCULAR | Status: DC
  Filled 2023-03-03: qty 20

## 2023-03-03 NOTE — ED Provider Notes (Incomplete)
[place] EMERGENCY DEPARTMENT AT [facility] Provider Note   CSN: [id number] Arrival date & time: 03/03/23  1821     History {Add pertinent medical, surgical, social history, OB history to HPI:1} Chief Complaint  Patient presents with  . Fall  . Head Injury    Chad Morton Kevorkian is a 67 y.o. male.   Fall  Head Injury Patient presents after fall.  Fell around 6 feet from a ladder while cutting tree limbs.  Complaining of pain and mid back and has laceration to back of head.  Some mild pain to back of head.  Was initially confused.  Not on blood thinners.  States last tetanus has been within 10 years.    Past Medical History:  Diagnosis Date  . 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 Diabetes mellitus without complication (HCC)   . Glaucoma    [facility]  . Hyperlipidemia   . Hypertension   . Post-operative nausea and vomiting     Home Medications Prior to Admission medications   Medication Sig Start Date End Date Taking? Authorizing Provider  Accu-Chek FastClix Lancets MISC USE TO TEST BLOOD SUGAR ONCE DAILY AS DIRECTED 01/28/20   [name], MD  aspirin EC 81 MG tablet Take 1 tablet by mouth once. As needed    [provider]  benazepril (LOTENSIN) 40 MG tablet Take 1 tablet (40 mg total) by mouth daily. 01/24/23   [name], MD  blood glucose meter kit and supplies Dispense based on patient and insurance preference. Use daily as directed to test blood sugar (E11.9). 03/14/18   [name], MD  brimonidine (ALPHAGAN) 0.2 % ophthalmic solution Instill 1 drop into affected eye(s) twice daily as needed. 06/07/22     dorzolamide-timolol (COSOPT) 2-0.5 % ophthalmic solution Instill 1 drop into affected eye(s) 2 times daily as directed 01/03/23     empagliflozin (JARDIANCE) 25 MG TABS tablet Take 1 tablet (25 mg total) by mouth daily before breakfast. 11/10/22 11/10/23  [name], MD  glimepiride (AMARYL) 4 MG  tablet Take 2 tablets (8 mg total) by mouth daily with breakfast. 02/01/23   [name], MD  glucose blood test strip Once daily dx e11.9 01/28/20   [name], MD  linagliptin (TRADJENTA) 5 MG TABS tablet Take 1 tablet (5 mg total) by mouth daily. 11/10/22   [name], MD  metFORMIN (GLUCOPHAGE-XR) 500 MG 24 hr tablet Take 2 tablets (1,000 mg total) by mouth 2 (two) times daily. 07/19/22 07/19/23  [name], MD  rosuvastatin (CRESTOR) 20 MG tablet Take 1 tablet (20 mg total) by mouth daily. 12/08/22   [name], MD      Allergies    Diphtheria toxoid    Review of Systems   Review of Systems  Physical Exam Updated Vital Signs BP 131/85   Pulse (!) 104   Temp 98.5 F (36.9 C)   Resp 14   Ht 5\' 8"  (1.727 m)   Wt 79.2 kg   SpO2 98%   BMI 26.55 kg/m  Physical Exam Vitals and nursing note reviewed.  HENT:     Head:     Comments: Right posterior scalp has approximate 1 cm laceration and then a separate approximately 4 cm laceration/flap. Eyes:     Pupils: Pupils are equal, round, and reactive to light.  Chest:     Chest wall: No tenderness.  Abdominal:  Tenderness: There is no abdominal tenderness.  Musculoskeletal:     Cervical back: Neck supple. No tenderness.     Comments: Mild tenderness to mid thoracic spine.  No deformity.  Slight abrasion to right posterior chest wall.     ED Results / Procedures / Treatments   Labs (all labs ordered are listed, but only abnormal results are displayed) Labs Reviewed - No data to display  EKG None  Radiology No results found.  Procedures .Marland KitchenLaceration Repair  Date/Time: 03/03/2023 2:00 PM  Performed by: Benjiman Core, MD Authorized by: Benjiman Core, MD   Consent:    Consent obtained:  Verbal   Consent given by:  Patient   Risks discussed:  Infection, pain, need for additional repair and poor cosmetic result Universal protocol:    Patient identity confirmed:  Verbally with  patient and arm band Anesthesia:    Anesthesia method:  Local infiltration   Local anesthetic:  Lidocaine 1% WITH epi Laceration details:    Location:  Scalp   Scalp location:  Occipital   {Document cardiac monitor, telemetry assessment procedure when appropriate:1}  Medications Ordered in ED Medications  lidocaine-EPINEPHrine (XYLOCAINE W/EPI) 2 %-1:200000 (PF) injection 10 mL (has no administration in time range)    ED Course/ Medical Decision Making/ A&P   {   Click here for ABCD2, HEART and other calculatorsREFRESH Note before signing :1}                          Medical Decision Making Amount and/or Complexity of Data Reviewed Radiology: ordered.  Risk Prescription drug management.   Patient with fall.  Mid back pain.  Will get x-ray to evaluate for cause such as thoracic spine fracture.  Also 2 lacerations to scalp.  Hit head and will get CT scan.  Will close wound. Head CT reassuring.  Wounds closed although the larger wound was even larger than initially seen.  Closed with staples.  Outpatient follow-up.  No intracranial hemorrhage on the CT scan.  X-ray does not show thoracic spine fracture. {Document critical care time when appropriate:1} {Document review of labs and clinical decision tools ie heart score, Chads2Vasc2 etc:1}  {Document your independent review of radiology images, and any outside records:1} {Document your discussion with family members, caretakers, and with consultants:1} {Document social determinants of health affecting pt's care:1} {Document your decision making why or why not admission, treatments were needed:1} Final Clinical Impression(s) / ED Diagnoses Final diagnoses:  None    Rx / DC Orders ED Discharge Orders     None

## 2023-03-03 NOTE — ED Notes (Signed)
Pt's wound irrigated with 1L sterile water

## 2023-03-03 NOTE — Discharge Instructions (Signed)
Have the staples taken out in about 10 to 14 days.  Watch for signs of infection.

## 2023-03-03 NOTE — ED Provider Notes (Signed)
[place] EMERGENCY DEPARTMENT AT [facility] Provider Note   CSN: [id number] Arrival date & time: 03/03/23  1821     History {Add pertinent medical, surgical, social history, OB history to HPI:1} Chief Complaint  Patient presents with   Fall   Head Injury    Chad Morton is a 67 y.o. male.   Fall  Head Injury Patient presents after fall.  Fell around 6 feet from a ladder while cutting tree limbs.  Complaining of pain and mid back and has laceration to back of head.  Some mild pain to back of head.  Was initially confused.  Not on blood thinners.  States last tetanus has been within 10 years.    Past Medical History:  Diagnosis Date   Blood transfusion without reported diagnosis    at birth    Diabetes mellitus without complication (HCC)    Glaucoma    [facility]   Hyperlipidemia    Hypertension    Post-operative nausea and vomiting     Home Medications Prior to Admission medications   Medication Sig Start Date End Date Taking? Authorizing Provider  Accu-Chek FastClix Lancets MISC USE TO TEST BLOOD SUGAR ONCE DAILY AS DIRECTED 01/28/20   [name], MD  aspirin EC 81 MG tablet Take 1 tablet by mouth once. As needed    [provider]  benazepril (LOTENSIN) 40 MG tablet Take 1 tablet (40 mg total) by mouth daily. 01/24/23   [name], MD  blood glucose meter kit and supplies Dispense based on patient and insurance preference. Use daily as directed to test blood sugar (E11.9). 03/14/18   [name], MD  brimonidine (ALPHAGAN) 0.2 % ophthalmic solution Instill 1 drop into affected eye(s) twice daily as needed. 06/07/22     dorzolamide-timolol (COSOPT) 2-0.5 % ophthalmic solution Instill 1 drop into affected eye(s) 2 times daily as directed 01/03/23     empagliflozin (JARDIANCE) 25 MG TABS tablet Take 1 tablet (25 mg total) by mouth daily before breakfast. 11/10/22 11/10/23  [name], MD  glimepiride (AMARYL) 4 MG tablet  Take 2 tablets (8 mg total) by mouth daily with breakfast. 02/01/23   [name], MD  glucose blood test strip Once daily dx e11.9 01/28/20   [name], MD  linagliptin (TRADJENTA) 5 MG TABS tablet Take 1 tablet (5 mg total) by mouth daily. 11/10/22   [name], MD  metFORMIN (GLUCOPHAGE-XR) 500 MG 24 hr tablet Take 2 tablets (1,000 mg total) by mouth 2 (two) times daily. 07/19/22 07/19/23  [name], MD  rosuvastatin (CRESTOR) 20 MG tablet Take 1 tablet (20 mg total) by mouth daily. 12/08/22   [name], MD      Allergies    Diphtheria toxoid    Review of Systems   Review of Systems  Physical Exam Updated Vital Signs BP 131/85   Pulse (!) 104   Temp 98.5 F (36.9 C)   Resp 14   Ht 5\' 8"  (1.727 m)   Wt 79.2 kg   SpO2 98%   BMI 26.55 kg/m  Physical Exam Vitals and nursing note reviewed.  HENT:     Head:     Comments: Right posterior scalp has approximate 1 cm laceration and then a separate approximately 4 cm laceration/flap. Eyes:     Pupils: Pupils are equal, round, and reactive to light.  Chest:     Chest wall: No tenderness.  Abdominal:  Tenderness: There is no abdominal tenderness.  Musculoskeletal:     Cervical back: Neck supple. No tenderness.     Comments: Mild tenderness to mid thoracic spine.  No deformity.  Slight abrasion to right posterior chest wall.     ED Results / Procedures / Treatments   Labs (all labs ordered are listed, but only abnormal results are displayed) Labs Reviewed - No data to display  EKG None  Radiology No results found.  Procedures Procedures  {Document cardiac monitor, telemetry assessment procedure when appropriate:1}  Medications Ordered in ED Medications  lidocaine-EPINEPHrine (XYLOCAINE W/EPI) 2 %-1:200000 (PF) injection 10 mL (has no administration in time range)    ED Course/ Medical Decision Making/ A&P   {   Click here for ABCD2, HEART and other calculatorsREFRESH Note before  signing :1}                          Medical Decision Making Amount and/or Complexity of Data Reviewed Radiology: ordered.  Risk Prescription drug management.   Patient with fall.  Mid back pain.  Will get x-ray to evaluate for cause such as thoracic spine fracture.  Also 2 lacerations to scalp.  Hit head and will get CT scan.  Will close wound.  {Document critical care time when appropriate:1} {Document review of labs and clinical decision tools ie heart score, Chads2Vasc2 etc:1}  {Document your independent review of radiology images, and any outside records:1} {Document your discussion with family members, caretakers, and with consultants:1} {Document social determinants of health affecting pt's care:1} {Document your decision making why or why not admission, treatments were needed:1} Final Clinical Impression(s) / ED Diagnoses Final diagnoses:  None    Rx / DC Orders ED Discharge Orders     None

## 2023-03-03 NOTE — ED Notes (Addendum)
Pt voiced understanding of d/c instructions and staple care.  Pt denies any dizziness. Assisted pt out to waiting room with wife.

## 2023-03-03 NOTE — ED Triage Notes (Signed)
A+Ox4. Ambulatory to triage.   Fell from ~96feet on ladder while cutting tree limb.2hrs PTA. Landed on back and sacrum. Laceration to back of head-bleeding controlled without pressure. No LOC, no thinners, no neck pain. Abrasion to upper right back seen- superficial. Pupils =reactive 4mm. No tenderness in spine or neck.

## 2023-03-04 ENCOUNTER — Encounter: Payer: Self-pay | Admitting: Family Medicine

## 2023-03-05 NOTE — Telephone Encounter (Signed)
Attempted to reach patient but received no answer , lvm

## 2023-03-07 ENCOUNTER — Telehealth: Payer: Self-pay

## 2023-03-07 NOTE — Telephone Encounter (Signed)
Transition Care Management Unsuccessful Follow-up Telephone Call  Date of discharge and from where:  [facility] 6/8  Attempts:  1st Attempt  Reason for unsuccessful TCM follow-up call:  Left voice message   [facility] Guide, [facility] [phone] [address] [place], [place], [place] [address] Phone: [phone] Email: [email or web address]

## 2023-03-08 ENCOUNTER — Telehealth: Payer: Self-pay

## 2023-03-08 NOTE — Telephone Encounter (Signed)
Transition Care Management Unsuccessful Follow-up Telephone Call  Date of discharge and from where:  [facility] 6/8  Attempts:  2nd  Reason for unsuccessful TCM follow-up call:  Unable to leave message   [facility] Guide, [facility] [phone] [address] [place], [place], [place] [address] Phone: [phone] Email: [email or web address]

## 2023-03-14 ENCOUNTER — Other Ambulatory Visit (HOSPITAL_BASED_OUTPATIENT_CLINIC_OR_DEPARTMENT_OTHER): Payer: Self-pay

## 2023-03-14 ENCOUNTER — Ambulatory Visit (INDEPENDENT_AMBULATORY_CARE_PROVIDER_SITE_OTHER): Payer: Medicare HMO | Admitting: Family

## 2023-03-14 VITALS — BP 127/83 | HR 79 | Temp 98.0°F | Ht 68.0 in | Wt 169.6 lb

## 2023-03-14 DIAGNOSIS — S0101XA Laceration without foreign body of scalp, initial encounter: Secondary | ICD-10-CM

## 2023-03-14 NOTE — Progress Notes (Signed)
Patient ID: BB ROZEBOOM, male    DOB: May 23, 1956, 67 y.o.   MRN: [id number]  Chief Complaint  Patient presents with   Suture / Staple Removal    Pt would like stapels removed from back of head, Seen in ED on 6/8 after falling around 6 feet from a ladder while cutting tree limbs.    HPI:      Head wound:  pt seen in ER 10 days ago for head laceration and told he had 12 staples placed. He denies any sx of persistent HA, nausea or dizziness after the injury. States he had some mild bleeding at the site after the staple closure, but not since. He has washed the area with soap & water. He was advised to come in 10-14d after for staple removal.     Assessment & Plan:  1. Laceration of scalp without foreign body, initial encounter - pt reports he was told he had 12 staples placed, also noted in ER note, however, only see & removed 11 staples today and also had my CMA observe. Verbal consent for removal received. Scant amount of bleeding noted on smaller laceration. Both wounds cleaned with betadine, Bacitracin ointment applied to bleeding area. Advised pt one staple could possibly be embedded under scab he has in the vertical laceration, which is thick. Did not want to remove scab today. Advised pt to continue gentle washing daily with soap & water, apply small amt of abt ointment to right wound for next few days & return if he or his wife feel/see a staple within the next week.  - Remove staples  Subjective:    Outpatient Medications Prior to Visit  Medication Sig Dispense Refill   benazepril (LOTENSIN) 40 MG tablet Take 1 tablet (40 mg total) by mouth daily. 90 tablet 2   Accu-Chek FastClix Lancets MISC USE TO TEST BLOOD SUGAR ONCE DAILY AS DIRECTED 102 each 4   aspirin EC 81 MG tablet Take 1 tablet by mouth once. As needed (Patient not taking: Reported on 03/14/2023)     blood glucose meter kit and supplies Dispense based on patient and insurance preference. Use daily as directed to test  blood sugar (E11.9). 1 each 0   brimonidine (ALPHAGAN) 0.2 % ophthalmic solution Instill 1 drop into affected eye(s) twice daily as needed. 15 mL 1   dorzolamide-timolol (COSOPT) 2-0.5 % ophthalmic solution Instill 1 drop into affected eye(s) 2 times daily as directed 15 mL 2   empagliflozin (JARDIANCE) 25 MG TABS tablet Take 1 tablet (25 mg total) by mouth daily before breakfast. 30 tablet 11   glimepiride (AMARYL) 4 MG tablet Take 2 tablets (8 mg total) by mouth daily with breakfast. 180 tablet 3   glucose blood test strip Once daily dx e11.9 100 each 3   linagliptin (TRADJENTA) 5 MG TABS tablet Take 1 tablet (5 mg total) by mouth daily. 90 tablet 3   metFORMIN (GLUCOPHAGE-XR) 500 MG 24 hr tablet Take 2 tablets (1,000 mg total) by mouth 2 (two) times daily. 360 tablet 3   rosuvastatin (CRESTOR) 20 MG tablet Take 1 tablet (20 mg total) by mouth daily. 90 tablet 3   No facility-administered medications prior to visit.   Past Medical History:  Diagnosis Date   Blood transfusion without reported diagnosis    at birth    Diabetes mellitus without complication (HCC)    Glaucoma    [facility]   Hyperlipidemia    Hypertension    Post-operative nausea and  vomiting    Past Surgical History:  Procedure Laterality Date   CARPAL TUNNEL RELEASE Left 10/10/2018   Procedure: LEFT CARPAL TUNNEL RELEASE;  Surgeon: Kathryne Hitch, MD;  Location: [facility];  Service: Orthopedics;  Laterality: Left;   COLONOSCOPY  2010   KNEE ARTHROSCOPY W/ ACL RECONSTRUCTION Bilateral    both knees x1   MANDIBLE FRACTURE SURGERY Bilateral    TONSILECTOMY/ADENOIDECTOMY WITH MYRINGOTOMY     childhood   Allergies  Allergen Reactions   Diphtheria Toxoid Swelling      Objective:    Physical Exam Vitals and nursing note reviewed.  Constitutional:      General: He is not in acute distress.    Appearance: Normal appearance.  HENT:     Head: Normocephalic.   Cardiovascular:      Rate and Rhythm: Normal rate and regular rhythm.  Pulmonary:     Effort: Pulmonary effort is normal.     Breath sounds: Normal breath sounds.  Musculoskeletal:        General: Normal range of motion.     Cervical back: Normal range of motion.  Skin:    General: Skin is warm and dry.     Findings: Laceration (approx 7cm vertical closed laceration on left middle posterior scalp with staples intact, approx. 2cm wound to the right with 2 staples intact.) present.  Neurological:     Mental Status: He is alert and oriented to person, place, and time.  Psychiatric:        Mood and Affect: Mood normal.    BP 127/83   Pulse 79   Temp 98 F (36.7 C) (Temporal)   Ht 5\' 8"  (1.727 m)   Wt 169 lb 9.6 oz (76.9 kg)   SpO2 99%   BMI 25.79 kg/m  Wt Readings from Last 3 Encounters:  03/14/23 169 lb 9.6 oz (76.9 kg)  03/03/23 174 lb 9.6 oz (79.2 kg)  02/20/23 173 lb 9.6 oz (78.7 kg)       Dulce Sellar, NP

## 2023-03-21 ENCOUNTER — Ambulatory Visit (AMBULATORY_SURGERY_CENTER): Payer: Medicare HMO

## 2023-03-21 ENCOUNTER — Encounter: Payer: Self-pay | Admitting: Gastroenterology

## 2023-03-21 ENCOUNTER — Other Ambulatory Visit (HOSPITAL_BASED_OUTPATIENT_CLINIC_OR_DEPARTMENT_OTHER): Payer: Self-pay

## 2023-03-21 VITALS — Ht 68.0 in | Wt 170.0 lb

## 2023-03-21 DIAGNOSIS — Z86010 Personal history of colonic polyps: Secondary | ICD-10-CM

## 2023-03-21 DIAGNOSIS — Z800 Family history of malignant neoplasm of digestive organs: Secondary | ICD-10-CM

## 2023-03-21 MED ORDER — NA SULFATE-K SULFATE-MG SULF 17.5-3.13-1.6 GM/177ML PO SOLN
1.0000 | Freq: Once | ORAL | 0 refills | Status: AC
Start: 2023-03-21 — End: 2023-03-29
  Filled 2023-03-21: qty 354, 1d supply, fill #0

## 2023-03-21 NOTE — Progress Notes (Signed)

## 2023-03-23 ENCOUNTER — Other Ambulatory Visit (HOSPITAL_BASED_OUTPATIENT_CLINIC_OR_DEPARTMENT_OTHER): Payer: Self-pay

## 2023-03-26 ENCOUNTER — Other Ambulatory Visit (HOSPITAL_BASED_OUTPATIENT_CLINIC_OR_DEPARTMENT_OTHER): Payer: Self-pay

## 2023-03-26 ENCOUNTER — Other Ambulatory Visit: Payer: Self-pay

## 2023-03-26 MED ORDER — BRIMONIDINE TARTRATE 0.2 % OP SOLN
1.0000 [drp] | Freq: Two times a day (BID) | OPHTHALMIC | 1 refills | Status: DC | PRN
  Filled 2023-03-26: qty 15, 100d supply, fill #0

## 2023-03-26 MED ORDER — BRIMONIDINE TARTRATE 0.2 % OP SOLN
1.0000 [drp] | Freq: Two times a day (BID) | OPHTHALMIC | 2 refills | Status: DC
  Filled 2023-03-26: qty 15, 100d supply, fill #0
  Filled 2023-06-27: qty 15, 100d supply, fill #1
  Filled 2023-09-28: qty 15, 100d supply, fill #2

## 2023-03-26 MED ORDER — DORZOLAMIDE HCL-TIMOLOL MAL 2-0.5 % OP SOLN
1.0000 [drp] | Freq: Two times a day (BID) | OPHTHALMIC | 1 refills | Status: DC
  Filled 2023-03-26: qty 20, 200d supply, fill #0
  Filled 2023-05-29: qty 10, 50d supply, fill #0

## 2023-03-28 ENCOUNTER — Other Ambulatory Visit (HOSPITAL_BASED_OUTPATIENT_CLINIC_OR_DEPARTMENT_OTHER): Payer: Self-pay

## 2023-04-03 ENCOUNTER — Other Ambulatory Visit (HOSPITAL_BASED_OUTPATIENT_CLINIC_OR_DEPARTMENT_OTHER): Payer: Self-pay

## 2023-04-05 ENCOUNTER — Encounter: Payer: Self-pay | Admitting: Family Medicine

## 2023-04-14 ENCOUNTER — Encounter: Payer: Self-pay | Admitting: Family Medicine

## 2023-04-17 ENCOUNTER — Ambulatory Visit: Payer: Medicare HMO | Admitting: Gastroenterology

## 2023-04-17 ENCOUNTER — Encounter: Payer: Self-pay | Admitting: Gastroenterology

## 2023-04-17 VITALS — BP 114/70 | HR 77 | Temp 97.3°F | Resp 10 | Ht 68.0 in | Wt 170.0 lb

## 2023-04-17 DIAGNOSIS — Z800 Family history of malignant neoplasm of digestive organs: Secondary | ICD-10-CM

## 2023-04-17 DIAGNOSIS — Z86010 Personal history of colonic polyps: Secondary | ICD-10-CM | POA: Diagnosis not present

## 2023-04-17 DIAGNOSIS — Z1211 Encounter for screening for malignant neoplasm of colon: Secondary | ICD-10-CM | POA: Diagnosis not present

## 2023-04-17 DIAGNOSIS — E785 Hyperlipidemia, unspecified: Secondary | ICD-10-CM | POA: Diagnosis not present

## 2023-04-17 DIAGNOSIS — Z09 Encounter for follow-up examination after completed treatment for conditions other than malignant neoplasm: Secondary | ICD-10-CM

## 2023-04-17 DIAGNOSIS — D123 Benign neoplasm of transverse colon: Secondary | ICD-10-CM | POA: Diagnosis not present

## 2023-04-17 DIAGNOSIS — I10 Essential (primary) hypertension: Secondary | ICD-10-CM | POA: Diagnosis not present

## 2023-04-17 DIAGNOSIS — E119 Type 2 diabetes mellitus without complications: Secondary | ICD-10-CM | POA: Diagnosis not present

## 2023-04-17 MED ORDER — SODIUM CHLORIDE 0.9 % IV SOLN
500.0000 mL | Freq: Once | INTRAVENOUS | Status: DC
Start: 2023-04-17 — End: 2023-04-17

## 2023-04-17 NOTE — Patient Instructions (Signed)
Await pathology result. High fiber diet. Use FiberCon 1-2 tablets by mouth daily.  Continue present medications.  Repeat colonoscopy in 5 years for surveillance.  Handout on polyps, diverticulosis, and hemorrhoids provided.  YOU HAD AN ENDOSCOPIC PROCEDURE TODAY AT THE [facility]:   Refer to the procedure report that was given to you for any specific questions about what was found during the examination.  If the procedure report does not answer your questions, please call your gastroenterologist to clarify.  If you requested that your care partner not be given the details of your procedure findings, then the procedure report has been included in a sealed envelope for you to review at your convenience later.  YOU SHOULD EXPECT: Some feelings of bloating in the abdomen. Passage of more gas than usual.  Walking can help get rid of the air that was put into your GI tract during the procedure and reduce the bloating. If you had a lower endoscopy (such as a colonoscopy or flexible sigmoidoscopy) you may notice spotting of blood in your stool or on the toilet paper. If you underwent a bowel prep for your procedure, you may not have a normal bowel movement for a few days.  Please Note:  You might notice some irritation and congestion in your nose or some drainage.  This is from the oxygen used during your procedure.  There is no need for concern and it should clear up in a day or so.  SYMPTOMS TO REPORT IMMEDIATELY:  Following lower endoscopy (colonoscopy or flexible sigmoidoscopy):  Excessive amounts of blood in the stool  Significant tenderness or worsening of abdominal pains  Swelling of the abdomen that is new, acute  Fever of 100F or higher  For urgent or emergent issues, a gastroenterologist can be reached at any hour by calling [phone]. Do not use MyChart messaging for urgent concerns.    DIET:  We do recommend a small meal at first, but then you may proceed to your  regular diet.  Drink plenty of fluids but you should avoid alcoholic beverages for 24 hours.  ACTIVITY:  You should plan to take it easy for the rest of today and you should NOT DRIVE or use heavy machinery until tomorrow (because of the sedation medicines used during the test).    FOLLOW UP: Our staff will call the number listed on your records the next business day following your procedure.  We will call around 7:15- 8:00 am to check on you and address any questions or concerns that you may have regarding the information given to you following your procedure. If we do not reach you, we will leave a message.     If any biopsies were taken you will be contacted by phone or by letter within the next 1-3 weeks.  Please call us at [phone] if you have not heard about the biopsies in 3 weeks.    SIGNATURES/CONFIDENTIALITY: You and/or your care partner have signed paperwork which will be entered into your electronic medical record.  These signatures attest to the fact that that the information above on your After Visit Summary has been reviewed and is understood.  Full responsibility of the confidentiality of this discharge information lies with you and/or your care-partner.

## 2023-04-17 NOTE — Progress Notes (Signed)
Pt's states no medical or surgical changes since previsit or office visit. 

## 2023-04-17 NOTE — Progress Notes (Signed)
Called to room to assist during endoscopic procedure.  Patient ID and intended procedure confirmed with present staff. Received instructions for my participation in the procedure from the performing physician.  

## 2023-04-17 NOTE — Progress Notes (Signed)
GASTROENTEROLOGY PROCEDURE H&P NOTE   Primary Care Physician: [name], MD  HPI: Chad Morton is a 67 y.o. male who presents for Colonoscopy for surveillance of previous polyps and FHx of early Colon Cancer.  Past Medical History:  Diagnosis Date   Arthritis    Blood transfusion without reported diagnosis    at birth    Diabetes mellitus without complication (HCC)    Glaucoma    [facility]   Hyperlipidemia    Hypertension    Post-operative nausea and vomiting    Past Surgical History:  Procedure Laterality Date   CARPAL TUNNEL RELEASE Left 10/10/2018   Procedure: LEFT CARPAL TUNNEL RELEASE;  Surgeon: Kathryne Hitch, MD;  Location: [facility];  Service: Orthopedics;  Laterality: Left;   COLONOSCOPY  2010   KNEE ARTHROSCOPY W/ ACL RECONSTRUCTION Bilateral    both knees x1   MANDIBLE FRACTURE SURGERY Bilateral    TONSILECTOMY/ADENOIDECTOMY WITH MYRINGOTOMY     childhood   Current Outpatient Medications  Medication Sig Dispense Refill   Accu-Chek FastClix Lancets MISC USE TO TEST BLOOD SUGAR ONCE DAILY AS DIRECTED 102 each 4   benazepril (LOTENSIN) 40 MG tablet Take 1 tablet (40 mg total) by mouth daily. 90 tablet 2   blood glucose meter kit and supplies Dispense based on patient and insurance preference. Use daily as directed to test blood sugar (E11.9). 1 each 0   brimonidine (ALPHAGAN) 0.2 % ophthalmic solution Instill 1 drop into affected eye(s) twice daily as needed. 15 mL 1   brimonidine (ALPHAGAN) 0.2 % ophthalmic solution Instill 1 drop into affected eye(s) twice daily as directed 15 mL 2   brimonidine (ALPHAGAN) 0.2 % ophthalmic solution Place 1 drop into both eyes 2 (two) times daily as needed. 15 mL 1   dorzolamide-timolol (COSOPT) 2-0.5 % ophthalmic solution Instill 1 drop into affected eye(s) 2 times daily as directed 15 mL 2   dorzolamide-timolol (COSOPT) 2-0.5 % ophthalmic solution Place 1 drop into both eyes 2 (two)  times daily. 15 mL 1   empagliflozin (JARDIANCE) 25 MG TABS tablet Take 1 tablet (25 mg total) by mouth daily before breakfast. 30 tablet 11   glimepiride (AMARYL) 4 MG tablet Take 2 tablets (8 mg total) by mouth daily with breakfast. 180 tablet 3   glucose blood test strip Once daily dx e11.9 100 each 3   linagliptin (TRADJENTA) 5 MG TABS tablet Take 1 tablet (5 mg total) by mouth daily. 90 tablet 3   metFORMIN (GLUCOPHAGE-XR) 500 MG 24 hr tablet Take 2 tablets (1,000 mg total) by mouth 2 (two) times daily. 360 tablet 3   rosuvastatin (CRESTOR) 20 MG tablet Take 1 tablet (20 mg total) by mouth daily. 90 tablet 3   No current facility-administered medications for this visit.    Current Outpatient Medications:    Accu-Chek FastClix Lancets MISC, USE TO TEST BLOOD SUGAR ONCE DAILY AS DIRECTED, Disp: 102 each, Rfl: 4   benazepril (LOTENSIN) 40 MG tablet, Take 1 tablet (40 mg total) by mouth daily., Disp: 90 tablet, Rfl: 2   blood glucose meter kit and supplies, Dispense based on patient and insurance preference. Use daily as directed to test blood sugar (E11.9)., Disp: 1 each, Rfl: 0   brimonidine (ALPHAGAN) 0.2 % ophthalmic solution, Instill 1 drop into affected eye(s) twice daily as needed., Disp: 15 mL, Rfl: 1   brimonidine (ALPHAGAN) 0.2 % ophthalmic solution, Instill 1 drop into affected eye(s) twice daily as directed,  Disp: 15 mL, Rfl: 2   brimonidine (ALPHAGAN) 0.2 % ophthalmic solution, Place 1 drop into both eyes 2 (two) times daily as needed., Disp: 15 mL, Rfl: 1   dorzolamide-timolol (COSOPT) 2-0.5 % ophthalmic solution, Instill 1 drop into affected eye(s) 2 times daily as directed, Disp: 15 mL, Rfl: 2   dorzolamide-timolol (COSOPT) 2-0.5 % ophthalmic solution, Place 1 drop into both eyes 2 (two) times daily., Disp: 15 mL, Rfl: 1   empagliflozin (JARDIANCE) 25 MG TABS tablet, Take 1 tablet (25 mg total) by mouth daily before breakfast., Disp: 30 tablet, Rfl: 11   glimepiride (AMARYL) 4  MG tablet, Take 2 tablets (8 mg total) by mouth daily with breakfast., Disp: 180 tablet, Rfl: 3   glucose blood test strip, Once daily dx e11.9, Disp: 100 each, Rfl: 3   linagliptin (TRADJENTA) 5 MG TABS tablet, Take 1 tablet (5 mg total) by mouth daily., Disp: 90 tablet, Rfl: 3   metFORMIN (GLUCOPHAGE-XR) 500 MG 24 hr tablet, Take 2 tablets (1,000 mg total) by mouth 2 (two) times daily., Disp: 360 tablet, Rfl: 3   rosuvastatin (CRESTOR) 20 MG tablet, Take 1 tablet (20 mg total) by mouth daily., Disp: 90 tablet, Rfl: 3 Allergies  Allergen Reactions   Diphtheria Toxoid Swelling   Family History  Problem Relation Age of Onset   Myasthenia gravis Mother        complications from this   Colon cancer Father        diagnosed 63- died within a year of diagnosis   Diabetes Brother    Hyperlipidemia Brother    Colon polyps Neg Hx    Esophageal cancer Neg Hx    Rectal cancer Neg Hx    Stomach cancer Neg Hx    Social History   Socioeconomic History   Marital status: Married    Spouse name: Not on file   Number of children: Not on file   Years of education: Not on file   Highest education level: Bachelor's degree (e.g., BA, AB, BS)  Occupational History   Not on file  Tobacco Use   Smoking status: Never   Smokeless tobacco: Never  Vaping Use   Vaping status: Never Used  Substance and Sexual Activity   Alcohol use: Yes    Alcohol/week: 10.0 - 12.0 standard drinks of alcohol    Types: 10 - 12 Standard drinks or equivalent per week   Drug use: No   Sexual activity: Yes  Other Topics Concern   Not on file  Social History Narrative   Married. 3 kids. 5 grandkids       Home remodeling. Wife works for [employer] for at least 2 more years in 2021. [employer]- geography major       Hobbies: time with family, beach, football (season tickets to [employer])   Social Determinants of Health   Financial Resource Strain: Low Risk  (01/25/2023)   Overall Financial Resource Strain (CARDIA)    Difficulty of  Paying Living Expenses: Not hard at all  Food Insecurity: No Food Insecurity (01/25/2023)   Hunger Vital Sign    Worried About Running Out of Food in the Last Year: Never true    Ran Out of Food in the Last Year: Never true  Transportation Needs: No Transportation Needs (01/25/2023)   PRAPARE - Administrator, Civil Service (Medical): No    Lack of Transportation (Non-Medical): No  Physical Activity: Insufficiently Active (01/25/2023)   Exercise Vital Sign    Days  of Exercise per Week: 5 days    Minutes of Exercise per Session: 20 min  Stress: No Stress Concern Present (01/25/2023)   [employer] of Occupational Health - Occupational Stress Questionnaire    Feeling of Stress : Not at all  Social Connections: Moderately Integrated (01/25/2023)   Social Connection and Isolation Panel [NHANES]    Frequency of Communication with Friends and Family: Once a week    Frequency of Social Gatherings with Friends and Family: Three times a week    Attends Religious Services: 1 to 4 times per year    Active Member of Clubs or Organizations: No    Attends Banker Meetings: Never    Marital Status: Married  Catering manager Violence: Not At Risk (05/09/2022)   Humiliation, Afraid, Rape, and Kick questionnaire    Fear of Current or Ex-Partner: No    Emotionally Abused: No    Physically Abused: No    Sexually Abused: No    Physical Exam: There were no vitals filed for this visit. There is no height or weight on file to calculate BMI. GEN: NAD EYE: Sclerae anicteric ENT: MMM CV: Non-tachycardic GI: Soft, NT/ND NEURO:  Alert & Oriented x 3  Lab Results: No results for input(s): "WBC", "HGB", "HCT", "PLT" in the last 72 hours. BMET No results for input(s): "NA", "K", "CL", "CO2", "GLUCOSE", "BUN", "CREATININE", "CALCIUM" in the last 72 hours. LFT No results for input(s): "PROT", "ALBUMIN", "AST", "ALT", "ALKPHOS", "BILITOT", "BILIDIR", "IBILI" in the last 72  hours. PT/INR No results for input(s): "LABPROT", "INR" in the last 72 hours.   Impression / Plan: This is a 67 y.o.male who presents for Colonoscopy for surveillance of previous polyps and FHx of early Colon Cancer.  The risks and benefits of endoscopic evaluation/treatment were discussed with the patient and/or family; these include but are not limited to the risk of perforation, infection, bleeding, missed lesions, lack of diagnosis, severe illness requiring hospitalization, as well as anesthesia and sedation related illnesses.  The patient's history has been reviewed, patient examined, no change in status, and deemed stable for procedure.  The patient and/or family is agreeable to proceed.    Corliss Parish, MD [facility]oscopy Office # [phone]

## 2023-04-17 NOTE — Progress Notes (Signed)
Uneventful anesthetic. Report to pacu rn. Vss. Care resumed by rn. 

## 2023-04-17 NOTE — Op Note (Signed)
[facility] Patient Name: Chad Morton Procedure Date: 04/17/2023 8:32 AM MRN: [id number] Endoscopist: Corliss Parish , MD, [id number] Age: 67 Referring MD:  Date of Birth: September 17, 1956 Gender: Male Account #: [id number] Procedure:                Colonoscopy Indications:              High risk colon cancer surveillance: Personal                            history of colonic polyps, Family history of colon                            cancer in a first-degree relative before age 7                            years Medicines:                Monitored Anesthesia Care Procedure:                Pre-Anesthesia Assessment:                           - Prior to the procedure, a History and Physical                            was performed, and patient medications and                            allergies were reviewed. The patient's tolerance of                            previous anesthesia was also reviewed. The risks                            and benefits of the procedure and the sedation                            options and risks were discussed with the patient.                            All questions were answered, and informed consent                            was obtained. Prior Anticoagulants: The patient has                            taken no anticoagulant or antiplatelet agents. ASA                            Grade Assessment: II - A patient with mild systemic                            disease. After reviewing the risks and benefits,  the patient was deemed in satisfactory condition to                            undergo the procedure.                           After obtaining informed consent, the colonoscope                            was passed under direct vision. Throughout the                            procedure, the patient's blood pressure, pulse, and                            oxygen saturations were monitored continuously. The                             CF [id number] #[id number] was introduced through the anus                            and advanced to the 3 cm into the ileum. The                            colonoscopy was performed without difficulty. The                            patient tolerated the procedure. The quality of the                            bowel preparation was good. The terminal ileum,                            ileocecal valve, appendiceal orifice, and rectum                            were photographed. Scope In: 8:54:06 AM Scope Out: 9:08:48 AM Scope Withdrawal Time: 0 hours 10 minutes 53 seconds  Total Procedure Duration: 0 hours 14 minutes 42 seconds  Findings:                 The digital rectal exam findings include                            hemorrhoids. Pertinent negatives include no                            palpable rectal lesions.                           The terminal ileum and ileocecal valve appeared                            normal.  A 4 mm polyp was found in the transverse colon. The                            polyp was sessile. The polyp was removed with a                            cold snare. Resection and retrieval were complete.                           Many medium-mouthed and small-mouthed diverticula                            were found in the entire colon.                           Normal mucosa was found in the entire colon                            otherwise.                           Non-bleeding non-thrombosed internal hemorrhoids                            were found during retroflexion, during perianal                            exam and during digital exam. The hemorrhoids were                            Grade II (internal hemorrhoids that prolapse but                            reduce spontaneously). Complications:            No immediate complications. Estimated Blood Loss:     Estimated blood loss was minimal. Impression:               -  Hemorrhoids found on digital rectal exam.                           - The examined portion of the ileum was normal.                           - One 4 mm polyp in the transverse colon, removed                            with a cold snare. Resected and retrieved.                           - Diverticulosis in the entire examined colon.                           - Normal mucosa in the entire examined colon  otherwise.                           - Non-bleeding non-thrombosed internal hemorrhoids. Recommendation:           - The patient will be observed post-procedure,                            until all discharge criteria are met.                           - Discharge patient to home.                           - Patient has a contact number available for                            emergencies. The signs and symptoms of potential                            delayed complications were discussed with the                            patient. Return to normal activities tomorrow.                            Written discharge instructions were provided to the                            patient.                           - High fiber diet.                           - Use FiberCon 1-2 tablets PO daily.                           - Continue present medications.                           - Await pathology results.                           - Repeat colonoscopy in 5 years for surveillance                            (no matter pathology due to family history of early                            colon cancer and previous history of colon polyps).                           - The findings and recommendations were discussed                            with the  patient.                           - The findings and recommendations were discussed                            with the patient's family. Corliss Parish, MD 04/17/2023 9:12:50 AM

## 2023-04-18 ENCOUNTER — Telehealth: Payer: Self-pay | Admitting: *Deleted

## 2023-04-18 ENCOUNTER — Other Ambulatory Visit (HOSPITAL_BASED_OUTPATIENT_CLINIC_OR_DEPARTMENT_OTHER): Payer: Self-pay

## 2023-04-18 ENCOUNTER — Telehealth: Payer: Self-pay | Admitting: Family Medicine

## 2023-04-18 MED ORDER — ACCU-CHEK FASTCLIX LANCETS MISC
4 refills | Status: AC
  Filled 2023-04-18: qty 102, 90d supply, fill #0

## 2023-04-18 NOTE — Telephone Encounter (Signed)
  Follow up Call-     04/17/2023    8:37 AM  Call back number  Post procedure Call Back phone  # [phone]  Permission to leave phone message Yes     Patient questions:  Do you have a fever, pain , or abdominal swelling? No. Pain Score  0 *  Have you tolerated food without any problems? Yes.    Have you been able to return to your normal activities? Yes.    Do you have any questions about your discharge instructions: Diet   No. Medications  No. Follow up visit  No.  Do you have questions or concerns about your Care? No.  Actions: * If pain score is 4 or above: No action needed, pain <4.

## 2023-04-18 NOTE — Telephone Encounter (Signed)
Refill sent to requested pharmacy.

## 2023-04-18 NOTE — Telephone Encounter (Signed)
..  Prescription Request  04/18/2023  LOV: 02/16/2023  What is the name of the medication or equipment? Accu-Chek FastClix Lancets MISC   glucose blood test strip   Have you contacted your pharmacy to request a refill? Yes   Which pharmacy would you like this sent to?   [facility] [address] [place] [address] Phone: [phone] Fax: [phone]    Patient notified that their request is being sent to the clinical staff for review and that they should receive a response within 2 business days.   Please advise at Mobile [phone] (mobile)

## 2023-04-19 ENCOUNTER — Other Ambulatory Visit (HOSPITAL_BASED_OUTPATIENT_CLINIC_OR_DEPARTMENT_OTHER): Payer: Self-pay

## 2023-04-20 ENCOUNTER — Other Ambulatory Visit: Payer: Self-pay | Admitting: Family Medicine

## 2023-04-20 ENCOUNTER — Other Ambulatory Visit (HOSPITAL_BASED_OUTPATIENT_CLINIC_OR_DEPARTMENT_OTHER): Payer: Self-pay

## 2023-04-20 ENCOUNTER — Other Ambulatory Visit (HOSPITAL_COMMUNITY): Payer: Self-pay

## 2023-04-20 ENCOUNTER — Other Ambulatory Visit: Payer: Self-pay

## 2023-04-20 MED ORDER — ONETOUCH DELICA LANCETS 33G MISC
1.0000 | Freq: Three times a day (TID) | 0 refills | Status: DC
  Filled 2023-04-20: qty 100, 33d supply, fill #0
  Filled 2023-04-23: qty 100, 30d supply, fill #0

## 2023-04-20 MED ORDER — LANCET DEVICE MISC
1.0000 | Freq: Three times a day (TID) | 0 refills | Status: AC
  Filled 2023-04-20: qty 1, 30d supply, fill #0

## 2023-04-20 MED ORDER — GLUCOSE BLOOD VI STRP
ORAL_STRIP | 3 refills | Status: AC
  Filled 2023-04-20: qty 100, 30d supply, fill #0
  Filled 2023-04-20: qty 100, 100d supply, fill #0
  Filled 2023-04-21: qty 100, 33d supply, fill #0
  Filled 2023-04-23: qty 100, 30d supply, fill #0
  Filled 2023-06-04: qty 100, 30d supply, fill #1
  Filled 2023-08-07: qty 100, 30d supply, fill #2

## 2023-04-20 MED ORDER — BLOOD GLUCOSE MONITOR SYSTEM W/DEVICE KIT
1.0000 | PACK | Freq: Three times a day (TID) | 0 refills | Status: AC
  Filled 2023-04-20: qty 1, 1d supply, fill #0
  Filled 2023-04-23: qty 1, 30d supply, fill #0

## 2023-04-21 ENCOUNTER — Other Ambulatory Visit (HOSPITAL_COMMUNITY): Payer: Self-pay

## 2023-04-23 ENCOUNTER — Encounter: Payer: Self-pay | Admitting: Gastroenterology

## 2023-04-23 ENCOUNTER — Other Ambulatory Visit (HOSPITAL_COMMUNITY): Payer: Self-pay

## 2023-04-23 ENCOUNTER — Other Ambulatory Visit (HOSPITAL_BASED_OUTPATIENT_CLINIC_OR_DEPARTMENT_OTHER): Payer: Self-pay

## 2023-04-25 ENCOUNTER — Encounter (INDEPENDENT_AMBULATORY_CARE_PROVIDER_SITE_OTHER): Payer: Self-pay

## 2023-05-10 ENCOUNTER — Encounter (INDEPENDENT_AMBULATORY_CARE_PROVIDER_SITE_OTHER): Payer: Self-pay

## 2023-05-14 ENCOUNTER — Other Ambulatory Visit: Payer: Self-pay | Admitting: Oncology

## 2023-05-14 DIAGNOSIS — Z006 Encounter for examination for normal comparison and control in clinical research program: Secondary | ICD-10-CM

## 2023-05-17 ENCOUNTER — Ambulatory Visit (INDEPENDENT_AMBULATORY_CARE_PROVIDER_SITE_OTHER): Payer: Medicare HMO

## 2023-05-17 VITALS — Wt 170.0 lb

## 2023-05-17 DIAGNOSIS — Z0000 Encounter for general adult medical examination without abnormal findings: Secondary | ICD-10-CM

## 2023-05-17 NOTE — Progress Notes (Signed)
Subjective:   Chad Morton is a 67 y.o. male who presents for Medicare Annual/Subsequent preventive examination.  Visit Complete: Virtual  I connected with  Chad Morton on 05/17/23 by a audio enabled telemedicine application and verified that I am speaking with the correct person using two identifiers.  Patient Location: Home  Provider Location: Office/Clinic  I discussed the limitations of evaluation and management by telemedicine. The patient expressed understanding and agreed to proceed.  Patient Medicare AWV questionnaire was completed by the patient on 05/14/23; I have confirmed that all information answered by patient is correct and no changes since this date.   Vital Signs: Unable to obtain new vitals due to this being a telehealth visit.   Review of Systems     Cardiac Risk Factors include: advanced age (>65men, >17 women);dyslipidemia;male gender;hypertension;diabetes mellitus     Objective:    Today's Vitals   05/17/23 0831  Weight: 170 lb (77.1 kg)   Body mass index is 25.85 kg/m.     05/17/2023    8:35 AM 03/03/2023    6:30 PM 05/09/2022    8:38 AM 10/10/2018    6:23 AM 10/03/2018   11:06 AM  Advanced Directives  Does Patient Have a Medical Advance Directive? Yes No Yes Yes Yes  Type of Estate agent of [place];Living will  Healthcare Power of [place];Living will Healthcare Power of [place];Living will   Does patient want to make changes to medical advance directive?    No - Patient declined   Copy of Healthcare Power of Attorney in Chart? No - copy requested  No - copy requested No - copy requested     Current Medications (verified) Outpatient Encounter Medications as of 05/17/2023  Medication Sig   Accu-Chek FastClix Lancets MISC USE TO TEST BLOOD SUGAR ONCE DAILY. Dx: E11.9   benazepril (LOTENSIN) 40 MG tablet Take 1 tablet (40 mg total) by mouth daily.   Blood Glucose Monitoring Suppl (BLOOD GLUCOSE MONITOR SYSTEM) w/Device KIT  Test in the morning, at noon, and at bedtime.   brimonidine (ALPHAGAN) 0.2 % ophthalmic solution Place 1 drop into both eyes 2 (two) times daily as needed.   dorzolamide-timolol (COSOPT) 2-0.5 % ophthalmic solution Instill 1 drop into affected eye(s) 2 times daily as directed   empagliflozin (JARDIANCE) 25 MG TABS tablet Take 1 tablet (25 mg total) by mouth daily before breakfast.   glimepiride (AMARYL) 4 MG tablet Take 2 tablets (8 mg total) by mouth daily with breakfast.   glucose blood test strip use as directed to test in the morning, evening , and night   Lancet Device MISC Test in the morning, at noon, and at bedtime. May substitute to any manufacturer covered by patient's insurance.   linagliptin (TRADJENTA) 5 MG TABS tablet Take 1 tablet (5 mg total) by mouth daily.   metFORMIN (GLUCOPHAGE-XR) 500 MG 24 hr tablet Take 2 tablets (1,000 mg total) by mouth 2 (two) times daily.   OneTouch Delica Lancets 33G MISC Test in the morning, at noon, and at bedtime.   rosuvastatin (CRESTOR) 20 MG tablet Take 1 tablet (20 mg total) by mouth daily.   brimonidine (ALPHAGAN) 0.2 % ophthalmic solution Instill 1 drop into affected eye(s) twice daily as directed   dorzolamide-timolol (COSOPT) 2-0.5 % ophthalmic solution Place 1 drop into both eyes 2 (two) times daily.   [DISCONTINUED] brimonidine (ALPHAGAN) 0.2 % ophthalmic solution Instill 1 drop into affected eye(s) twice daily as needed.   No facility-administered encounter  medications on file as of 05/17/2023.    Allergies (verified) Diphtheria toxoid   History: Past Medical History:  Diagnosis Date   Arthritis    Blood transfusion without reported diagnosis    at birth    Diabetes mellitus without complication (HCC)    Glaucoma    [facility]   Hyperlipidemia    Hypertension    Post-operative nausea and vomiting    Past Surgical History:  Procedure Laterality Date   CARPAL TUNNEL RELEASE Left 10/10/2018   Procedure: LEFT CARPAL  TUNNEL RELEASE;  Surgeon: Kathryne Hitch, MD;  Location: [facility];  Service: Orthopedics;  Laterality: Left;   COLONOSCOPY  2010   KNEE ARTHROSCOPY W/ ACL RECONSTRUCTION Bilateral    both knees x1   MANDIBLE FRACTURE SURGERY Bilateral    TONSILECTOMY/ADENOIDECTOMY WITH MYRINGOTOMY     childhood   Family History  Problem Relation Age of Onset   Myasthenia gravis Mother        complications from this   Colon cancer Father        diagnosed 5- died within a year of diagnosis   Diabetes Brother    Hyperlipidemia Brother    Colon polyps Neg Hx    Esophageal cancer Neg Hx    Rectal cancer Neg Hx    Stomach cancer Neg Hx    Social History   Socioeconomic History   Marital status: Married    Spouse name: Not on file   Number of children: Not on file   Years of education: Not on file   Highest education level: Bachelor's degree (e.g., BA, AB, BS)  Occupational History   Not on file  Tobacco Use   Smoking status: Never   Smokeless tobacco: Never  Vaping Use   Vaping status: Never Used  Substance and Sexual Activity   Alcohol use: Yes    Alcohol/week: 10.0 - 12.0 standard drinks of alcohol    Types: 10 - 12 Standard drinks or equivalent per week   Drug use: No   Sexual activity: Yes  Other Topics Concern   Not on file  Social History Narrative   Married. 3 kids. 5 grandkids       Home remodeling. Wife works for [employer] for at least 2 more years in 2021. [employer]- geography major       Hobbies: time with family, beach, football (season tickets to [employer])   Social Determinants of Health   Financial Resource Strain: Low Risk  (05/14/2023)   Overall Financial Resource Strain (CARDIA)    Difficulty of Paying Living Expenses: Not hard at all  Food Insecurity: No Food Insecurity (05/14/2023)   Hunger Vital Sign    Worried About Running Out of Food in the Last Year: Never true    Ran Out of Food in the Last Year: Never true  Transportation Needs: No  Transportation Needs (05/14/2023)   PRAPARE - Administrator, Civil Service (Medical): No    Lack of Transportation (Non-Medical): No  Physical Activity: Insufficiently Active (05/14/2023)   Exercise Vital Sign    Days of Exercise per Week: 5 days    Minutes of Exercise per Session: 20 min  Stress: No Stress Concern Present (05/14/2023)   [employer] of Occupational Health - Occupational Stress Questionnaire    Feeling of Stress : Not at all  Social Connections: Moderately Integrated (05/14/2023)   Social Connection and Isolation Panel [NHANES]    Frequency of Communication with Friends and Family: Twice a  week    Frequency of Social Gatherings with Friends and Family: Three times a week    Attends Religious Services: 1 to 4 times per year    Active Member of Clubs or Organizations: No    Attends Banker Meetings: Never    Marital Status: Married    Tobacco Counseling Counseling given: Not Answered   Clinical Intake:  Pre-visit preparation completed: Yes  Pain : No/denies pain     BMI - recorded: 25.85 Nutritional Status: BMI 25 -29 Overweight Nutritional Risks: None Diabetes: Yes CBG done?: Yes (per pt 140) CBG resulted in Enter/ Edit results?: No Did pt. bring in CBG monitor from home?: No  How often do you need to have someone help you when you read instructions, pamphlets, or other written materials from your doctor or pharmacy?: 1 - Never  Interpreter Needed?: No  Comments: Lanier Ensign, LPN   Activities of Daily Living    05/14/2023    7:43 AM  In your present state of health, do you have any difficulty performing the following activities:  Hearing? 0  Vision? 0  Difficulty concentrating or making decisions? 0  Walking or climbing stairs? 0  Dressing or bathing? 0  Doing errands, shopping? 0  Preparing Food and eating ? N  Using the Toilet? N  In the past six months, have you accidently leaked urine? N  Do you have  problems with loss of bowel control? N  Managing your Medications? N  Managing your Finances? N  Housekeeping or managing your Housekeeping? N    Patient Care Team: [name], MD as PCP - General (Family Medicine) Dr. Canary Brim as Consulting Physician (Ophthalmology) [facility] as Consulting Physician (Orthopedic Surgery) Dr. Arlyce Dice as Consulting Physician (Dentistry)  Indicate any recent Medical Services you may have received from other than [facility] providers in the past year (date may be approximate).     Assessment:   This is a routine wellness examination for Flavio.  Hearing/Vision screen Hearing Screening - Comments:: Pt denies any hearing issues  Vision Screening - Comments:: Pt follows up with Dr Corky Mull for annual eye exams   Dietary issues and exercise activities discussed:     Goals Addressed             This Visit's Progress    Patient Stated       Continue to stay healthy        Depression Screen    05/17/2023    8:35 AM 01/31/2023    7:57 AM 05/09/2022    8:37 AM 05/24/2021    1:02 PM 11/25/2019    2:55 PM 08/14/2018   10:00 AM 10/09/2017    9:23 AM  PHQ 2/9 Scores  PHQ - 2 Score 0 0 0 0 0 0 0  PHQ- 9 Score  0   0      Fall Risk    05/14/2023    7:43 AM 01/31/2023    7:57 AM 05/09/2022    8:39 AM 05/24/2021    1:01 PM 05/20/2020    4:35 PM  Fall Risk   Falls in the past year? 1 0 0 0 0  Number falls in past yr: 0 0 0 0 0  Injury with Fall? 1 0 0 0 0  Comment fell off ladder      Risk for fall due to : Impaired vision No Fall Risks Impaired vision No Fall Risks   Follow up Falls prevention discussed Falls  evaluation completed Falls prevention discussed Falls evaluation completed     MEDICARE RISK AT HOME: Medicare Risk at Home Any stairs in or around the home?: Yes If so, are there any without handrails?: Yes Home free of loose throw rugs in walkways, pet beds, electrical cords, etc?: Yes Adequate lighting in your home to  reduce risk of falls?: Yes Life alert?: No Use of a cane, walker or w/c?: No Grab bars in the bathroom?: No Shower chair or bench in shower?: Yes Elevated toilet seat or a handicapped toilet?: Yes  TIMED UP AND GO:  Was the test performed?  No    Cognitive Function:        05/17/2023    8:37 AM 05/09/2022    8:49 AM  6CIT Screen  What Year? 0 points 0 points  What month? 0 points 0 points  What time? 0 points 0 points  Count back from 20 0 points 0 points  Months in reverse 0 points 0 points  Repeat phrase 0 points 0 points  Total Score 0 points 0 points    Immunizations Immunization History  Administered Date(s) Administered   PFIZER(Purple Top)SARS-COV-2 Vaccination 12/11/2019, 01/05/2020   Pneumococcal Polysaccharide-23 10/09/2017   Zoster Recombinant(Shingrix) 11/25/2019, 05/24/2021      Flu Vaccine status: Due, Education has been provided regarding the importance of this vaccine. Advised may receive this vaccine at local pharmacy or Health Dept. Aware to provide a copy of the vaccination record if obtained from local pharmacy or Health Dept. Verbalized acceptance and understanding.  Pneumococcal vaccine status: Due, Education has been provided regarding the importance of this vaccine. Advised may receive this vaccine at local pharmacy or Health Dept. Aware to provide a copy of the vaccination record if obtained from local pharmacy or Health Dept. Verbalized acceptance and understanding.  Covid-19 vaccine status: Declined, Education has been provided regarding the importance of this vaccine but patient still declined. Advised may receive this vaccine at local pharmacy or Health Dept.or vaccine clinic. Aware to provide a copy of the vaccination record if obtained from local pharmacy or Health Dept. Verbalized acceptance and understanding.  Qualifies for Shingles Vaccine? Yes   Zostavax completed Yes   Shingrix Completed?: Yes  Screening Tests Health Maintenance   Topic Date Due   COVID-19 Vaccine (3 - Pfizer risk series) 02/02/2020   OPHTHALMOLOGY EXAM  04/13/2023   INFLUENZA VACCINE  04/26/2023   Pneumonia Vaccine 32+ Years old (2 of 2 - PCV) 01/31/2024 (Originally 04/18/2021)   HEMOGLOBIN A1C  08/03/2023   Diabetic kidney evaluation - Urine ACR  10/03/2023   FOOT EXAM  10/03/2023   Diabetic kidney evaluation - eGFR measurement  01/31/2024   Medicare Annual Wellness (AWV)  05/16/2024   Colonoscopy  04/16/2028   Hepatitis C Screening  Completed   Zoster Vaccines- Shingrix  Completed   HPV VACCINES  Aged Out   DTaP/Tdap/Td  Discontinued    Health Maintenance  Health Maintenance Due  Topic Date Due   COVID-19 Vaccine (3 - Pfizer risk series) 02/02/2020   OPHTHALMOLOGY EXAM  04/13/2023   INFLUENZA VACCINE  04/26/2023    Colorectal cancer screening: Type of screening: Colonoscopy. Completed 04/17/23. Repeat every 5 years  Additional Screening:  Hepatitis C Screening:  Completed 10/09/17  Vision Screening: Recommended annual ophthalmology exams for early detection of glaucoma and other disorders of the eye. Is the patient up to date with their annual eye exam?  Yes  Who is the provider or what is the name  of the office in which the patient attends annual eye exams? Dr Cherlynn Polo  If pt is not established with a provider, would they like to be referred to a provider to establish care? No .   Dental Screening: Recommended annual dental exams for proper oral hygiene  Diabetic Foot Exam: Diabetic Foot Exam: Completed 10/02/22  Community Resource Referral / Chronic Care Management: CRR required this visit?  No   CCM required this visit?  No     Plan:     I have personally reviewed and noted the following in the patient's chart:   Medical and social history Use of alcohol, tobacco or illicit drugs  Current medications and supplements including opioid prescriptions. Patient is not currently taking opioid prescriptions. Functional ability  and status Nutritional status Physical activity Advanced directives List of other physicians Hospitalizations, surgeries, and ER visits in previous 12 months Vitals Screenings to include cognitive, depression, and falls Referrals and appointments  In addition, I have reviewed and discussed with patient certain preventive protocols, quality metrics, and best practice recommendations. A written personalized care plan for preventive services as well as general preventive health recommendations were provided to patient.     Marzella Schlein, LPN   [id number]   After Visit Summary: (MyChart) Due to this being a telephonic visit, the after visit summary with patients personalized plan was offered to patient via MyChart   Nurse Notes: none

## 2023-05-17 NOTE — Patient Instructions (Signed)
Chad Morton , Thank you for taking time to come for your Medicare Wellness Visit. I appreciate your ongoing commitment to your health goals. Please review the following plan we discussed and let me know if I can assist you in the future.   Referrals/Orders/Follow-Ups/Clinician Recommendations: continue to stay healthy   This is a list of the screening recommended for you and due dates:  Health Maintenance  Topic Date Due   COVID-19 Vaccine (3 - Pfizer risk series) 02/02/2020   Eye exam for diabetics  04/13/2023   Medicare Annual Wellness Visit  05/10/2023   Flu Shot  04/26/2023   Pneumonia Vaccine (2 of 2 - PCV) 01/31/2024*   Hemoglobin A1C  08/03/2023   Yearly kidney health urinalysis for diabetes  10/03/2023   Complete foot exam   10/03/2023   Yearly kidney function blood test for diabetes  01/31/2024   Colon Cancer Screening  04/16/2028   Hepatitis C Screening  Completed   Zoster (Shingles) Vaccine  Completed   HPV Vaccine  Aged Out   DTaP/Tdap/Td vaccine  Discontinued  *Topic was postponed. The date shown is not the original due date.    Advanced directives: (Copy Requested) Please bring a copy of your health care power of attorney and living will to the office to be added to your chart at your convenience.  Next Medicare Annual Wellness Visit scheduled for next year: Yes

## 2023-05-22 DIAGNOSIS — H401132 Primary open-angle glaucoma, bilateral, moderate stage: Secondary | ICD-10-CM | POA: Diagnosis not present

## 2023-05-22 DIAGNOSIS — E119 Type 2 diabetes mellitus without complications: Secondary | ICD-10-CM | POA: Diagnosis not present

## 2023-05-22 LAB — HM DIABETES EYE EXAM

## 2023-05-29 ENCOUNTER — Other Ambulatory Visit (HOSPITAL_BASED_OUTPATIENT_CLINIC_OR_DEPARTMENT_OTHER): Payer: Self-pay

## 2023-05-31 ENCOUNTER — Emergency Department (HOSPITAL_BASED_OUTPATIENT_CLINIC_OR_DEPARTMENT_OTHER)
Admission: EM | Admit: 2023-05-31 | Discharge: 2023-05-31 | Disposition: A | Discharge status: Home / Self Care | Payer: Medicare HMO | Attending: Emergency Medicine | Admitting: Emergency Medicine

## 2023-05-31 ENCOUNTER — Encounter (HOSPITAL_BASED_OUTPATIENT_CLINIC_OR_DEPARTMENT_OTHER): Payer: Self-pay | Admitting: Emergency Medicine

## 2023-05-31 ENCOUNTER — Emergency Department (HOSPITAL_BASED_OUTPATIENT_CLINIC_OR_DEPARTMENT_OTHER): Payer: Medicare HMO

## 2023-05-31 DIAGNOSIS — X58XXXA Exposure to other specified factors, initial encounter: Secondary | ICD-10-CM | POA: Insufficient documentation

## 2023-05-31 DIAGNOSIS — E162 Hypoglycemia, unspecified: Secondary | ICD-10-CM

## 2023-05-31 DIAGNOSIS — D72829 Elevated white blood cell count, unspecified: Secondary | ICD-10-CM | POA: Diagnosis not present

## 2023-05-31 DIAGNOSIS — R55 Syncope and collapse: Secondary | ICD-10-CM | POA: Diagnosis not present

## 2023-05-31 DIAGNOSIS — E11649 Type 2 diabetes mellitus with hypoglycemia without coma: Secondary | ICD-10-CM | POA: Diagnosis not present

## 2023-05-31 DIAGNOSIS — S0003XA Contusion of scalp, initial encounter: Secondary | ICD-10-CM | POA: Diagnosis not present

## 2023-05-31 DIAGNOSIS — Z7984 Long term (current) use of oral hypoglycemic drugs: Secondary | ICD-10-CM | POA: Insufficient documentation

## 2023-05-31 DIAGNOSIS — Z79899 Other long term (current) drug therapy: Secondary | ICD-10-CM | POA: Diagnosis not present

## 2023-05-31 DIAGNOSIS — I10 Essential (primary) hypertension: Secondary | ICD-10-CM | POA: Insufficient documentation

## 2023-05-31 DIAGNOSIS — W19XXXA Unspecified fall, initial encounter: Secondary | ICD-10-CM | POA: Diagnosis not present

## 2023-05-31 DIAGNOSIS — S0990XA Unspecified injury of head, initial encounter: Secondary | ICD-10-CM | POA: Diagnosis not present

## 2023-05-31 DIAGNOSIS — Z743 Need for continuous supervision: Secondary | ICD-10-CM | POA: Diagnosis not present

## 2023-05-31 LAB — COMPREHENSIVE METABOLIC PANEL
ALT: 15 U/L (ref 0–44)
AST: 15 U/L (ref 15–41)
Albumin: 4.6 g/dL (ref 3.5–5.0)
Alkaline Phosphatase: 46 U/L (ref 38–126)
Anion gap: 10 (ref 5–15)
BUN: 26 mg/dL — ABNORMAL HIGH (ref 8–23)
CO2: 25 mmol/L (ref 22–32)
Calcium: 9.3 mg/dL (ref 8.9–10.3)
Chloride: 100 mmol/L (ref 98–111)
Creatinine, Ser: 1.11 mg/dL (ref 0.61–1.24)
GFR, Estimated: >60 mL/min (ref >60)
Glucose, Bld: 148 mg/dL — ABNORMAL HIGH (ref 70–99)
Potassium: 4.1 mmol/L (ref 3.5–5.1)
Sodium: 135 mmol/L (ref 135–145)
Total Bilirubin: 0.6 mg/dL (ref 0.3–1.2)
Total Protein: 7.2 g/dL (ref 6.5–8.1)

## 2023-05-31 LAB — CBC WITH DIFFERENTIAL/PLATELET
Abs Immature Granulocytes: 0.12 10*3/uL — ABNORMAL HIGH (ref 0.00–0.07)
Basophils Absolute: 0.1 10*3/uL (ref 0.0–0.1)
Basophils Relative: 1 %
Eosinophils Absolute: 0.4 10*3/uL (ref 0.0–0.5)
Eosinophils Relative: 2 %
HCT: 43.8 % (ref 39.0–52.0)
Hemoglobin: 14.8 g/dL (ref 13.0–17.0)
Immature Granulocytes: 1 %
Lymphocytes Relative: 15 %
Lymphs Abs: 2.3 10*3/uL (ref 0.7–4.0)
MCH: 30.3 pg (ref 26.0–34.0)
MCHC: 33.8 g/dL (ref 30.0–36.0)
MCV: 89.8 fL (ref 80.0–100.0)
Monocytes Absolute: 0.8 10*3/uL (ref 0.1–1.0)
Monocytes Relative: 5 %
Neutro Abs: 11.6 10*3/uL — ABNORMAL HIGH (ref 1.7–7.7)
Neutrophils Relative %: 76 %
Platelets: 239 10*3/uL (ref 150–400)
RBC: 4.88 MIL/uL (ref 4.22–5.81)
RDW: 12.5 % (ref 11.5–15.5)
WBC: 15.2 10*3/uL — ABNORMAL HIGH (ref 4.0–10.5)
nRBC: 0 % (ref 0.0–0.2)

## 2023-05-31 NOTE — ED Triage Notes (Signed)
Syncopal, fall at [facility] [employer] [employer]. Fall back hit back of head. Felt fall was caused by blood glucose. EMS CBG 88 then 118 after juice. +loc. Ems VS 122/79 79hr 97hr

## 2023-05-31 NOTE — ED Provider Notes (Signed)
Chad Morton   CSN: [id number] Arrival date & time: 05/31/23  1846     History  Chief Complaint  Patient presents with   Fall   Loss of Consciousness    Chad Morton is a 67 y.o. male.  Patient is a 67 year old male with a history of hypertension, hyperlipidemia, diabetes who is presenting today with complaints of a syncopal event and injury to the back of his head.  Patient reports that he felt fine all day and they were going to a restaurant and he reports feeling a little off but the next thing he remembers is waking up on the floor.  When waking up on the floor he remembers not feeling well.  He was pale per his wife but she reports that she does not think he ever fully lost consciousness because he was answering questions very soon when she got to him.  He did hit his head on a hard floor and was bleeding.  He was given several cups of juice and reports after that he started feeling better and after he had had the juice they checked his blood sugar and it was 80.  He was concerned that he had hypoglycemic event that caused this today.  He does not have a headache or neck pain at this time.  He denies any chest pain, palpitations, shortness of breath before or after the event.  No abdominal pain or vomiting.  He denies ever having a history of similar symptoms.  No known heart disease.  No recent medication changes.  He had a lunch this morning.  When he woke up this morning his blood sugar was 140 and before lunch it was 110.  The history is provided by the patient.  Fall  Loss of Consciousness      Home Medications Prior to Admission medications   Medication Sig Start Date End Date Taking? Authorizing Provider  Accu-Chek FastClix Lancets MISC USE TO TEST BLOOD SUGAR ONCE DAILY. Dx: E11.9 04/18/23   [name], MD  benazepril (LOTENSIN) 40 MG tablet Take 1 tablet (40 mg total) by mouth daily. 01/24/23   [name], MD  Blood Glucose Monitoring Suppl (BLOOD GLUCOSE MONITOR SYSTEM) w/Device KIT Test in the morning, at noon, and at bedtime. 04/20/23   [name], MD  brimonidine [facility]) 0.2 % ophthalmic solution Instill 1 drop into affected eye(s) twice daily as directed 03/26/23     brimonidine (ALPHAGAN) 0.2 % ophthalmic solution Place 1 drop into both eyes 2 (two) times daily as needed. 03/26/23     dorzolamide-timolol (COSOPT) 2-0.5 % ophthalmic solution Instill 1 drop into affected eye(s) 2 times daily as directed 01/03/23     dorzolamide-timolol (COSOPT) 2-0.5 % ophthalmic solution Place 1 drop into both eyes 2 (two) times daily. 03/26/23     empagliflozin (JARDIANCE) 25 MG TABS tablet Take 1 tablet (25 mg total) by mouth daily before breakfast. 11/10/22 11/10/23  [name], MD  glimepiride (AMARYL) 4 MG tablet Take 2 tablets (8 mg total) by mouth daily with breakfast. 02/01/23   [name], MD  glucose blood test strip use as directed to test in the morning, evening , and night 04/20/23   [name], MD  linagliptin (TRADJENTA) 5 MG TABS tablet Take 1 tablet (5 mg total) by mouth daily. 11/10/22   [name], MD  metFORMIN (GLUCOPHAGE-XR) 500 MG 24 hr tablet Take 2 tablets (1,000  mg total) by mouth 2 (two) times daily. 07/19/22 07/19/23  [name], MD  rosuvastatin (CRESTOR) 20 MG tablet Take 1 tablet (20 mg total) by mouth daily. 12/08/22   [name], MD      Allergies    Diphtheria toxoid    Review of Systems   Review of Systems  Cardiovascular:  Positive for syncope.    Physical Exam Updated Vital Signs BP (!) 119/96   Pulse 96   Temp 97.8 F (36.6 C) (Oral)   Resp (!) 22   SpO2 98%  Physical Exam Vitals and nursing Morton reviewed.  Constitutional:      General: He is not in acute distress.    Appearance: He is well-developed.  HENT:     Head: Normocephalic.   Eyes:     Conjunctiva/sclera: Conjunctivae normal.     Pupils: Pupils are equal,  round, and reactive to light.  Cardiovascular:     Rate and Rhythm: Normal rate and regular rhythm.     Heart sounds: No murmur heard. Pulmonary:     Effort: Pulmonary effort is normal. No respiratory distress.     Breath sounds: Normal breath sounds. No wheezing or rales.  Abdominal:     General: There is no distension.     Palpations: Abdomen is soft.     Tenderness: There is no abdominal tenderness. There is no guarding or rebound.  Musculoskeletal:        General: No tenderness. Normal range of motion.     Cervical back: Normal range of motion and neck supple. No spinous process tenderness or muscular tenderness.  Skin:    General: Skin is warm and dry.     Findings: No erythema or rash.  Neurological:     Mental Status: He is alert and oriented to person, place, and time. Mental status is at baseline.  Psychiatric:        Behavior: Behavior normal.     ED Results / Procedures / Treatments   Labs (all labs ordered are listed, but only abnormal results are displayed) Labs Reviewed  CBC WITH DIFFERENTIAL/PLATELET - Abnormal; Notable for the following components:      Result Value   WBC 15.2 (*)    Neutro Abs 11.6 (*)    Abs Immature Granulocytes 0.12 (*)    All other components within normal limits  COMPREHENSIVE METABOLIC PANEL - Abnormal; Notable for the following components:   Glucose, Bld 148 (*)    BUN 26 (*)    All other components within normal limits    EKG None  Radiology CT Head Wo Contrast  Result Date: 05/31/2023 CLINICAL DATA:  Syncopal episode, fall, hit back of head. Loss of consciousness. EXAM: CT HEAD WITHOUT CONTRAST TECHNIQUE: Contiguous axial images were obtained from the base of the skull through the vertex without intravenous contrast. RADIATION DOSE REDUCTION: This exam was performed according to the departmental dose-optimization program which includes automated exposure control, adjustment of the mA and/or kV according to patient size and/or use  of iterative reconstruction technique. COMPARISON:  CT head 03/03/2023 FINDINGS: Brain: No intracranial hemorrhage, mass effect, or evidence of acute infarct. No hydrocephalus. No extra-axial fluid collection. Vascular: No hyperdense vessel or unexpected calcification. Skull: No fracture or focal lesion. Right posterior scalp hematoma/contusion. Sinuses/Orbits: No acute finding. Hyperostosis about the walls of the left maxillary and left frontal sinuses. Opacification of the frontal sinuses and partial opacification of the left-greater-than-right ethmoid air cells. Other: None. IMPRESSION: No acute intracranial  abnormality. Right posterior scalp hematoma/contusion.  No calvarial fracture. Chronic sinusitis. Electronically Signed   By: Chad Morton M.D.   On: 05/31/2023 20:54    Procedures Procedures    Medications Ordered in ED Medications - No data to display  ED Course/ Medical Decision Making/ A&P                                 Medical Decision Making Amount and/or Complexity of Data Reviewed External Data Reviewed: notes. Labs: ordered. Decision-making details documented in ED Course. Radiology: ordered and independent interpretation performed. Decision-making details documented in ED Course. ECG/medicine tests: ordered and independent interpretation performed. Decision-making details documented in ED Course.   Pt with multiple medical problems and comorbidities and presenting today with a complaint that caries a high risk for morbidity and mortality.  Patient with a syncopal versus near syncopal event today with associated head injury.  Patient does not take any anticoagulation.  Tetanus shot is up-to-date.  This event occurred most likely related to hypoglycemic event as patient reports that he had several cups of juice and started feeling better and then his blood sugar was checked and it was only 80.  He has been compliant with his medications reports had last eaten at lunchtime.  He  denies any recent illness and reports he had felt fine all day long.  He denied any chest pain, palpitations or shortness of breath prior to or after the event.  Lower suspicion for PE, dysrhythmia.  Will do a CT to ensure no injury from his recent fall.  Wound repaired as above.  Will also check labs to ensure normal electrolytes, no AKI. I independently interpreted patient's EKG and labs.  EKG without acute findings.  No evidence of prolonged QT, shortened PR, Brugada's or WPW.  Patient is in a normal sinus rhythm at this time.  CBC with leukocytosis of 15 which I suspect is related to an acute phase reaction from the fall and hypoglycemia, BMP within normal limits.  I have independently visualized and interpreted pt's images today.  Head CT today without acute injury.  After wound was cleaned patient has a very small laceration that does not require repair today.  All these findings were discussed with the patient and his wife.  Patient was able to stand and ambulate without any difficulty reports feeling normal and would like to go home.  No social determinants affecting his discharge today.  No indication for admission at this time.  Patient was given return precautions and has follow-up with his doctor next week.   ED ECG REPORT   Date: 05/31/2023  Rate: 73  Rhythm: normal sinus rhythm  QRS Axis: normal  Intervals: normal  ST/T Wave abnormalities: normal  Conduction Disutrbances:none  Narrative Interpretation:   Old EKG Reviewed: none available  I have personally reviewed the EKG tracing and agree with the computerized printout as noted.          Final Clinical Impression(s) / ED Diagnoses Final diagnoses:  Syncope and collapse  Hypoglycemia  Contusion of scalp, initial encounter    Rx / DC Orders ED Discharge Orders     None         Chad Sprout, MD 05/31/23 2130

## 2023-05-31 NOTE — ED Notes (Signed)
Ambulates to restroom with no assist. Steady on feet.

## 2023-05-31 NOTE — Discharge Instructions (Signed)
If you have any other episodes of passing out you need to follow-up with your doctor or return to the emergency room.  Continue all your medications as prescribed at this time.  If you develop any racing heart, shortness of breath or passing out return to the ER

## 2023-06-01 ENCOUNTER — Encounter: Payer: Self-pay | Admitting: Family Medicine

## 2023-06-01 ENCOUNTER — Other Ambulatory Visit: Payer: Self-pay

## 2023-06-01 DIAGNOSIS — E119 Type 2 diabetes mellitus without complications: Secondary | ICD-10-CM

## 2023-06-01 NOTE — Telephone Encounter (Signed)
FYI

## 2023-06-04 ENCOUNTER — Other Ambulatory Visit: Payer: Self-pay

## 2023-06-04 ENCOUNTER — Other Ambulatory Visit (HOSPITAL_BASED_OUTPATIENT_CLINIC_OR_DEPARTMENT_OTHER): Payer: Self-pay

## 2023-06-04 ENCOUNTER — Other Ambulatory Visit (INDEPENDENT_AMBULATORY_CARE_PROVIDER_SITE_OTHER): Payer: Medicare HMO

## 2023-06-04 ENCOUNTER — Other Ambulatory Visit: Payer: Self-pay | Admitting: Family Medicine

## 2023-06-04 DIAGNOSIS — E119 Type 2 diabetes mellitus without complications: Secondary | ICD-10-CM | POA: Diagnosis not present

## 2023-06-04 LAB — CBC WITH DIFFERENTIAL/PLATELET
Basophils Absolute: 0.1 10*3/uL (ref 0.0–0.1)
Basophils Relative: 0.7 % (ref 0.0–3.0)
Eosinophils Absolute: 0.3 10*3/uL (ref 0.0–0.7)
Eosinophils Relative: 3.9 % (ref 0.0–5.0)
HCT: 44.9 % (ref 39.0–52.0)
Hemoglobin: 14.6 g/dL (ref 13.0–17.0)
Lymphocytes Relative: 26.9 % (ref 12.0–46.0)
Lymphs Abs: 2.4 10*3/uL (ref 0.7–4.0)
MCHC: 32.6 g/dL (ref 30.0–36.0)
MCV: 92 fl (ref 78.0–100.0)
Monocytes Absolute: 0.7 10*3/uL (ref 0.1–1.0)
Monocytes Relative: 7.6 % (ref 3.0–12.0)
Neutro Abs: 5.3 10*3/uL (ref 1.4–7.7)
Neutrophils Relative %: 60.9 % (ref 43.0–77.0)
Platelets: 268 10*3/uL (ref 150.0–400.0)
RBC: 4.88 Mil/uL (ref 4.22–5.81)
RDW: 13.2 % (ref 11.5–15.5)
WBC: 8.7 10*3/uL (ref 4.0–10.5)

## 2023-06-04 LAB — COMPREHENSIVE METABOLIC PANEL
ALT: 13 U/L (ref 0–53)
AST: 11 U/L (ref 0–37)
Albumin: 4.1 g/dL (ref 3.5–5.2)
Alkaline Phosphatase: 43 U/L (ref 39–117)
BUN: 18 mg/dL (ref 6–23)
CO2: 26 meq/L (ref 19–32)
Calcium: 9.5 mg/dL (ref 8.4–10.5)
Chloride: 100 meq/L (ref 96–112)
Creatinine, Ser: 1.05 mg/dL (ref 0.40–1.50)
GFR: 73.65 mL/min (ref >60.00)
Glucose, Bld: 156 mg/dL — ABNORMAL HIGH (ref 70–99)
Potassium: 4.5 meq/L (ref 3.5–5.1)
Sodium: 136 meq/L (ref 135–145)
Total Bilirubin: 0.8 mg/dL (ref 0.2–1.2)
Total Protein: 6.8 g/dL (ref 6.0–8.3)

## 2023-06-04 LAB — HEMOGLOBIN A1C: Hgb A1c MFr Bld: 6.8 % — ABNORMAL HIGH (ref 4.6–6.5)

## 2023-06-05 ENCOUNTER — Other Ambulatory Visit (HOSPITAL_BASED_OUTPATIENT_CLINIC_OR_DEPARTMENT_OTHER): Payer: Self-pay

## 2023-06-05 MED ORDER — ONETOUCH DELICA LANCETS 33G MISC
1.0000 | Freq: Three times a day (TID) | 0 refills | Status: DC
  Filled 2023-06-05: qty 100, 30d supply, fill #0

## 2023-06-07 ENCOUNTER — Other Ambulatory Visit (HOSPITAL_BASED_OUTPATIENT_CLINIC_OR_DEPARTMENT_OTHER): Payer: Self-pay

## 2023-06-07 ENCOUNTER — Ambulatory Visit (INDEPENDENT_AMBULATORY_CARE_PROVIDER_SITE_OTHER): Payer: Medicare HMO | Admitting: Family Medicine

## 2023-06-07 ENCOUNTER — Other Ambulatory Visit (HOSPITAL_COMMUNITY): Payer: Self-pay

## 2023-06-07 ENCOUNTER — Telehealth: Payer: Self-pay | Admitting: Family Medicine

## 2023-06-07 ENCOUNTER — Encounter: Payer: Self-pay | Admitting: Family Medicine

## 2023-06-07 VITALS — BP 116/70 | HR 85 | Temp 97.6°F | Ht 68.0 in | Wt 168.8 lb

## 2023-06-07 DIAGNOSIS — Z23 Encounter for immunization: Secondary | ICD-10-CM | POA: Diagnosis not present

## 2023-06-07 DIAGNOSIS — E119 Type 2 diabetes mellitus without complications: Secondary | ICD-10-CM

## 2023-06-07 DIAGNOSIS — E785 Hyperlipidemia, unspecified: Secondary | ICD-10-CM | POA: Diagnosis not present

## 2023-06-07 DIAGNOSIS — I10 Essential (primary) hypertension: Secondary | ICD-10-CM | POA: Diagnosis not present

## 2023-06-07 DIAGNOSIS — E1169 Type 2 diabetes mellitus with other specified complication: Secondary | ICD-10-CM | POA: Diagnosis not present

## 2023-06-07 DIAGNOSIS — Z7984 Long term (current) use of oral hypoglycemic drugs: Secondary | ICD-10-CM

## 2023-06-07 MED ORDER — OZEMPIC (0.25 OR 0.5 MG/DOSE) 2 MG/3ML ~~LOC~~ SOPN
PEN_INJECTOR | SUBCUTANEOUS | 0 refills | Status: DC
  Filled 2023-06-07: qty 6, 56d supply, fill #0

## 2023-06-07 MED ORDER — OZEMPIC (0.25 OR 0.5 MG/DOSE) 2 MG/3ML ~~LOC~~ SOPN
0.5000 mg | PEN_INJECTOR | SUBCUTANEOUS | 5 refills | Status: DC
  Filled 2023-06-07 (×2): qty 3, 28d supply, fill #0

## 2023-06-07 NOTE — Patient Instructions (Addendum)
diabetes well controlled as far as a1c but we discussed lows are very risky and want to avoid so we opted to completely eliminate the glimepiride even just 2 mg - we opted to start Ozempic and stop his tradjenta as well as long as cost effective -0.25 mg for 4 weeks then 0.5 mg injections ongoing of Ozempic   Please let me know if any low sugars!   Recommended follow up: Return in about 14 weeks (around 09/13/2023) for followup or sooner if needed.Schedule b4 you leave.

## 2023-06-07 NOTE — Telephone Encounter (Signed)
Can medications that were refilled @ today's visit be re-routed to new primary pharmacy?

## 2023-06-07 NOTE — Progress Notes (Signed)
Phone [phone] In person visit   Subjective:   Chad Morton is a 67 y.o. 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    Priority: High   Type 2 diabetes mellitus without complication, without long-term current use of insulin (HCC) 05/30/2017    Priority: High   B12 deficiency 11/25/2021    Priority: Medium    Hypertension, essential 10/09/2017    Priority: Medium    Hyperlipidemia associated with type 2 diabetes mellitus (HCC) 10/09/2017    Priority: Medium    Glaucoma     Priority: Medium    Status post carpal tunnel release 10/24/2018    Priority: Low   Carpal tunnel syndrome 07/09/2018    Priority: Low   Degenerative arthritis of right knee 01/16/2018    Priority: Low   Family history of colon cancer 10/09/2017    Priority: Low    Medications- reviewed and updated Current Outpatient Medications  Medication Sig Dispense Refill   Accu-Chek FastClix Lancets MISC USE TO TEST BLOOD SUGAR ONCE DAILY. Dx: E11.9 102 each 4   benazepril (LOTENSIN) 40 MG tablet Take 1 tablet (40 mg total) by mouth daily. 90 tablet 2   Blood Glucose Monitoring Suppl (BLOOD GLUCOSE MONITOR SYSTEM) w/Device KIT Test in the morning, at noon, and at bedtime. 1 kit 0   brimonidine (ALPHAGAN) 0.2 % ophthalmic solution Instill 1 drop into affected eye(s) twice daily as directed 15 mL 2   dorzolamide-timolol (COSOPT) 2-0.5 % ophthalmic solution Instill 1 drop into affected eye(s) 2 times daily as directed 15 mL 2   empagliflozin (JARDIANCE) 25 MG TABS tablet Take 1 tablet (25 mg total) by mouth daily before breakfast. 30 tablet 11   glucose blood test strip use as directed to test in the morning, evening , and night 100 each 3   metFORMIN (GLUCOPHAGE-XR) 500 MG 24 hr tablet Take 2  tablets (1,000 mg total) by mouth 2 (two) times daily. 360 tablet 3   OneTouch Delica Lancets 33G MISC Test in the morning, at noon, and at bedtime. 100 each 0   rosuvastatin (CRESTOR) 20 MG tablet Take 1 tablet (20 mg total) by mouth daily. 90 tablet 3   Semaglutide,0.25 or 0.5MG /DOS, (OZEMPIC, 0.25 OR 0.5 MG/DOSE,) 2 MG/3ML SOPN Inject 0.25 mg into the skin once a week for 28 days, THEN 0.5 mg once a week for 28 days. 6 mL 0   Semaglutide,0.25 or 0.5MG /DOS, (OZEMPIC, 0.25 OR 0.5 MG/DOSE,) 2 MG/3ML SOPN Inject 0.5 mg into the skin once a week for 28 days. Ongoing prescription after starter dose 3 mL 5   No current facility-administered medications for this visit.     Objective:  BP 116/70   Pulse 85   Temp 97.6 F (36.4 C) (Temporal)   Ht 5\' 8"  (1.727 m)   Wt 168 lb 12.8 oz (76.6 kg)   SpO2 98%   BMI 25.67 kg/m  Gen: NAD, resting comfortably CV: RRR no murmurs rubs or gallops Lungs: CTAB no crackles, wheeze, rhonchi Ext: no edema Skin: warm, dry     Assessment and Plan   # Diabetes-peak A1c of 8.26 November 2019 S: Medication: jardiance 25 mg, Tradjenta 5 mg, metformin 1000mg  BID,  glimepride 8 mg -->2 mg with low blood sugars  CBGs- did get one more 68 reading but that was before reducing glimepiride dose we believe. Looking back over last month had some lows as well as low as 58- we had steadily been reducing glimepiride. His lows have typically been around 6 pm  Lab Results  Component Value Date   HGBA1C 6.8 (H) 06/04/2023   HGBA1C 7.3 (H) 01/31/2023   HGBA1C 6.9 (H) 10/02/2022  A/P: diabetes well controlled as far as a1c but we discussed lows are very risky and want to avoid so we opted to completely eliminate the glimepiride even just 2 mg - we opted to start Ozempic and stop his tradjenta as well as long as cost effective -0.25 mg for 4 weeks then 0.5 mg injections ongoing -recent event extremely high probability of syncope related to hypoglycemia - sugar was ok when checked  but had already had juice at that time. We discussed possible cardiolgoy referral as well but with very strong probability we opted out (no chest pain or shortness of breath or dizziness with episode)   #hyperlipidemia S: Medication: rosuvastatin 20mg , aspirin 81mg   Lab Results  Component Value Date   CHOL 141 10/02/2022   HDL 67.50 10/02/2022   LDLCALC 48 10/02/2022   LDLDIRECT 61.0 05/24/2021   TRIG 127.0 10/02/2022   CHOLHDL 2 10/02/2022   A/P: stable- continue current medicines    #hypertension S: medication: benazepril 40mg  BP Readings from Last 3 Encounters:  06/07/23 116/70  05/31/23 (!) 119/96  04/17/23 114/70  A/P: stable- continue current medicines    #Glaucoma-follows closely with ophthalmology - good report most recently  #Health maintenance - thinks had COVID recently so holding off on vaccine- close contact with positive group and symptoms- he didn't test  Recommended follow up: Return in about 14 weeks (around 09/13/2023) for followup or sooner if needed.Schedule b4 you leave. Future Appointments  Date Time Provider Department Center  05/22/2024  8:30 AM [facility] ANNUAL WELLNESS VISIT 1 [facility]    Lab/Order associations:   ICD-10-CM   1. Type 2 diabetes mellitus without complication, without long-term current use of insulin (HCC)  E11.9     2. Hypertension, essential  I10     3. Hyperlipidemia associated with type 2 diabetes mellitus (HCC)  E11.69    E78.5     4. Need for influenza vaccination  Z23 Flu Vaccine Trivalent High Dose (Fluad)      Meds ordered this encounter  Medications   Semaglutide,0.25 or 0.5MG /DOS, (OZEMPIC, 0.25 OR 0.5 MG/DOSE,) 2 MG/3ML SOPN    Sig: Inject 0.25 mg into the skin once a week for 28 days, THEN 0.5 mg once a week for 28 days.    Dispense:  6 mL    Refill:  0   Semaglutide,0.25 or 0.5MG /DOS, (OZEMPIC, 0.25 OR 0.5 MG/DOSE,) 2 MG/3ML SOPN    Sig: Inject 0.5 mg into the skin once a week for 28 days. Ongoing  prescription after starter dose    Dispense:  3 mL    Refill:  5    Return precautions advised.  Tana Conch, MD

## 2023-06-07 NOTE — Telephone Encounter (Signed)
Patient called stating he noticed we have wrong pharmacy as his primary. Patient is requesting medications be sent to his primary pharmacy as listed below.   [facility] [address] [place] [address] Phone: [phone] Fax: [phone]

## 2023-06-07 NOTE — Telephone Encounter (Signed)
Pharmacy updated.

## 2023-06-08 ENCOUNTER — Other Ambulatory Visit (HOSPITAL_BASED_OUTPATIENT_CLINIC_OR_DEPARTMENT_OTHER): Payer: Self-pay

## 2023-06-08 MED ORDER — OZEMPIC (0.25 OR 0.5 MG/DOSE) 2 MG/3ML ~~LOC~~ SOPN
PEN_INJECTOR | SUBCUTANEOUS | 0 refills | Status: AC
  Filled 2023-06-08 – 2024-02-15 (×4): qty 6, 56d supply, fill #0

## 2023-06-08 MED ORDER — OZEMPIC (0.25 OR 0.5 MG/DOSE) 2 MG/3ML ~~LOC~~ SOPN
0.5000 mg | PEN_INJECTOR | SUBCUTANEOUS | 5 refills | Status: DC
  Filled 2023-06-08 – 2023-07-03 (×2): qty 3, 28d supply, fill #0
  Filled 2023-08-07: qty 3, 28d supply, fill #1
  Filled 2023-09-07: qty 3, 28d supply, fill #2
  Filled 2023-09-28: qty 3, 28d supply, fill #3
  Filled 2023-11-09: qty 3, 28d supply, fill #4
  Filled 2023-12-21: qty 3, 28d supply, fill #5

## 2023-06-08 NOTE — Telephone Encounter (Signed)
Refill sent to requested pharmacy.

## 2023-06-08 NOTE — Addendum Note (Signed)
Addended by: Gwenette Greet on: 06/08/2023 08:44 AM   Modules accepted: Orders

## 2023-06-25 ENCOUNTER — Other Ambulatory Visit (HOSPITAL_BASED_OUTPATIENT_CLINIC_OR_DEPARTMENT_OTHER): Payer: Self-pay

## 2023-06-26 ENCOUNTER — Telehealth: Payer: Self-pay

## 2023-06-26 NOTE — Telephone Encounter (Signed)
Transition Care Management Unsuccessful Follow-up Telephone Call  Date of discharge and from where:  [facility]/5   Attempts:  1st Attempt  Reason for unsuccessful TCM follow-up call:  No answer/busy   [facility]  [facility], [facility] Guide, Phone: [phone] Website: [email or web address]

## 2023-06-27 ENCOUNTER — Other Ambulatory Visit (HOSPITAL_BASED_OUTPATIENT_CLINIC_OR_DEPARTMENT_OTHER): Payer: Self-pay

## 2023-06-27 ENCOUNTER — Other Ambulatory Visit: Payer: Self-pay | Admitting: Family Medicine

## 2023-06-27 DIAGNOSIS — E119 Type 2 diabetes mellitus without complications: Secondary | ICD-10-CM

## 2023-06-27 MED ORDER — METFORMIN HCL ER 500 MG PO TB24
1000.0000 mg | ORAL_TABLET | Freq: Two times a day (BID) | ORAL | 3 refills | Status: DC
  Filled 2023-06-27: qty 360, 90d supply, fill #0
  Filled 2023-09-28: qty 360, 90d supply, fill #1
  Filled 2023-12-21: qty 360, 90d supply, fill #2
  Filled 2024-03-21: qty 360, 90d supply, fill #3

## 2023-06-27 MED ORDER — DORZOLAMIDE HCL-TIMOLOL MAL 2-0.5 % OP SOLN
1.0000 [drp] | Freq: Two times a day (BID) | OPHTHALMIC | 2 refills | Status: DC
  Filled 2023-06-27: qty 10, 50d supply, fill #0
  Filled 2023-06-28 – 2023-07-03 (×2): qty 10, 100d supply, fill #0
  Filled 2023-07-03: qty 10, 50d supply, fill #0
  Filled 2023-08-07: qty 10, 50d supply, fill #1
  Filled 2023-08-07: qty 10, 100d supply, fill #1
  Filled 2023-09-28: qty 10, 50d supply, fill #2

## 2023-06-28 ENCOUNTER — Other Ambulatory Visit (HOSPITAL_BASED_OUTPATIENT_CLINIC_OR_DEPARTMENT_OTHER): Payer: Self-pay

## 2023-06-28 ENCOUNTER — Telehealth: Payer: Self-pay

## 2023-06-28 NOTE — Telephone Encounter (Signed)
Transition Care Management Follow-up Telephone Call Date of discharge and from where: [facility] How have you been since you were released from the hospital? Patient is doing well and has followed up with providers  Any questions or concerns? No  Items Reviewed: Did the pt receive and understand the discharge instructions provided? Yes  Medications obtained and verified? Yes  Other? No  Any new allergies since your discharge? No  Dietary orders reviewed? No Do you have support at home? Yes     Follow up appointments reviewed:  PCP Hospital f/u appt confirmed? Yes  Scheduled to see  on  @ . Specialist Hospital f/u appt confirmed? No  Scheduled to see  on  @ . Are transportation arrangements needed? No  If their condition worsens, is the pt aware to call PCP or go to the Emergency Dept.? Yes Was the patient provided with contact information for the PCP's office or ED? Yes Was to pt encouraged to call back with questions or concerns? Yes

## 2023-07-03 ENCOUNTER — Other Ambulatory Visit (HOSPITAL_BASED_OUTPATIENT_CLINIC_OR_DEPARTMENT_OTHER): Payer: Self-pay

## 2023-07-03 ENCOUNTER — Other Ambulatory Visit: Payer: Self-pay

## 2023-07-05 ENCOUNTER — Encounter: Payer: Self-pay | Admitting: Family Medicine

## 2023-07-25 ENCOUNTER — Other Ambulatory Visit (HOSPITAL_BASED_OUTPATIENT_CLINIC_OR_DEPARTMENT_OTHER): Payer: Self-pay

## 2023-08-03 ENCOUNTER — Encounter: Payer: Self-pay | Admitting: Family Medicine

## 2023-08-06 ENCOUNTER — Ambulatory Visit: Payer: Medicare HMO | Admitting: Family Medicine

## 2023-08-06 ENCOUNTER — Ambulatory Visit: Payer: Self-pay

## 2023-08-06 ENCOUNTER — Encounter: Payer: Self-pay | Admitting: Family Medicine

## 2023-08-06 ENCOUNTER — Other Ambulatory Visit (HOSPITAL_COMMUNITY)
Admission: RE | Admit: 2023-08-06 | Discharge: 2023-08-06 | Disposition: A | Discharge status: Home / Self Care | Payer: Medicare HMO | Source: Ambulatory Visit | Attending: Oncology | Admitting: Oncology

## 2023-08-06 ENCOUNTER — Ambulatory Visit (INDEPENDENT_AMBULATORY_CARE_PROVIDER_SITE_OTHER): Payer: Medicare HMO

## 2023-08-06 VITALS — BP 122/84 | HR 83 | Ht 68.0 in | Wt 163.0 lb

## 2023-08-06 DIAGNOSIS — M25462 Effusion, left knee: Secondary | ICD-10-CM | POA: Diagnosis not present

## 2023-08-06 DIAGNOSIS — G8929 Other chronic pain: Secondary | ICD-10-CM | POA: Insufficient documentation

## 2023-08-06 DIAGNOSIS — Z006 Encounter for examination for normal comparison and control in clinical research program: Secondary | ICD-10-CM | POA: Insufficient documentation

## 2023-08-06 DIAGNOSIS — M1712 Unilateral primary osteoarthritis, left knee: Secondary | ICD-10-CM | POA: Diagnosis not present

## 2023-08-06 DIAGNOSIS — M25562 Pain in left knee: Secondary | ICD-10-CM

## 2023-08-06 NOTE — Patient Instructions (Signed)
Thank you for coming in today.  You received an injection today. Seek immediate medical attention if the joint becomes red, extremely painful, or is oozing fluid.  Please get an Xray today before you leave  

## 2023-08-06 NOTE — Progress Notes (Signed)
I, [name], CMA acting as a scribe for Chad Graham, MD.  Chad Morton is a 67 y.o. male who presents to [facility] today for exacerbation of his L knee pain. Pt was last seen by Dr. Denyse Amass on 02/20/23 and was given a L knee steroid injection.  Today, pt reports worsening knee sx over the past 2-3 weeks. Intermittent sharp pain. Denies swelling. As taken IBU with minimal relief.   Dx imaging: 03/05/18 L knee XR   Pertinent review of systems: no fever or chills  Relevant historical information: Hypertension and diabetes   Exam:  BP 122/84   Pulse 83   Ht 5\' 8"  (1.727 m)   Wt 163 lb (73.9 kg)   SpO2 95%   BMI 24.78 kg/m  General: Well Developed, well nourished, and in no acute distress.   MSK: Left knee minimal effusion normal motion with crepitation.    Lab and Radiology Results  Procedure: Real-time Ultrasound Guided Injection of left knee joint superior lateral patella space Device: Philips Affiniti 50G/GE Logiq Images permanently stored and available for review in PACS Verbal informed consent obtained.  Discussed risks and benefits of procedure. Warned about infection, bleeding, hyperglycemia damage to structures among others. Patient expresses understanding and agreement Time-out conducted.   Noted no overlying erythema, induration, or other signs of local infection.   Skin prepped in a sterile fashion.   Local anesthesia: Topical Ethyl chloride.   With sterile technique and under real time ultrasound guidance: 40 mg of Kenalog and 2 mL of Marcaine injected into knee joint. Fluid seen entering the joint capsule.   Completed without difficulty   Pain immediately resolved suggesting accurate placement of the medication.   Advised to call if fevers/chills, erythema, induration, drainage, or persistent bleeding.   Images permanently stored and available for review in the ultrasound unit.  Impression: Technically successful ultrasound  guided injection.   X-ray images left knee obtained today personally and independently interpreted Retained hardware from ACL reconstruction.  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  No acute fractures are visible. Await formal radiology review     Assessment and Plan: 67 y.o. male with left knee pain thought to be exacerbation of DJD.  Plan for steroid injection today.  Previous injection was about 5-1/2 months ago.  Continue to work on G[employer]er.  Check back as needed. Recommend Voltaren gel.  PDMP not reviewed this encounter. Orders Placed This Encounter  Procedures   [place] LIMITED JOINT SPACE STRUCTURES LOW LEFT(NO LINKED CHARGES)    Order Specific Question:   Reason for Exam (SYMPTOM  OR DIAGNOSIS REQUIRED)    Answer:   left knee pain    Order Specific Question:   Preferred imaging location?    Answer:    [facility] Knee AP/LAT W/Sunrise Left    Standing Status:   Future    Number of Occurrences:   1    Standing Expiration Date:   09/05/2023    Order Specific Question:   Reason for Exam (SYMPTOM  OR DIAGNOSIS REQUIRED)    Answer:   left knee pain    Order Specific Question:   Preferred imaging location?    Answer:   [facility]   No orders of the defined types were placed in this encounter.    Discussed warning signs or symptoms. Please see discharge instructions. Patient expresses understanding.   The above documentation has been reviewed  and is accurate and complete Chad Morton, M.D.

## 2023-08-07 ENCOUNTER — Other Ambulatory Visit: Payer: Self-pay | Admitting: Family Medicine

## 2023-08-07 ENCOUNTER — Other Ambulatory Visit (HOSPITAL_BASED_OUTPATIENT_CLINIC_OR_DEPARTMENT_OTHER): Payer: Self-pay

## 2023-08-07 ENCOUNTER — Other Ambulatory Visit: Payer: Self-pay

## 2023-08-08 ENCOUNTER — Other Ambulatory Visit (HOSPITAL_BASED_OUTPATIENT_CLINIC_OR_DEPARTMENT_OTHER): Payer: Self-pay

## 2023-08-08 MED ORDER — ONETOUCH DELICA LANCETS 33G MISC
1.0000 | Freq: Three times a day (TID) | 0 refills | Status: AC
  Filled 2023-08-08: qty 100, 30d supply, fill #0

## 2023-08-14 LAB — GENECONNECT MOLECULAR SCREEN

## 2023-08-14 LAB — HELIX MOLECULAR SCREEN: Genetic Analysis Overall Interpretation: NEGATIVE

## 2023-08-27 NOTE — Progress Notes (Signed)
Left knee x-ray shows arthritis.

## 2023-08-30 ENCOUNTER — Other Ambulatory Visit (HOSPITAL_BASED_OUTPATIENT_CLINIC_OR_DEPARTMENT_OTHER): Payer: Self-pay

## 2023-09-07 ENCOUNTER — Other Ambulatory Visit (HOSPITAL_BASED_OUTPATIENT_CLINIC_OR_DEPARTMENT_OTHER): Payer: Self-pay

## 2023-09-10 ENCOUNTER — Encounter: Payer: Self-pay | Admitting: Family Medicine

## 2023-09-10 NOTE — Telephone Encounter (Signed)
Forwarding to Dr. Corey to review and advise.  

## 2023-09-11 NOTE — Telephone Encounter (Signed)
That sounds painful.  Besides taking over-the-counter medications for pain control I think scheduling an appointment with me or my colleagues is a reasonable next step.  If you wanted to try some home exercises for greater trochanteric bursitis (you can look them up online) that would be potentially helpful.

## 2023-09-14 ENCOUNTER — Encounter: Payer: Self-pay | Admitting: Family Medicine

## 2023-09-14 ENCOUNTER — Ambulatory Visit (INDEPENDENT_AMBULATORY_CARE_PROVIDER_SITE_OTHER): Payer: Medicare HMO | Admitting: Family Medicine

## 2023-09-14 VITALS — BP 116/66 | HR 88 | Temp 97.4°F | Ht 68.0 in | Wt 157.4 lb

## 2023-09-14 DIAGNOSIS — Z7985 Long-term (current) use of injectable non-insulin antidiabetic drugs: Secondary | ICD-10-CM

## 2023-09-14 DIAGNOSIS — I10 Essential (primary) hypertension: Secondary | ICD-10-CM

## 2023-09-14 DIAGNOSIS — Z7984 Long term (current) use of oral hypoglycemic drugs: Secondary | ICD-10-CM | POA: Diagnosis not present

## 2023-09-14 DIAGNOSIS — E785 Hyperlipidemia, unspecified: Secondary | ICD-10-CM

## 2023-09-14 DIAGNOSIS — E1169 Type 2 diabetes mellitus with other specified complication: Secondary | ICD-10-CM | POA: Diagnosis not present

## 2023-09-14 DIAGNOSIS — E119 Type 2 diabetes mellitus without complications: Secondary | ICD-10-CM

## 2023-09-14 LAB — COMPREHENSIVE METABOLIC PANEL
ALT: 14 U/L (ref 0–53)
AST: 12 U/L (ref 0–37)
Albumin: 4.6 g/dL (ref 3.5–5.2)
Alkaline Phosphatase: 38 U/L — ABNORMAL LOW (ref 39–117)
BUN: 23 mg/dL (ref 6–23)
CO2: 25 meq/L (ref 19–32)
Calcium: 9.5 mg/dL (ref 8.4–10.5)
Chloride: 101 meq/L (ref 96–112)
Creatinine, Ser: 0.97 mg/dL (ref 0.40–1.50)
GFR: 80.84 mL/min (ref >60.00)
Glucose, Bld: 177 mg/dL — ABNORMAL HIGH (ref 70–99)
Potassium: 4.4 meq/L (ref 3.5–5.1)
Sodium: 136 meq/L (ref 135–145)
Total Bilirubin: 0.9 mg/dL (ref 0.2–1.2)
Total Protein: 7 g/dL (ref 6.0–8.3)

## 2023-09-14 LAB — MICROALBUMIN / CREATININE URINE RATIO
Creatinine,U: 34.8 mg/dL
Microalb Creat Ratio: 2 mg/g (ref 0.0–30.0)
Microalb, Ur: <0.7 mg/dL (ref 0.0–1.9)

## 2023-09-14 LAB — HEMOGLOBIN A1C: Hgb A1c MFr Bld: 7.6 % — ABNORMAL HIGH (ref 4.6–6.5)

## 2023-09-14 NOTE — Patient Instructions (Addendum)
Please stop by lab before you go If you have mychart- we will send your results within 3 business days of [place] receiving them.  If you do not have mychart- we will call you about results within 5 business days of [place] receiving them.  *please also note that you will see labs on mychart as soon as they post. I will later go in and write notes on them- will say "notes from Dr. Durene Cal"   could potentially try every other week Ozempic or every 10 days. Focus on getting good protein intake to avoid muscle mass loss  Recommended follow up: Return in about 4 months (around 01/13/2024) for physical or sooner if needed.Schedule b4 you leave.

## 2023-09-14 NOTE — Progress Notes (Signed)
Phone [phone] In person visit   Subjective:   Chad Morton is a 67 y.o. 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 10/09/2017    Priority: High   Type 2 diabetes mellitus without complication, without long-term current use of insulin (HCC) 05/30/2017    Priority: High   B12 deficiency 11/25/2021    Priority: Medium    Hypertension, essential 10/09/2017    Priority: Medium    Hyperlipidemia associated with type 2 diabetes mellitus (HCC) 10/09/2017    Priority: Medium    Glaucoma     Priority: Medium    Status post carpal tunnel release 10/24/2018    Priority: Low   Carpal tunnel syndrome 07/09/2018    Priority: Low   Degenerative arthritis of right knee 01/16/2018    Priority: Low   Family history of colon cancer 10/09/2017    Priority: Low   Chronic pain of left knee 08/06/2023    Medications- reviewed and updated Current Outpatient Medications  Medication Sig Dispense Refill   Accu-Chek FastClix Lancets MISC USE TO TEST BLOOD SUGAR ONCE DAILY. Dx: E11.9 102 each 4   benazepril (LOTENSIN) 40 MG tablet Take 1 tablet (40 mg total) by mouth daily. 90 tablet 2   Blood Glucose Monitoring Suppl (BLOOD GLUCOSE MONITOR SYSTEM) w/Device KIT Test in the morning, at noon, and at bedtime. 1 kit 0   brimonidine (ALPHAGAN) 0.2 % ophthalmic solution Instill 1 drop into affected eye(s) twice daily as directed 15 mL 2   dorzolamide-timolol (COSOPT) 2-0.5 % ophthalmic solution Place 1 drop into affected eyes 2 (two) times daily. 10 mL 2   empagliflozin (JARDIANCE) 25 MG TABS tablet Take 1 tablet (25 mg total) by mouth daily before breakfast. 30 tablet 11   glucose blood test strip use as directed to test in the morning, evening , and night 100 each 3   metFORMIN  (GLUCOPHAGE-XR) 500 MG 24 hr tablet Take 2 tablets (1,000 mg total) by mouth 2 (two) times daily. 360 tablet 3   rosuvastatin (CRESTOR) 20 MG tablet Take 1 tablet (20 mg total) by mouth daily. 90 tablet 3   Semaglutide,0.25 or 0.5MG /DOS, (OZEMPIC, 0.25 OR 0.5 MG/DOSE,) 2 MG/3ML SOPN Inject 0.5 mg into the skin once a week for 28 days. Ongoing prescription after starter dose 3 mL 5   No current facility-administered medications for this visit.     Objective:  BP 116/66   Pulse 88   Temp (!) 97.4 F (36.3 C)   Ht 5\' 8"  (1.727 m)   Wt 157 lb 6.4 oz (71.4 kg)   SpO2 98%   BMI 23.93 kg/m  Gen: NAD, resting comfortably CV: RRR no murmurs rubs or gallops Lungs: CTAB no crackles, wheeze, rhonchi Ext: no edema Skin: warm, dry     Assessment and Plan   # Diabetes-peak A1c of 8.26 November 2019 S: Medication: jardiance 25 mg, Ozempic 0.5 mg (prior Tradjenta), metformin 1000mg  twice daily ER,  -Prior glimepiride but had hypoglycemia  CBGs- no low blood sugars since stopping Exercise and diet- low appetite/not hungry since starting- weight down 11 lbs   Lab Results  Component Value Date   HGBA1C 6.8 (H) 06/04/2023   HGBA1C 7.3 (H) 01/31/2023   HGBA1C 6.9 (H) 10/02/2022  A/P: hopefully stable- update a1c today.  Continue current meds for now  -could potentially try every other week Ozempic or every 10 days  #hyperlipidemia S: Medication:rosuvastatin 20mg , aspirin 81mg    Lab Results  Component Value Date   CHOL 141 10/02/2022   HDL 67.50 10/02/2022   LDLCALC 48 10/02/2022   LDLDIRECT 61.0 05/24/2021   TRIG 127.0 10/02/2022   CHOLHDL 2 10/02/2022    A/P: stable- continue current medicines     #hypertension S: medication: benazepril 40mg  Home readings #s:no recent checks   BP Readings from Last 3 Encounters:  09/14/23 116/66  08/06/23 122/84  06/07/23 116/70  A/P: stable- continue current medicines    Recommended follow up: Return in about 4 months (around 01/13/2024) for  physical or sooner if needed.Schedule b4 you leave. Future Appointments  Date Time Provider Department Center  05/22/2024  8:40 AM [facility] ANNUAL WELLNESS VISIT 1 [facility]   Lab/Order associations:   ICD-10-CM   1. Type 2 diabetes mellitus without complication, without long-term current use of insulin (HCC)  E11.9     2. Hypertension, essential  I10     3. Hyperlipidemia associated with type 2 diabetes mellitus (HCC)  E11.69    E78.5      No orders of the defined types were placed in this encounter.  Return precautions advised.  Tana Conch, MD

## 2023-09-16 ENCOUNTER — Encounter: Payer: Self-pay | Admitting: Family Medicine

## 2023-09-17 NOTE — Telephone Encounter (Signed)
See below

## 2023-09-27 ENCOUNTER — Other Ambulatory Visit: Payer: Self-pay

## 2023-09-27 DIAGNOSIS — E119 Type 2 diabetes mellitus without complications: Secondary | ICD-10-CM

## 2023-09-28 ENCOUNTER — Other Ambulatory Visit (HOSPITAL_BASED_OUTPATIENT_CLINIC_OR_DEPARTMENT_OTHER): Payer: Self-pay

## 2023-10-11 ENCOUNTER — Other Ambulatory Visit: Payer: Self-pay | Admitting: Family Medicine

## 2023-10-11 ENCOUNTER — Other Ambulatory Visit (HOSPITAL_COMMUNITY): Payer: Self-pay

## 2023-10-11 ENCOUNTER — Other Ambulatory Visit (HOSPITAL_BASED_OUTPATIENT_CLINIC_OR_DEPARTMENT_OTHER): Payer: Self-pay

## 2023-10-11 DIAGNOSIS — I10 Essential (primary) hypertension: Secondary | ICD-10-CM

## 2023-10-11 MED ORDER — BENAZEPRIL HCL 40 MG PO TABS
40.0000 mg | ORAL_TABLET | Freq: Every day | ORAL | 2 refills | Status: DC
  Filled 2023-10-11: qty 90, 90d supply, fill #0
  Filled 2024-01-16 (×2): qty 90, 90d supply, fill #1
  Filled 2024-04-18: qty 90, 90d supply, fill #2

## 2023-10-13 ENCOUNTER — Encounter: Payer: Self-pay | Admitting: Family Medicine

## 2023-10-16 ENCOUNTER — Telehealth: Payer: Self-pay

## 2023-10-16 ENCOUNTER — Other Ambulatory Visit (HOSPITAL_COMMUNITY): Payer: Self-pay

## 2023-10-16 NOTE — Telephone Encounter (Signed)
PA request has been Cancelled. New Encounter created for follow up. For additional info see Pharmacy Prior Auth telephone encounter from 01/21.

## 2023-10-16 NOTE — Telephone Encounter (Signed)
See below, pt needing PA for Ozempic.

## 2023-10-16 NOTE — Telephone Encounter (Signed)
Pharmacy Patient Advocate Encounter   Received notification from Fax that prior authorization for Ozempic is required/requested.   Insurance verification completed.   The patient is insured through [facility] ADVANTAGE/RX ADVANCE .   Per test claim: Refill too soon. PA is not needed at this time. Medication was filled 09/28/2023. Next eligible fill date is 10/19/2023.

## 2023-10-18 NOTE — Telephone Encounter (Signed)
Submitted additional information/chart notes to patients plan just in case PA may be needed at a later date- pending receipt of determination.

## 2023-10-31 ENCOUNTER — Encounter: Payer: Self-pay | Admitting: Family Medicine

## 2023-11-01 ENCOUNTER — Other Ambulatory Visit: Payer: Self-pay

## 2023-11-01 DIAGNOSIS — K4090 Unilateral inguinal hernia, without obstruction or gangrene, not specified as recurrent: Secondary | ICD-10-CM

## 2023-11-09 ENCOUNTER — Other Ambulatory Visit (HOSPITAL_BASED_OUTPATIENT_CLINIC_OR_DEPARTMENT_OTHER): Payer: Self-pay

## 2023-11-12 ENCOUNTER — Other Ambulatory Visit (HOSPITAL_BASED_OUTPATIENT_CLINIC_OR_DEPARTMENT_OTHER): Payer: Self-pay

## 2023-11-12 MED ORDER — DORZOLAMIDE HCL-TIMOLOL MAL 2-0.5 % OP SOLN
1.0000 [drp] | Freq: Two times a day (BID) | OPHTHALMIC | 2 refills | Status: DC
  Filled 2023-11-12: qty 10, 50d supply, fill #0
  Filled 2023-12-21: qty 10, 50d supply, fill #1
  Filled 2024-01-16 – 2024-01-25 (×2): qty 10, 50d supply, fill #2
  Filled 2024-03-20: qty 10, 50d supply, fill #3
  Filled 2024-04-15: qty 5, 25d supply, fill #4

## 2023-11-13 DIAGNOSIS — K4090 Unilateral inguinal hernia, without obstruction or gangrene, not specified as recurrent: Secondary | ICD-10-CM | POA: Diagnosis not present

## 2023-11-14 ENCOUNTER — Encounter: Payer: Self-pay | Admitting: Family Medicine

## 2023-11-16 NOTE — Telephone Encounter (Signed)
 See below

## 2023-11-22 ENCOUNTER — Other Ambulatory Visit: Payer: Self-pay | Admitting: Family Medicine

## 2023-11-22 ENCOUNTER — Other Ambulatory Visit (HOSPITAL_BASED_OUTPATIENT_CLINIC_OR_DEPARTMENT_OTHER): Payer: Self-pay

## 2023-11-22 MED ORDER — ROSUVASTATIN CALCIUM 20 MG PO TABS
20.0000 mg | ORAL_TABLET | Freq: Every day | ORAL | 3 refills | Status: AC
  Filled 2023-11-22: qty 90, 90d supply, fill #0
  Filled 2024-02-15: qty 90, 90d supply, fill #1
  Filled 2024-05-22: qty 90, 90d supply, fill #2
  Filled 2024-08-07: qty 90, 90d supply, fill #3

## 2023-12-10 ENCOUNTER — Encounter: Payer: Self-pay | Admitting: Family Medicine

## 2023-12-11 ENCOUNTER — Ambulatory Visit: Payer: Self-pay | Admitting: General Surgery

## 2023-12-11 ENCOUNTER — Other Ambulatory Visit

## 2023-12-11 ENCOUNTER — Encounter: Payer: Self-pay | Admitting: Family Medicine

## 2023-12-11 DIAGNOSIS — E119 Type 2 diabetes mellitus without complications: Secondary | ICD-10-CM

## 2023-12-11 LAB — HEMOGLOBIN A1C: Hgb A1c MFr Bld: 7.3 % — ABNORMAL HIGH (ref 4.6–6.5)

## 2023-12-11 NOTE — Pre-Procedure Instructions (Signed)
 Surgical Instructions   Your procedure is scheduled on December 14, 2023. Report to [facility] Main Entrance "A" at 8:00 A.M., then check in with the Admitting office. Any questions or running late day of surgery: call [phone]  Questions prior to your surgery date: call [phone], Monday-Friday, 8am-4pm. If you experience any cold or flu symptoms such as cough, fever, chills, shortness of breath, etc. between now and your scheduled surgery, please notify us at the above number.     Remember:  Do not eat or drink after midnight the night before your surgery    Take these medicines the morning of surgery with A SIP OF WATER: brimonidine (ALPHAGAN) ophthalmic solution  dorzolamide-timolol (COSOPT) 2-0.5 % ophthalmic solution  rosuvastatin (CRESTOR)    One week prior to surgery, STOP taking any Aspirin (unless otherwise instructed by your surgeon) Aleve, Naproxen, Ibuprofen, Motrin, Advil, Goody's, BC's, all herbal medications, fish oil, and non-prescription vitamins.   WHAT DO I DO ABOUT MY DIABETES MEDICATION?   Do not take metFORMIN (GLUCOPHAGE-XR) the morning of surgery.  STOP taking your empagliflozin (JARDIANCE) three days prior to surgery. Your last dose will be March 17th.   DO NOT take any doses of Semaglutide (OZEMPIC) prior to surgery.      HOW TO MANAGE YOUR DIABETES BEFORE AND AFTER SURGERY  Why is it important to control my blood sugar before and after surgery? Improving blood sugar levels before and after surgery helps healing and can limit problems. A way of improving blood sugar control is eating a healthy diet by:  Eating less sugar and carbohydrates  Increasing activity/exercise  Talking with your doctor about reaching your blood sugar goals High blood sugars (greater than 180 mg/dL) can raise your risk of infections and slow your recovery, so you will need to focus on controlling your diabetes during the weeks before surgery. Make sure that the doctor  who takes care of your diabetes knows about your planned surgery including the date and location.  How do I manage my blood sugar before surgery? Check your blood sugar at least 4 times a day, starting 2 days before surgery, to make sure that the level is not too high or low.  Check your blood sugar the morning of your surgery when you wake up and every 2 hours until you get to the Short Stay unit.  If your blood sugar is less than 70 mg/dL, you will need to treat for low blood sugar: Do not take insulin. Treat a low blood sugar (less than 70 mg/dL) with  cup of clear juice (cranberry or apple), 4 glucose tablets, OR glucose gel. Recheck blood sugar in 15 minutes after treatment (to make sure it is greater than 70 mg/dL). If your blood sugar is not greater than 70 mg/dL on recheck, call [phone] for further instructions. Report your blood sugar to the short stay nurse when you get to Short Stay.  If you are admitted to the hospital after surgery: Your blood sugar will be checked by the staff and you will probably be given insulin after surgery (instead of oral diabetes medicines) to make sure you have good blood sugar levels. The goal for blood sugar control after surgery is 80-180 mg/dL.                      Do NOT Smoke (Tobacco/Vaping) for 24 hours prior to your procedure.  If you use a CPAP at night, you may bring your mask/headgear for your  overnight stay.   You will be asked to remove any contacts, glasses, piercing's, hearing aid's, dentures/partials prior to surgery. Please bring cases for these items if needed.    Patients discharged the day of surgery will not be allowed to drive home, and someone needs to stay with them for 24 hours.  SURGICAL WAITING ROOM VISITATION Patients may have no more than 2 support people in the waiting area - these visitors may rotate.   Pre-op nurse will coordinate an appropriate time for 1 ADULT support person, who may not rotate, to accompany  patient in pre-op.  Children under the age of 31 must have an adult with them who is not the patient and must remain in the main waiting area with an adult.  If the patient needs to stay at the hospital during part of their recovery, the visitor guidelines for inpatient rooms apply.  Please refer to the [facility] website for the visitor guidelines for any additional information.   If you received a COVID test during your pre-op visit  it is requested that you wear a mask when out in public, stay away from anyone that may not be feeling well and notify your surgeon if you develop symptoms. If you have been in contact with anyone that has tested positive in the last 10 days please notify you surgeon.      Pre-operative CHG Bathing Instructions   You can play a key role in reducing the risk of infection after surgery. Your skin needs to be as free of germs as possible. You can reduce the number of germs on your skin by washing with CHG (chlorhexidine gluconate) soap before surgery. CHG is an antiseptic soap that kills germs and continues to kill germs even after washing.   DO NOT use if you have an allergy to chlorhexidine/CHG or antibacterial soaps. If your skin becomes reddened or irritated, stop using the CHG and notify one of our RNs at [phone].              TAKE A SHOWER THE NIGHT BEFORE SURGERY AND THE DAY OF SURGERY    Please keep in mind the following:  DO NOT shave, including legs and underarms, 48 hours prior to surgery.   You may shave your face before/day of surgery.  Place clean sheets on your bed the night before surgery Use a clean washcloth (not used since being washed) for each shower. DO NOT sleep with pet's night before surgery.  CHG Shower Instructions:  Wash your face and private area with normal soap. If you choose to wash your hair, wash first with your normal shampoo.  After you use shampoo/soap, rinse your hair and body thoroughly to remove shampoo/soap  residue.  Turn the water OFF and apply half the bottle of CHG soap to a CLEAN washcloth.  Apply CHG soap ONLY FROM YOUR NECK DOWN TO YOUR TOES (washing for 3-5 minutes)  DO NOT use CHG soap on face, private areas, open wounds, or sores.  Pay special attention to the area where your surgery is being performed.  If you are having back surgery, having someone wash your back for you may be helpful. Wait 2 minutes after CHG soap is applied, then you may rinse off the CHG soap.  Pat dry with a clean towel  Put on clean pajamas    Additional instructions for the day of surgery: DO NOT APPLY any lotions, deodorants, cologne, or perfumes.   Do not wear jewelry or makeup  Do not wear nail polish, gel polish, artificial nails, or any other type of covering on natural nails (fingers and toes) Do not bring valuables to the hospital. [facility] is not responsible for valuables/personal belongings. Put on clean/comfortable clothes.  Please brush your teeth.  Ask your nurse before applying any prescription medications to the skin.

## 2023-12-12 ENCOUNTER — Encounter (HOSPITAL_COMMUNITY): Payer: Self-pay

## 2023-12-12 ENCOUNTER — Other Ambulatory Visit: Payer: Self-pay

## 2023-12-12 ENCOUNTER — Encounter (HOSPITAL_COMMUNITY)
Admission: RE | Admit: 2023-12-12 | Discharge: 2023-12-12 | Disposition: A | Discharge status: Home / Self Care | Source: Ambulatory Visit | Attending: General Surgery | Admitting: General Surgery

## 2023-12-12 VITALS — BP 149/88 | HR 77 | Temp 97.7°F | Resp 18 | Ht 68.0 in | Wt 156.9 lb

## 2023-12-12 DIAGNOSIS — E119 Type 2 diabetes mellitus without complications: Secondary | ICD-10-CM | POA: Insufficient documentation

## 2023-12-12 DIAGNOSIS — Z01812 Encounter for preprocedural laboratory examination: Secondary | ICD-10-CM | POA: Diagnosis not present

## 2023-12-12 LAB — BASIC METABOLIC PANEL
Anion gap: 10 (ref 5–15)
BUN: 15 mg/dL (ref 8–23)
CO2: 23 mmol/L (ref 22–32)
Calcium: 9 mg/dL (ref 8.9–10.3)
Chloride: 101 mmol/L (ref 98–111)
Creatinine, Ser: 0.93 mg/dL (ref 0.61–1.24)
GFR, Estimated: >60 mL/min (ref >60)
Glucose, Bld: 189 mg/dL — ABNORMAL HIGH (ref 70–99)
Potassium: 4.5 mmol/L (ref 3.5–5.1)
Sodium: 134 mmol/L — ABNORMAL LOW (ref 135–145)

## 2023-12-12 LAB — GLUCOSE, CAPILLARY: Glucose-Capillary: 190 mg/dL — ABNORMAL HIGH (ref 70–99)

## 2023-12-12 LAB — CBC
HCT: 42.9 % (ref 39.0–52.0)
Hemoglobin: 14.4 g/dL (ref 13.0–17.0)
MCH: 31.4 pg (ref 26.0–34.0)
MCHC: 33.6 g/dL (ref 30.0–36.0)
MCV: 93.5 fL (ref 80.0–100.0)
Platelets: 246 10*3/uL (ref 150–400)
RBC: 4.59 MIL/uL (ref 4.22–5.81)
RDW: 12.4 % (ref 11.5–15.5)
WBC: 9 10*3/uL (ref 4.0–10.5)
nRBC: 0 % (ref 0.0–0.2)

## 2023-12-12 NOTE — H&P (Signed)
 HPI  Chad Morton is an 68 y.o. male who was seen in clinic on 11/13/23 for left inguinal hernia.  Patient states he has noted the hernia for several months. He is very active and works in the [employer]. 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  He notices some discomfort at times especially with more activity. Has always been able to easily reduce. No overlying skin changes. No obstructive symptoms: no pain/bulge, no nausea/emesis, tolerating PO, passing flatus/having regular bowel movements.   10 point review of systems is negative except as listed above in HPI.  Objective  Past Medical History: Past Medical History:  Diagnosis Date   Arthritis    Blood transfusion without reported diagnosis    at birth    Diabetes mellitus without complication (HCC)    Glaucoma    [facility]   Hyperlipidemia    Hypertension    Post-operative nausea and vomiting     Past Surgical History: Past Surgical History:  Procedure Laterality Date   CARPAL TUNNEL RELEASE Left 10/10/2018   Procedure: LEFT CARPAL TUNNEL RELEASE;  Surgeon: Kathryne Hitch, MD;  Location: [facility];  Service: Orthopedics;  Laterality: Left;   COLONOSCOPY  2010   COLONOSCOPY  2024   FRACTURE SURGERY Left    Pinky Finger in 2000s   KNEE ARTHROSCOPY W/ ACL RECONSTRUCTION Bilateral    both knees x1   MANDIBLE FRACTURE SURGERY Bilateral    TONSILECTOMY/ADENOIDECTOMY WITH MYRINGOTOMY     childhood    Family History:  Family History  Problem Relation Age of Onset   Myasthenia gravis Mother        complications from this   Colon cancer Father        diagnosed 46- died within a year of diagnosis   Diabetes Brother    Hyperlipidemia Brother    Colon polyps Neg Hx    Esophageal cancer Neg Hx    Rectal cancer Neg Hx    Stomach cancer Neg Hx     Social History:  reports  that he has never smoked. He has never used smokeless tobacco. He reports current alcohol use of about 5.0 standard drinks of alcohol per week. He reports that he does not use drugs.  Allergies:  Allergies  Allergen Reactions   Diphtheria Toxoid Anaphylaxis and Swelling    T-Dap    Medications: I have reviewed the patient's current medications.  Labs: Pertinent lab work personally reviewed.  Imaging: No pertinent imaging  Physical Exam General: No acute distress, well appearing HEENT: PERRL, hearing grossly normal, mucous membranes moist CV: Regular rate and rhythm Pulm: Normal work of breathing on room air Abd: Soft, nontender, nondistended. Easily reducible left sided inguinal hernia, no signs of right sided inguinal hernia. GU: Normal external male genitalia, no signs for right sided inguinal hernia when palpated from scrotum with valsalva, left sided hernia palpated with valsalva from left scrotum into inguinal canal Extremities: Warm and well perfused Neuro: A&O x4, no focal neurologic deficits Psych: Appropriate mood and effect     Assessment   Chad Morton is an 68 y.o. male who presents today for left inguinal hernia repair  Plan  - Proceed to OR for open left inguinal hernia repair with mesh - We discussed open left inguinal hernia repair with mesh. We discussed the nature of the procedure and the  risks including but not limited to: bleeding, infection, hematoma, seroma, chronic pain, numbness, possibility of needing to ligate associated nerves, risk of damage to surrounding structures including spermatic cord/vessels/vas deferens as well as risk for hernia recurrence. Patient understands and despite these risks would like to proceed with repair.    Donata Duff, MD [facility]

## 2023-12-12 NOTE — Progress Notes (Signed)
 PCP - Dr. Tana Conch Cardiologist - Denies  PPM/ICD - Denies Device Orders - n/a Rep Notified - n/a  Chest x-ray - n/a EKG - 05/31/2023 Stress Test - Denies ECHO - Denies Cardiac Cath - Denies  Sleep Study - Denies CPAP - n/a  Pt is DM2. He checks his blood suagr 1-2x/day. Normal fasting range is 120-140. CBG at pre-op appointment 190. Last A1c 7.3 on 12/11/23  Last dose of GLP1 agonist- Last dose of Ozempic was February 28th. GLP1 instructions: Pt instructed to NOT take any doses prior to surgery GLP2 instructions: Pt took [place] today. Pt instructed to not take anymore doses prior to surgery  Blood Thinner Instructions: n/a Aspirin Instructions: n/a  NPO after midnight  COVID TEST- n/a   Anesthesia review: No.  Patient denies shortness of breath, fever, cough and chest pain at PAT appointment. Pt denies any respiratory illness/infection in the last two months.   All instructions explained to the patient, with a verbal understanding of the material. Patient agrees to go over the instructions while at home for a better understanding. Patient also instructed to self quarantine after being tested for COVID-19. The opportunity to ask questions was provided.

## 2023-12-13 NOTE — Anesthesia Preprocedure Evaluation (Addendum)
 Anesthesia Evaluation  Patient identified by MRN, date of birth, ID band Patient awake    Reviewed: Allergy & Precautions, H&P , Patient's Chart, lab work & pertinent test results, Unable to perform ROS - Chart review only  History of Anesthesia Complications (+) PONV and history of anesthetic complications  Airway Mallampati: II  TM Distance: >3 FB Neck ROM: Full    Dental no notable dental hx.    Pulmonary neg pulmonary ROS   Pulmonary exam normal        Cardiovascular Exercise Tolerance: Good hypertension, Pt. on medications Normal cardiovascular exam     Neuro/Psych negative neurological ROS     GI/Hepatic negative GI ROS, Neg liver ROS,,,  Endo/Other  diabetes, Type 2, Oral Hypoglycemic Agents    Renal/GU negative Renal ROS     Musculoskeletal  (+) Arthritis , Osteoarthritis,    Abdominal   Peds  Hematology negative hematology ROS (+)   Anesthesia Other Findings   Reproductive/Obstetrics                              Anesthesia Physical Anesthesia Plan  ASA: 3  Anesthesia Plan: General   Post-op Pain Management: Tylenol PO (pre-op)* and Gabapentin PO (pre-op)*   Induction: Intravenous  PONV Risk Score and Plan: 3 and Ondansetron, Treatment may vary due to age or medical condition and Dexamethasone  Airway Management Planned: LMA  Additional Equipment: None  Intra-op Plan:   Post-operative Plan: Extubation in OR  Informed Consent: I have reviewed the patients History and Physical, chart, labs and discussed the procedure including the risks, benefits and alternatives for the proposed anesthesia with the patient or authorized representative who has indicated his/her understanding and acceptance.     Dental advisory given  Plan Discussed with: CRNA  Anesthesia Plan Comments:          Anesthesia Quick Evaluation

## 2023-12-14 ENCOUNTER — Encounter (HOSPITAL_COMMUNITY)
Admission: RE | Disposition: A | Discharge status: Home / Self Care | Payer: Self-pay | Source: Ambulatory Visit | Attending: General Surgery

## 2023-12-14 ENCOUNTER — Other Ambulatory Visit (HOSPITAL_BASED_OUTPATIENT_CLINIC_OR_DEPARTMENT_OTHER): Payer: Self-pay

## 2023-12-14 ENCOUNTER — Ambulatory Visit (HOSPITAL_COMMUNITY)
Admission: RE | Admit: 2023-12-14 | Discharge: 2023-12-14 | Disposition: A | Discharge status: Home / Self Care | Payer: Medicare HMO | Source: Ambulatory Visit | Attending: General Surgery | Admitting: General Surgery

## 2023-12-14 ENCOUNTER — Ambulatory Visit (HOSPITAL_BASED_OUTPATIENT_CLINIC_OR_DEPARTMENT_OTHER): Payer: Self-pay | Admitting: Anesthesiology

## 2023-12-14 ENCOUNTER — Other Ambulatory Visit: Payer: Medicare HMO

## 2023-12-14 ENCOUNTER — Ambulatory Visit (HOSPITAL_COMMUNITY): Payer: Self-pay | Admitting: Anesthesiology

## 2023-12-14 ENCOUNTER — Other Ambulatory Visit: Payer: Self-pay

## 2023-12-14 ENCOUNTER — Encounter (HOSPITAL_COMMUNITY): Payer: Self-pay | Admitting: General Surgery

## 2023-12-14 DIAGNOSIS — D175 Benign lipomatous neoplasm of intra-abdominal organs: Secondary | ICD-10-CM | POA: Diagnosis not present

## 2023-12-14 DIAGNOSIS — K4090 Unilateral inguinal hernia, without obstruction or gangrene, not specified as recurrent: Secondary | ICD-10-CM | POA: Insufficient documentation

## 2023-12-14 DIAGNOSIS — Z7984 Long term (current) use of oral hypoglycemic drugs: Secondary | ICD-10-CM | POA: Insufficient documentation

## 2023-12-14 DIAGNOSIS — E119 Type 2 diabetes mellitus without complications: Secondary | ICD-10-CM | POA: Insufficient documentation

## 2023-12-14 DIAGNOSIS — I10 Essential (primary) hypertension: Secondary | ICD-10-CM | POA: Diagnosis not present

## 2023-12-14 DIAGNOSIS — D176 Benign lipomatous neoplasm of spermatic cord: Secondary | ICD-10-CM | POA: Diagnosis not present

## 2023-12-14 HISTORY — PX: INGUINAL HERNIA REPAIR: SHX194

## 2023-12-14 LAB — GLUCOSE, CAPILLARY
Glucose-Capillary: 183 mg/dL — ABNORMAL HIGH (ref 70–99)
Glucose-Capillary: 142 mg/dL — ABNORMAL HIGH (ref 70–99)
Glucose-Capillary: 111 mg/dL — ABNORMAL HIGH (ref 70–99)

## 2023-12-14 SURGERY — REPAIR, HERNIA, INGUINAL, ADULT
Anesthesia: General | Laterality: Left

## 2023-12-14 MED ORDER — BUPIVACAINE HCL (PF) 0.25 % IJ SOLN
INTRAMUSCULAR | Status: AC
  Filled 2023-12-14: qty 30

## 2023-12-14 MED ORDER — DROPERIDOL 2.5 MG/ML IJ SOLN: 0.6250 mg | Freq: Once | INTRAMUSCULAR | Status: DC | PRN

## 2023-12-14 MED ORDER — ORAL CARE MOUTH RINSE: 15.0000 mL | Freq: Once | OROMUCOSAL | Status: AC

## 2023-12-14 MED ORDER — DEXAMETHASONE SODIUM PHOSPHATE 10 MG/ML IJ SOLN
INTRAMUSCULAR | Status: DC | PRN
  Administered 2023-12-14: 5 mg via INTRAVENOUS

## 2023-12-14 MED ORDER — MIDAZOLAM HCL 2 MG/2ML IJ SOLN
INTRAMUSCULAR | Status: DC | PRN
  Administered 2023-12-14: 2 mg via INTRAVENOUS

## 2023-12-14 MED ORDER — PROPOFOL 10 MG/ML IV BOLUS
INTRAVENOUS | Status: AC
  Filled 2023-12-14: qty 20

## 2023-12-14 MED ORDER — INSULIN ASPART 100 UNIT/ML IJ SOLN
0.0000 [IU] | INTRAMUSCULAR | Status: DC | PRN
  Administered 2023-12-14: 4 [IU] via SUBCUTANEOUS
  Filled 2023-12-14: qty 1

## 2023-12-14 MED ORDER — OXYCODONE HCL 5 MG PO TABS
5.0000 mg | ORAL_TABLET | ORAL | 0 refills | Status: DC | PRN
  Filled 2023-12-14: qty 15, 3d supply, fill #0

## 2023-12-14 MED ORDER — EPHEDRINE SULFATE-NACL 50-0.9 MG/10ML-% IV SOSY
PREFILLED_SYRINGE | INTRAVENOUS | Status: DC | PRN
  Administered 2023-12-14 (×2): 5 mg via INTRAVENOUS

## 2023-12-14 MED ORDER — 0.9 % SODIUM CHLORIDE (POUR BTL) OPTIME
TOPICAL | Status: DC | PRN
  Administered 2023-12-14: 1000 mL

## 2023-12-14 MED ORDER — CHLORHEXIDINE GLUCONATE CLOTH 2 % EX PADS: 6.0000 | MEDICATED_PAD | Freq: Once | CUTANEOUS | Status: DC

## 2023-12-14 MED ORDER — GABAPENTIN 300 MG PO CAPS
ORAL_CAPSULE | ORAL | Status: AC
  Administered 2023-12-14: 300 mg via ORAL
  Filled 2023-12-14: qty 1

## 2023-12-14 MED ORDER — ACETAMINOPHEN 500 MG PO TABS: 1000.0000 mg | ORAL_TABLET | ORAL | Status: AC

## 2023-12-14 MED ORDER — FENTANYL CITRATE (PF) 250 MCG/5ML IJ SOLN
INTRAMUSCULAR | Status: AC
Start: 2023-12-14 — End: ?
  Filled 2023-12-14: qty 5

## 2023-12-14 MED ORDER — CEFAZOLIN SODIUM-DEXTROSE 2-4 GM/100ML-% IV SOLN
2.0000 g | INTRAVENOUS | Status: AC
  Administered 2023-12-14: 2 g via INTRAVENOUS

## 2023-12-14 MED ORDER — PROPOFOL 10 MG/ML IV BOLUS
INTRAVENOUS | Status: DC | PRN
Start: 2023-12-14 — End: 2023-12-14
  Administered 2023-12-14: 130 mg via INTRAVENOUS
  Administered 2023-12-14: 30 mg via INTRAVENOUS

## 2023-12-14 MED ORDER — BUPIVACAINE LIPOSOME 1.3 % IJ SUSP
INTRAMUSCULAR | Status: AC
  Filled 2023-12-14: qty 20

## 2023-12-14 MED ORDER — GABAPENTIN 300 MG PO CAPS: 300.0000 mg | ORAL_CAPSULE | ORAL | Status: AC

## 2023-12-14 MED ORDER — BUPIVACAINE LIPOSOME 1.3 % IJ SUSP
INTRAMUSCULAR | Status: DC | PRN
  Administered 2023-12-14: 40 mL via SURGICAL_CAVITY

## 2023-12-14 MED ORDER — CHLORHEXIDINE GLUCONATE 0.12 % MT SOLN
OROMUCOSAL | Status: AC
  Administered 2023-12-14: 15 mL via OROMUCOSAL
  Filled 2023-12-14: qty 15

## 2023-12-14 MED ORDER — CELECOXIB 200 MG PO CAPS
ORAL_CAPSULE | ORAL | Status: AC
  Administered 2023-12-14: 200 mg via ORAL
  Filled 2023-12-14: qty 1

## 2023-12-14 MED ORDER — PHENYLEPHRINE HCL-NACL 20-0.9 MG/250ML-% IV SOLN
INTRAVENOUS | Status: DC | PRN
Start: 2023-12-14 — End: 2023-12-14
  Administered 2023-12-14: 20 ug/min via INTRAVENOUS

## 2023-12-14 MED ORDER — CHLORHEXIDINE GLUCONATE 0.12 % MT SOLN: 15.0000 mL | Freq: Once | OROMUCOSAL | Status: AC

## 2023-12-14 MED ORDER — CEFAZOLIN SODIUM-DEXTROSE 2-4 GM/100ML-% IV SOLN
INTRAVENOUS | Status: AC
  Filled 2023-12-14: qty 100

## 2023-12-14 MED ORDER — ENOXAPARIN SODIUM 40 MG/0.4ML IJ SOSY
PREFILLED_SYRINGE | INTRAMUSCULAR | Status: AC
  Administered 2023-12-14: 40 mg via SUBCUTANEOUS
  Filled 2023-12-14: qty 0.4

## 2023-12-14 MED ORDER — FENTANYL CITRATE (PF) 250 MCG/5ML IJ SOLN
INTRAMUSCULAR | Status: DC | PRN
  Administered 2023-12-14 (×2): 50 ug via INTRAVENOUS

## 2023-12-14 MED ORDER — PHENYLEPHRINE 80 MCG/ML (10ML) SYRINGE FOR IV PUSH (FOR BLOOD PRESSURE SUPPORT)
PREFILLED_SYRINGE | INTRAVENOUS | Status: DC | PRN
  Administered 2023-12-14 (×5): 80 ug via INTRAVENOUS
  Administered 2023-12-14: 120 ug via INTRAVENOUS

## 2023-12-14 MED ORDER — ONDANSETRON HCL 4 MG/2ML IJ SOLN
INTRAMUSCULAR | Status: DC | PRN
  Administered 2023-12-14: 4 mg via INTRAVENOUS

## 2023-12-14 MED ORDER — PROPOFOL 500 MG/50ML IV EMUL
INTRAVENOUS | Status: DC | PRN
Start: 2023-12-14 — End: 2023-12-14
  Administered 2023-12-14: 200 ug/kg/min via INTRAVENOUS

## 2023-12-14 MED ORDER — LIDOCAINE 2% (20 MG/ML) 5 ML SYRINGE
INTRAMUSCULAR | Status: DC | PRN
  Administered 2023-12-14: 100 mg via INTRAVENOUS

## 2023-12-14 MED ORDER — ACETAMINOPHEN 500 MG PO TABS: 1000.0000 mg | ORAL_TABLET | Freq: Once | ORAL | Status: DC

## 2023-12-14 MED ORDER — LACTATED RINGERS IV SOLN: INTRAVENOUS | Status: DC

## 2023-12-14 MED ORDER — ACETAMINOPHEN 500 MG PO TABS
ORAL_TABLET | ORAL | Status: AC
  Administered 2023-12-14: 1000 mg via ORAL
  Filled 2023-12-14: qty 2

## 2023-12-14 MED ORDER — CELECOXIB 200 MG PO CAPS: 200.0000 mg | ORAL_CAPSULE | ORAL | Status: AC

## 2023-12-14 MED ORDER — ENOXAPARIN SODIUM 40 MG/0.4ML IJ SOSY: 40.0000 mg | PREFILLED_SYRINGE | Freq: Once | INTRAMUSCULAR | Status: AC

## 2023-12-14 MED ORDER — MIDAZOLAM HCL 2 MG/2ML IJ SOLN
INTRAMUSCULAR | Status: AC
  Filled 2023-12-14: qty 2

## 2023-12-14 MED ORDER — FENTANYL CITRATE (PF) 100 MCG/2ML IJ SOLN: 25.0000 ug | INTRAMUSCULAR | Status: DC | PRN

## 2023-12-14 SURGICAL SUPPLY — 38 items
BAG COUNTER SPONGE SURGICOUNT (BAG) ×2 IMPLANT
BLADE CLIPPER SURG (BLADE) IMPLANT
CHLORAPREP W/TINT 26 (MISCELLANEOUS) ×2 IMPLANT
COVER SURGICAL LIGHT HANDLE (MISCELLANEOUS) ×2 IMPLANT
DERMABOND ADVANCED .7 DNX12 (GAUZE/BANDAGES/DRESSINGS) ×2 IMPLANT
DRAIN PENROSE .5X12 LATEX STL (DRAIN) ×2 IMPLANT
DRAPE LAPAROSCOPIC ABDOMINAL (DRAPES) IMPLANT
DRAPE LAPAROTOMY TRNSV 102X78 (DRAPES) IMPLANT
DRSG TEGADERM 4X4.75 (GAUZE/BANDAGES/DRESSINGS) IMPLANT
ELECT CAUTERY BLADE 6.4 (BLADE) IMPLANT
ELECT REM PT RETURN 9FT ADLT (ELECTROSURGICAL) ×1 IMPLANT
ELECTRODE REM PT RTRN 9FT ADLT (ELECTROSURGICAL) ×2 IMPLANT
GAUZE 4X4 16PLY ~~LOC~~+RFID DBL (SPONGE) ×2 IMPLANT
GAUZE SPONGE 4X4 12PLY STRL (GAUZE/BANDAGES/DRESSINGS) IMPLANT
GLOVE BIO SURGEON STRL SZ 6.5 (GLOVE) ×2 IMPLANT
GLOVE BIOGEL PI IND STRL 6 (GLOVE) ×2 IMPLANT
GOWN STRL REUS W/ TWL LRG LVL3 (GOWN DISPOSABLE) ×6 IMPLANT
KIT BASIN OR (CUSTOM PROCEDURE TRAY) ×2 IMPLANT
KIT TURNOVER KIT B (KITS) ×2 IMPLANT
MESH PARIETEX PROGRIP LEFT (Mesh General) IMPLANT
NDL HYPO 25GX1X1/2 BEV (NEEDLE) ×2 IMPLANT
NEEDLE HYPO 25GX1X1/2 BEV (NEEDLE) ×1 IMPLANT
NS IRRIG 1000ML POUR BTL (IV SOLUTION) ×2 IMPLANT
PACK GENERAL/GYN (CUSTOM PROCEDURE TRAY) ×2 IMPLANT
PAD ARMBOARD POSITIONER FOAM (MISCELLANEOUS) ×2 IMPLANT
SPONGE INTESTINAL PEANUT (DISPOSABLE) IMPLANT
SUT MNCRL AB 4-0 PS2 18 (SUTURE) ×2 IMPLANT
SUT NOVA NAB GS-21 0 18 T12 DT (SUTURE) IMPLANT
SUT PDS AB 0 CT 36 (SUTURE) IMPLANT
SUT SILK 0 SH 30 (SUTURE) IMPLANT
SUT SILK 2 0 SH (SUTURE) IMPLANT
SUT SILK 3-0 18XBRD TIE 12 (SUTURE) IMPLANT
SUT VIC AB 0 CT2 27 (SUTURE) ×2 IMPLANT
SUT VIC AB 2-0 SH 27X BRD (SUTURE) ×2 IMPLANT
SUT VIC AB 3-0 SH 27XBRD (SUTURE) ×2 IMPLANT
SYR CONTROL 10ML LL (SYRINGE) ×2 IMPLANT
TOWEL GREEN STERILE (TOWEL DISPOSABLE) ×2 IMPLANT
TOWEL GREEN STERILE FF (TOWEL DISPOSABLE) ×2 IMPLANT

## 2023-12-14 NOTE — Anesthesia Postprocedure Evaluation (Signed)
 Anesthesia Post Note  Patient: Chad Morton  Procedure(s) Performed: LEFT INGUINAL HERNIA REPAIR WITH MESH (Left)     Patient location during evaluation: PACU Anesthesia Type: General Level of consciousness: awake and alert Pain management: pain level controlled Vital Signs Assessment: post-procedure vital signs reviewed and stable Respiratory status: spontaneous breathing, nonlabored ventilation, respiratory function stable and patient connected to nasal cannula oxygen Cardiovascular status: blood pressure returned to baseline and stable Postop Assessment: no apparent nausea or vomiting Anesthetic complications: no   No notable events documented.  Last Vitals:  Vitals:   12/14/23 1208 12/14/23 1221  BP: (!) 84/58 99/64  Pulse: 72 83  Resp: 12 16  Temp: 36.4 C   SpO2: 98%     Last Pain:  Vitals:   12/14/23 1221  TempSrc:   PainSc: 0-No pain                 Collene Schlichter

## 2023-12-14 NOTE — Anesthesia Postprocedure Evaluation (Signed)
 Anesthesia Post Note  Patient: Chad Morton  Procedure(s) Performed: LEFT INGUINAL HERNIA REPAIR WITH MESH (Left)     Patient location during evaluation: PACU Anesthesia Type: General Level of consciousness: awake and alert Pain management: pain level controlled Vital Signs Assessment: post-procedure vital signs reviewed and stable Respiratory status: spontaneous breathing, nonlabored ventilation and respiratory function stable Cardiovascular status: blood pressure returned to baseline and stable Postop Assessment: no apparent nausea or vomiting Anesthetic complications: no   No notable events documented.  Last Vitals:  Vitals:   12/14/23 1208 12/14/23 1221  BP: (!) 84/58 99/64  Pulse: 72 83  Resp: 12 16  Temp: 36.4 C   SpO2: 98%     Last Pain:  Vitals:   12/14/23 1221  TempSrc:   PainSc: 0-No pain                 Collene Schlichter

## 2023-12-14 NOTE — Anesthesia Procedure Notes (Signed)
 Procedure Name: LMA Insertion Date/Time: 12/14/2023 10:40 AM  Performed by: Kayleen Memos, CRNAPre-anesthesia Checklist: Patient identified, Emergency Drugs available, Suction available and Patient being monitored Patient Re-evaluated:Patient Re-evaluated prior to induction Oxygen Delivery Method: Circle System Utilized Preoxygenation: Pre-oxygenation with 100% oxygen Induction Type: IV induction Ventilation: Mask ventilation without difficulty LMA: LMA inserted LMA Size: 4.0 Number of attempts: 1 Airway Equipment and Method: Bite block Placement Confirmation: positive ETCO2 Tube secured with: Tape Dental Injury: Teeth and Oropharynx as per pre-operative assessment

## 2023-12-14 NOTE — Transfer of Care (Signed)
 Immediate Anesthesia Transfer of Care Note  Patient: Chad Morton  Procedure(s) Performed: LEFT INGUINAL HERNIA REPAIR WITH MESH (Left)  Patient Location: PACU  Anesthesia Type:General  Level of Consciousness: sleeping, sedated  Airway & Oxygen Therapy: Patient connected to face mask oxygen  Post-op Assessment: Report given to RN and Post -op Vital signs reviewed and stable  Post vital signs: Reviewed and stable  Last Vitals:  Vitals Value Taken Time  BP 88/59 (68) 12/14/23 1214  Temp    Pulse 77 12/14/23   1214  Resp 12 12/14/23    1214  SpO2 96% 12/14/23    1214    Last Pain:  Vitals:   12/14/23 0849  TempSrc:   PainSc: 0-No pain         Complications: No notable events documented.

## 2023-12-14 NOTE — Interval H&P Note (Signed)
 History and Physical Interval Note:  12/14/2023 8:51 AM  Chad Morton  has presented today for surgery, with the diagnosis of left inguinal hernia.  The various methods of treatment have been discussed with the patient and family. After consideration of risks, benefits and other options for treatment, the patient has consented to  Procedure(s) with comments: LEFT INGUINAL HERNIA REPAIR WITH MESH (Left) - LMA as a surgical intervention.  The patient's history has been reviewed, patient examined, no change in status, stable for surgery.  I have reviewed the patient's chart and labs.  Questions were answered to the patient's satisfaction.     Lysle Rubens

## 2023-12-14 NOTE — Op Note (Signed)
 OPERATIVE REPORT  ?  PATIENT NAME: Chad Morton      AGE:68 y.o.  DATE OF BIRTH: 11-23-1955 MRN: [id number] ?  DATE OF SURGERY: 12/14/2023  ?  PREOPERATIVE DIAGNOSIS: Symptomatic left initial reducible inguinal hernia ?  POSTOPERATIVE DIAGNOSIS: Symptomatic left initial reducible inguinal hernia, direct type   PROCEDURE:  1. Open left inguinal hernia repair with mesh Armanda Heritage repair) 2. Pragmatic, prophylactic neurectomy of the ilioinguinal nerve   Surgeons and Role:    Lysle Rubens, MD - Primary  ESTIMATED BLOOD LOSS: 5cc  IMPLANTS: Progrip Mesh ?  SPECIMENS: cord lipoma ?  COMPLICATIONS: None   OPERATIVE FINDINGS:  The patient had a left inguinal hernia containing fat. There was a small amount of herniated preperitoneal fat.  Reduction was performed followed by repair with progrip placed in a Lichtenstein fashion. ?   OPERATIVE INDICATIONS: Chad Morton is a 68 y.o. M with several month history of left inguinal hernia, reducible, but causing discomfort.  ?  DESCRIPTION OF PROCEDURE: The patient was correctly identified before the procedure and informed consent was reviewed. The patient was taken to the operating room and placed on the operating table in supine position with a safety belt fastened across the thighs.  Prophylactic antibiotics given.  SCDs were applied. General anesthesia was initiated. The patient was positioned Supine - arms out with all appropriate padding and secured to the table.  He was prepped with chloraprep and draped in the usual sterile fashion.  A pre-incision time out was performed with all members of the operating room team.  A line was drawn from the pubic symphysis to the ipsilateral anterior superior iliac spine.  A groin incision was made along this line beginning at the pubic tubercle, cutting down through skin with the scalpel and subcutaneous tissues with cautery.  The superficial epigastric vein was encountered, tied with 3-0  silk and divided.  Local anesthesia was instilled into the external oblique fascia and we opened up the external oblique aponeurosis along the direction of its fibers through the external ring, exposing the inguinal canal and the hernia contents while protecting the ilioinguinal nerve.  The ilioinguinal nerve was identified and the decision was made to sacrifice the nerve. Ligation with 3-0 silk then divided with Chad Morton. The upper leaf of the external oblique aponeurosis was separated from the underlying internal oblique muscle.  The iliohypogastric nerve was identified and protected. The lower leaf was separated from the spermatic cord and hernia bluntly using a kittner.   Medially the cord was cleared from the pubic tubercle and the floor of the inguinal canal. A penrose drain was passed around the cord structures at the level of the pubic tubercle and the cord was lifted off the inguinal floor.  The floor of the inguinal canal was inspected and there was evidence of a direct hernia.  Cremasteric muscle fibers were dissected along the longitudinal axis of the cord. The transversalis fascia was then reapproximated with 3-0 vicryl sutures.  The sac was dissected free down to the internal ring and reduced into abdominal cavity. Any contents had already been reduced.  The vas deferens was clearly identified coursing along the base of the sac.  We then performed [facility] repair with Progrip mesh. which we minimally trimmed.  A 2-0 Prolene suture was used to affix the mesh to the pubic tubercle, ensuring a 1cm mesh overlap over the tubercle.   Two additionally. interrupted sutures with Prolene 2-0 were using to secure mesh laterally.The mesh  was fashioned around the spermatic cord and secured to itself with 2-0 prolene with the remaining defect large enough to admit the tip of a kelley clamp.   The wound was irrigated, and was noted to be hemostatic.  The external oblique fascia was closed with 2-0 Vicryl in  running fashion.  Scarpa's fascia was closed with running 3-0 Vicryl.  The skin was closed with running 4-0 Monocryl and surgical skin glue. Instrument, sponge, and needle counts were correct at the conclusion of the case.    Chad Duff, MD [facility]

## 2023-12-14 NOTE — Discharge Instructions (Signed)
  Post Anesthesia Home Care Instructions  Activity: Get plenty of rest for the remainder of the day. A responsible individual must stay with you for 24 hours following the procedure.  For the next 24 hours, DO NOT: -Drive a car -Advertising copywriter -Drink alcoholic beverages -Take any medication unless instructed by your physician -Make any legal decisions or sign important papers.  Meals: Start with liquid foods such as gelatin or soup. Progress to regular foods as tolerated. Avoid greasy, spicy, heavy foods. If nausea and/or vomiting occur, drink only clear liquids until the nausea and/or vomiting subsides. Call your physician if vomiting continues.  Special Instructions/Symptoms: Your throat may feel dry or sore from the anesthesia or the breathing tube placed in your throat during surgery. If this causes discomfort, gargle with warm salt water. The discomfort should disappear within 24 hours.  Information for Discharge Teaching: EXPAREL (bupivacaine liposome injectable suspension)   Pain relief is important to your recovery. The goal is to control your pain so you can move easier and return to your normal activities as soon as possible after your procedure. Your physician may use several types of medicines to manage pain, swelling, and more.  Your surgeon or anesthesiologist gave you EXPAREL(bupivacaine) to help control your pain after surgery.  EXPAREL is a local anesthetic designed to release slowly over an extended period of time to provide pain relief by numbing the tissue around the surgical site. EXPAREL is designed to release pain medication over time and can control pain for up to 72 hours. Depending on how you respond to EXPAREL, you may require less pain medication during your recovery. EXPAREL can help reduce or eliminate the need for opioids during the first few days after surgery when pain relief is needed the most. EXPAREL is not an opioid and is not addictive. It does not  cause sleepiness or sedation.   Important! A teal colored band has been placed on your arm with the date, time and amount of EXPAREL you have received. Please leave this armband in place for the full 96 hours following administration, and then you may remove the band. If you return to the hospital for any reason within 96 hours following the administration of EXPAREL, the armband provides important information that your health care providers to know, and alerts them that you have received this anesthetic.    Possible side effects of EXPAREL: Temporary loss of sensation or ability to move in the area where medication was injected. Nausea, vomiting, constipation Rarely, numbness and tingling in your mouth or lips, lightheadedness, or anxiety may occur. Call your doctor right away if you think you may be experiencing any of these sensations, or if you have other questions regarding possible side effects.  Follow all other discharge instructions given to you by your surgeon or nurse. Eat a healthy diet and drink plenty of water or other fluids.

## 2023-12-17 ENCOUNTER — Encounter (HOSPITAL_COMMUNITY): Payer: Self-pay | Admitting: General Surgery

## 2023-12-17 LAB — SURGICAL PATHOLOGY

## 2023-12-20 DIAGNOSIS — H401132 Primary open-angle glaucoma, bilateral, moderate stage: Secondary | ICD-10-CM | POA: Diagnosis not present

## 2023-12-21 ENCOUNTER — Other Ambulatory Visit (HOSPITAL_BASED_OUTPATIENT_CLINIC_OR_DEPARTMENT_OTHER): Payer: Self-pay

## 2023-12-21 ENCOUNTER — Other Ambulatory Visit: Payer: Self-pay | Admitting: Family Medicine

## 2023-12-21 MED ORDER — EMPAGLIFLOZIN 25 MG PO TABS
25.0000 mg | ORAL_TABLET | Freq: Every day | ORAL | 11 refills | Status: DC
Start: 2023-12-21 — End: 2024-07-30
  Filled 2023-12-21: qty 30, 30d supply, fill #0
  Filled 2024-01-16: qty 30, 30d supply, fill #1
  Filled 2024-02-15: qty 30, 30d supply, fill #2
  Filled 2024-03-20: qty 30, 30d supply, fill #3
  Filled 2024-04-25: qty 30, 30d supply, fill #4
  Filled 2024-05-22: qty 30, 30d supply, fill #5
  Filled 2024-06-24: qty 30, 30d supply, fill #6
  Filled 2024-07-24: qty 7, 7d supply, fill #7

## 2024-01-16 ENCOUNTER — Other Ambulatory Visit (HOSPITAL_BASED_OUTPATIENT_CLINIC_OR_DEPARTMENT_OTHER): Payer: Self-pay

## 2024-01-16 ENCOUNTER — Other Ambulatory Visit: Payer: Self-pay

## 2024-01-16 MED ORDER — BRIMONIDINE TARTRATE 0.2 % OP SOLN
1.0000 [drp] | Freq: Two times a day (BID) | OPHTHALMIC | 2 refills | Status: AC
  Filled 2024-01-16: qty 15, 75d supply, fill #0
  Filled 2024-04-15: qty 15, 75d supply, fill #1
  Filled 2024-08-28: qty 15, 75d supply, fill #2

## 2024-01-17 ENCOUNTER — Other Ambulatory Visit (HOSPITAL_BASED_OUTPATIENT_CLINIC_OR_DEPARTMENT_OTHER): Payer: Self-pay

## 2024-01-24 ENCOUNTER — Other Ambulatory Visit (HOSPITAL_BASED_OUTPATIENT_CLINIC_OR_DEPARTMENT_OTHER): Payer: Self-pay

## 2024-01-24 ENCOUNTER — Other Ambulatory Visit: Payer: Self-pay | Admitting: Family Medicine

## 2024-01-24 MED ORDER — OZEMPIC (0.25 OR 0.5 MG/DOSE) 2 MG/3ML ~~LOC~~ SOPN
0.5000 mg | PEN_INJECTOR | SUBCUTANEOUS | 5 refills | Status: DC
  Filled 2024-01-24: qty 3, 28d supply, fill #0
  Filled 2024-04-15: qty 3, 28d supply, fill #1
  Filled 2024-05-15: qty 3, 28d supply, fill #2
  Filled 2024-06-10: qty 3, 28d supply, fill #3
  Filled 2024-07-04: qty 3, 28d supply, fill #4
  Filled 2024-07-24: qty 3, 28d supply, fill #5

## 2024-02-15 ENCOUNTER — Other Ambulatory Visit: Payer: Self-pay

## 2024-02-15 ENCOUNTER — Other Ambulatory Visit (HOSPITAL_BASED_OUTPATIENT_CLINIC_OR_DEPARTMENT_OTHER): Payer: Self-pay

## 2024-02-29 ENCOUNTER — Encounter: Payer: Self-pay | Admitting: Family Medicine

## 2024-03-03 ENCOUNTER — Other Ambulatory Visit: Payer: Self-pay

## 2024-03-03 ENCOUNTER — Ambulatory Visit: Admitting: Family Medicine

## 2024-03-03 VITALS — BP 124/82 | HR 80 | Ht 68.0 in | Wt 157.0 lb

## 2024-03-03 DIAGNOSIS — G8929 Other chronic pain: Secondary | ICD-10-CM

## 2024-03-03 DIAGNOSIS — M25562 Pain in left knee: Secondary | ICD-10-CM

## 2024-03-03 NOTE — Patient Instructions (Addendum)
 Thank you for coming in today.   You received an injection today. Seek immediate medical attention if the joint becomes red, extremely painful, or is oozing fluid.   Check back as needed

## 2024-03-03 NOTE — Progress Notes (Unsigned)
   Joanna Muck, PhD, LAT, ATC acting as a scribe for Garlan Juniper, MD.  Chad Morton is a 68 y.o. male who presents to [facility] today for  exacerbation of his L knee pain. Pt was last seen by Dr. Alease Hunter on 08/06/23 and was given a repeat L knee steroid injection and was advised to cont working on quad strengthening.  Today, pt reports L knee returned a few wks ago, progressively worsening. No swelling. He's been doing a lot of kneeling, working on installing a bunch of cabinets.  Dx imaging: 03/05/18 L knee XR   Pertinent review of systems: No fevers or chills  Relevant historical information: Diabetes and hypertension.   Exam:  BP 124/82   Pulse 80   Ht 5\' 8"  (1.727 m)   Wt 157 lb (71.2 kg)   SpO2 98%   BMI 23.87 kg/m  General: Well Developed, well nourished, and in no acute distress.   MSK: Left knee normal.  Mild effusion. Normal motion. Intact strength.    Lab and Radiology Results  Procedure: Real-time Ultrasound Guided Injection of left knee joint superior lateral patella space Device: Philips Affiniti 50G/GE Logiq Images permanently stored and available for review in PACS Verbal informed consent obtained.  Discussed risks and benefits of procedure. Warned about infection, bleeding, hyperglycemia damage to structures among others. Patient expresses understanding and agreement Time-out conducted.   Noted no overlying erythema, induration, or other signs of local infection.   Skin prepped in a sterile fashion.   Local anesthesia: Topical Ethyl chloride.   With sterile technique and under real time ultrasound guidance: 40 mg of Kenalog  and 2 mL of Marcaine  injected into knee joint. Fluid seen entering the joint capsule.   Completed without difficulty   Pain immediately resolved suggesting accurate placement of the medication.   Advised to call if fevers/chills, erythema, induration, drainage, or persistent bleeding.   Images  permanently stored and available for review in the ultrasound unit.  Impression: Technically successful ultrasound guided injection.      Assessment and Plan: 68 y.o. male with left knee pain due to DJD.  Plan for steroid injection today.  Previous injection was about 7 months ago.  Reasonable to continue intermittent    PDMP not reviewed this encounter. Orders Placed This Encounter  Procedures   US  LIMITED JOINT SPACE STRUCTURES LOW LEFT(NO LINKED CHARGES)    Reason for Exam (SYMPTOM  OR DIAGNOSIS REQUIRED):   left knee pain    Preferred imaging location?:   [facility]   No orders of the defined types were placed in this encounter.    Discussed warning signs or symptoms. Please see discharge instructions. Patient expresses understanding.   The above documentation has been reviewed and is accurate and complete Garlan Juniper, M.D.

## 2024-03-18 ENCOUNTER — Encounter: Payer: Self-pay | Admitting: Family Medicine

## 2024-03-18 ENCOUNTER — Ambulatory Visit: Payer: Self-pay | Admitting: Family Medicine

## 2024-03-18 ENCOUNTER — Ambulatory Visit: Payer: Medicare HMO | Admitting: Family Medicine

## 2024-03-18 VITALS — BP 110/70 | HR 82 | Temp 98.4°F | Ht 68.0 in | Wt 154.6 lb

## 2024-03-18 DIAGNOSIS — E119 Type 2 diabetes mellitus without complications: Secondary | ICD-10-CM

## 2024-03-18 DIAGNOSIS — Z7984 Long term (current) use of oral hypoglycemic drugs: Secondary | ICD-10-CM

## 2024-03-18 DIAGNOSIS — Z7985 Long-term (current) use of injectable non-insulin antidiabetic drugs: Secondary | ICD-10-CM | POA: Diagnosis not present

## 2024-03-18 DIAGNOSIS — E785 Hyperlipidemia, unspecified: Secondary | ICD-10-CM

## 2024-03-18 DIAGNOSIS — E538 Deficiency of other specified B group vitamins: Secondary | ICD-10-CM | POA: Diagnosis not present

## 2024-03-18 DIAGNOSIS — Z131 Encounter for screening for diabetes mellitus: Secondary | ICD-10-CM | POA: Diagnosis not present

## 2024-03-18 DIAGNOSIS — Z0000 Encounter for general adult medical examination without abnormal findings: Secondary | ICD-10-CM

## 2024-03-18 DIAGNOSIS — E1169 Type 2 diabetes mellitus with other specified complication: Secondary | ICD-10-CM

## 2024-03-18 DIAGNOSIS — I10 Essential (primary) hypertension: Secondary | ICD-10-CM | POA: Diagnosis not present

## 2024-03-18 LAB — LIPID PANEL
Cholesterol: 138 mg/dL (ref 0–200)
HDL: 77.7 mg/dL (ref >39.00)
LDL Cholesterol: 44 mg/dL (ref 0–99)
NonHDL: 60.74
Total CHOL/HDL Ratio: 2
Triglycerides: 83 mg/dL (ref 0.0–149.0)
VLDL: 16.6 mg/dL (ref 0.0–40.0)

## 2024-03-18 LAB — CBC WITH DIFFERENTIAL/PLATELET
Basophils Absolute: 0 10*3/uL (ref 0.0–0.1)
Basophils Relative: 0.6 % (ref 0.0–3.0)
Eosinophils Absolute: 0.5 10*3/uL (ref 0.0–0.7)
Eosinophils Relative: 5.7 % — ABNORMAL HIGH (ref 0.0–5.0)
HCT: 42 % (ref 39.0–52.0)
Hemoglobin: 14 g/dL (ref 13.0–17.0)
Lymphocytes Relative: 24.7 % (ref 12.0–46.0)
Lymphs Abs: 2 10*3/uL (ref 0.7–4.0)
MCHC: 33.5 g/dL (ref 30.0–36.0)
MCV: 90.8 fl (ref 78.0–100.0)
Monocytes Absolute: 0.5 10*3/uL (ref 0.1–1.0)
Monocytes Relative: 6.3 % (ref 3.0–12.0)
Neutro Abs: 5.1 10*3/uL (ref 1.4–7.7)
Neutrophils Relative %: 62.7 % (ref 43.0–77.0)
Platelets: 215 10*3/uL (ref 150.0–400.0)
RBC: 4.62 Mil/uL (ref 4.22–5.81)
RDW: 13 % (ref 11.5–15.5)
WBC: 8.1 10*3/uL (ref 4.0–10.5)

## 2024-03-18 LAB — COMPREHENSIVE METABOLIC PANEL WITH GFR
ALT: 12 U/L (ref 0–53)
AST: 12 U/L (ref 0–37)
Albumin: 4.4 g/dL (ref 3.5–5.2)
Alkaline Phosphatase: 35 U/L — ABNORMAL LOW (ref 39–117)
BUN: 22 mg/dL (ref 6–23)
CO2: 27 meq/L (ref 19–32)
Calcium: 9.4 mg/dL (ref 8.4–10.5)
Chloride: 101 meq/L (ref 96–112)
Creatinine, Ser: 0.86 mg/dL (ref 0.40–1.50)
GFR: 89.34 mL/min (ref >60.00)
Glucose, Bld: 122 mg/dL — ABNORMAL HIGH (ref 70–99)
Potassium: 4.3 meq/L (ref 3.5–5.1)
Sodium: 136 meq/L (ref 135–145)
Total Bilirubin: 0.9 mg/dL (ref 0.2–1.2)
Total Protein: 6.5 g/dL (ref 6.0–8.3)

## 2024-03-18 LAB — HEMOGLOBIN A1C: Hgb A1c MFr Bld: 7.6 % — ABNORMAL HIGH (ref 4.6–6.5)

## 2024-03-18 LAB — PSA, MEDICARE: PSA: 1.24 ng/mL (ref 0.10–4.00)

## 2024-03-18 LAB — VITAMIN B12: Vitamin B-12: 679 pg/mL (ref 211–911)

## 2024-03-18 NOTE — Patient Instructions (Addendum)
 Please stop by lab before you go If you have mychart- we will send your results within 3 business days of us  receiving them.  If you do not have mychart- we will call you about results within 5 business days of us  receiving them.  *please also note that you will see labs on mychart as soon as they post. I will later go in and write notes on them- will say notes from Dr. Katrinka Finder you are doing so well- hopefully labs look good particularly a1c  Recommended follow up: Return in about 28 weeks (around 09/30/2024) for followup or sooner if needed.Schedule b4 you leave.

## 2024-03-18 NOTE — Progress Notes (Signed)
 Phone: [phone]   Subjective:  Patient presents today for their annual physical. Chief complaint-noted.   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    Priority: High   Type 2 diabetes mellitus without complication, without long-term current use of insulin  (HCC) 05/30/2017    Priority: High   B12 deficiency 11/25/2021    Priority: Medium    Hypertension, essential 10/09/2017    Priority: Medium    Hyperlipidemia associated with type 2 diabetes mellitus (HCC) 10/09/2017    Priority: Medium    Glaucoma     Priority: Medium    Status post carpal tunnel release 10/24/2018    Priority: Low   Carpal tunnel syndrome 07/09/2018    Priority: Low   Degenerative arthritis of right knee 01/16/2018    Priority: Low   Family history of colon cancer 10/09/2017    Priority: Low   Chronic pain of left knee 08/06/2023   Past Surgical History:  Procedure Laterality Date   CARPAL TUNNEL RELEASE Left 10/10/2018   Procedure: LEFT CARPAL TUNNEL RELEASE;  Surgeon: Vernetta Lonni GRADE, MD;  Location: [facility];  Service: Orthopedics;  Laterality: Left;   COLONOSCOPY  2010   COLONOSCOPY  2024   FRACTURE SURGERY Left    Pinky Finger in 2000s   INGUINAL HERNIA REPAIR Left 12/14/2023   Procedure: LEFT INGUINAL HERNIA REPAIR WITH MESH;  Surgeon: Ann Fine, MD;  Location: [facility] OR;  Service: General;   Laterality: Left;  LMA   KNEE ARTHROSCOPY W/ ACL RECONSTRUCTION Bilateral    both knees x1   MANDIBLE FRACTURE SURGERY Bilateral    TONSILECTOMY/ADENOIDECTOMY WITH MYRINGOTOMY     childhood    Family History  Problem Relation Age of Onset   Myasthenia gravis Mother        complications from this   Colon cancer Father        diagnosed 19- died within a year of diagnosis   Diabetes Brother    Hyperlipidemia Brother    Colon polyps Neg Hx    Esophageal cancer Neg Hx    Rectal cancer Neg Hx    Stomach cancer Neg Hx     Medications- reviewed and updated Current Outpatient Medications  Medication Sig Dispense Refill   Accu-Chek FastClix Lancets MISC USE TO TEST BLOOD SUGAR ONCE DAILY. Dx: E11.9 102 each 4   benazepril  (LOTENSIN ) 40 MG tablet Take 1 tablet (40 mg total) by mouth daily. 90 tablet 2   Blood Glucose Monitoring Suppl (BLOOD GLUCOSE MONITOR SYSTEM) w/Device KIT Test in the morning, at noon, and at bedtime. 1 kit 0   brimonidine  (ALPHAGAN ) 0.2 % ophthalmic solution Place 1 drop into the affected eye(s) 2 (two) times daily as directed. 15 mL 2   cyanocobalamin  (VITAMIN B12) 1000 MCG tablet Take 1,000 mcg by mouth daily.     dorzolamide -timolol  (COSOPT ) 2-0.5 % ophthalmic solution Place 1 drop into  affected eye(s) 2 (two) times daily. 15 mL 2   empagliflozin  (JARDIANCE ) 25 MG TABS tablet Take 1 tablet (25 mg total) by mouth daily before breakfast. 30 tablet 11   glucose blood test strip use as directed to test in the morning, evening , and night 100 each 3   metFORMIN  (GLUCOPHAGE -XR) 500 MG 24 hr tablet Take 2 tablets (1,000 mg total) by mouth 2 (two) times daily. 360 tablet 3   rosuvastatin  (CRESTOR ) 20 MG tablet Take 1 tablet (20 mg total) by mouth daily. 90 tablet 3   Semaglutide ,0.25 or 0.5MG /DOS, (OZEMPIC , 0.25 OR 0.5 MG/DOSE,) 2 MG/1.5ML SOPN Inject 0.5 mg into the skin once a week. Takes on Friday     Semaglutide ,0.25 or 0.5MG /DOS, (OZEMPIC , 0.25 OR 0.5 MG/DOSE,) 2 MG/3ML  SOPN Inject 0.25 mg into the skin once a week for 28 days, THEN 0.5 mg once a week for 28 days. 6 mL 0   No current facility-administered medications for this visit.    Allergies-reviewed and updated Allergies  Allergen Reactions   Diphtheria Toxoid Anaphylaxis and Swelling    T-Dap    Social History   Social History Narrative   Married. 3 kids. 5 grandkids       Home remodeling. Wife works for [employer] for at least 2 more years in 2021. [employer]- geography major       Hobbies: time with family, beach, football (season tickets to [employer])   Objective  Objective:  BP 110/70   Pulse 82   Temp 98.4 F (36.9 C) (Temporal)   Ht 5' 8 (1.727 m)   Wt 154 lb 9.6 oz (70.1 kg)   SpO2 98%   BMI 23.51 kg/m  Gen: NAD, resting comfortably HEENT: Mucous membranes are moist. Oropharynx normal Neck: no thyromegaly CV: RRR no murmurs rubs or gallops Lungs: CTAB no crackles, wheeze, rhonchi Abdomen: soft/nontender/nondistended/normal bowel sounds. No rebound or guarding.  Ext: no edema Skin: warm, dry Neuro: grossly normal, moves all extremities, PERRLA  Diabetic foot exam was performed with the following findings:   No deformities, ulcerations, or other skin breakdown Normal sensation of 10g monofilament Intact posterior tibialis and dorsalis pedis pulses      Assessment and Plan  68 y.o. male presenting for annual physical.  Health Maintenance counseling: 1. Anticipatory guidance: Patient counseled regarding regular dental exams -q6 months, eye exams - twice yearly,  avoiding smoking and second hand smoke , limiting alcohol to 2 beverages per day - 1.25 on average per day- little more in summer- nonsummer about 5 a week, no illicit drugs .   2. Risk factor reduction:  Advised patient of need for regular exercise and diet rich and fruits and vegetables to reduce risk of heart attack and stroke.  Exercise- very active with work.  Diet/weight management-weight down 3 pounds from last  visit-we would like to avoid further weight loss.  Wt Readings from Last 3 Encounters:  03/18/24 154 lb 9.6 oz (70.1 kg)  03/03/24 157 lb (71.2 kg)  12/14/23 157 lb (71.2 kg)  3. Immunizations/screenings/ancillary studies-not a candidate for Tdap due to prior reaction.  Prevnar 20 discussed today- opts out for now - discuss again next year.  Holding off on COVID shot  Immunization History  Administered Date(s) Administered   Fluad Trivalent(High Dose 65+) 06/07/2023   PFIZER(Purple Top)SARS-COV-2 Vaccination 12/11/2019, 01/05/2020   Pneumococcal Polysaccharide-23 10/09/2017   Zoster Recombinant(Shingrix ) 11/25/2019, 05/24/2021   4. Prostate cancer screening- low risk prior trend- update PSA  today  .  Occasional nocturia Lab Results  Component Value Date   PSA 1.22 10/02/2022   PSA 1.45 05/24/2021   PSA 1.48 11/25/2019   5. Colon cancer screening - colonoscopy in July 2024 with 5 year follow up due to polyp history with Dr. Wilhelmenia 6. Skin cancer screening-no dermatologist. advised regular sunscreen use. Denies worrisome, changing, or new skin lesions. Lipoma unchanged right arm 7. Smoking associated screening (lung cancer screening, AAA screen 65-75, UA)-never smoker 8. STD screening - only active with wife  Status of chronic or acute concerns   # Left inguinal hernia repair in March with [facility] Dr. Ann -patient has recovered fully.    # Left knee pain-saw Dr. Joane about 2 weeks ago-reports improvement-last injection had been 7 months prior.    # Diabetes-peak A1c of 8.26 November 2019 S: Medication: jardiance  25 mg, Ozempic  0.5 mg (prior Tradjenta ), metformin  1000mg  twice daily ER,  -Prior glimepiride  but had hypoglycemia  CBGs- no further low blood sugars after stopping glimepiride  Lab Results  Component Value Date   HGBA1C 7.3 (H) 12/11/2023   HGBA1C 7.6 (H) 09/14/2023   HGBA1C 6.8 (H) 06/04/2023  A/P: hopefully stable- update a1c today. Continue current  meds for now   #hyperlipidemia S: Medication:rosuvastatin  20mg  Lab Results  Component Value Date   CHOL 141 10/02/2022   HDL 67.50 10/02/2022   LDLCALC 48 10/02/2022   LDLDIRECT 61.0 05/24/2021   TRIG 127.0 10/02/2022   CHOLHDL 2 10/02/2022   A/P: hopefully stable- update lipid panel today. Continue current meds for now   #hypertension S: medication: benazepril  40 mg BP Readings from Last 3 Encounters:  03/18/24 110/70  03/03/24 124/82  12/14/23 110/68  A/P: stable- continue current medicines    #Glaucoma-follows closely with ophthalmology - good report a few months ago  Recommended follow up: Return in about 28 weeks (around 09/30/2024) for followup or sooner if needed.Schedule b4 you leave. Future Appointments  Date Time Provider Department Center  05/22/2024  8:40 AM [facility] ANNUAL WELLNESS VISIT 1 [facility]   Lab/Order associations: NOT fasting- scrambled eggs and coffee   ICD-10-CM   1. Preventative health care  Z00.00     2. Type 2 diabetes mellitus without complication, without long-term current use of insulin  (HCC)  E11.9 Hemoglobin A1c    3. Hypertension, essential  I10 Comprehensive metabolic panel with GFR    CBC with Differential/Platelet    Lipid panel    4. Hyperlipidemia associated with type 2 diabetes mellitus (HCC)  E11.69 Comprehensive metabolic panel with GFR   E78.5 CBC with Differential/Platelet    Lipid panel    5. Screening for diabetes mellitus  Z13.1 PSA, Medicare    6. B12 deficiency  E53.8 Vitamin B12      No orders of the defined types were placed in this encounter.   Return precautions advised.  Garnette Lukes, MD

## 2024-03-19 ENCOUNTER — Other Ambulatory Visit: Payer: Self-pay

## 2024-03-19 DIAGNOSIS — E119 Type 2 diabetes mellitus without complications: Secondary | ICD-10-CM

## 2024-03-20 ENCOUNTER — Other Ambulatory Visit (HOSPITAL_BASED_OUTPATIENT_CLINIC_OR_DEPARTMENT_OTHER): Payer: Self-pay

## 2024-03-21 ENCOUNTER — Other Ambulatory Visit (HOSPITAL_BASED_OUTPATIENT_CLINIC_OR_DEPARTMENT_OTHER): Payer: Self-pay

## 2024-04-15 ENCOUNTER — Other Ambulatory Visit (HOSPITAL_BASED_OUTPATIENT_CLINIC_OR_DEPARTMENT_OTHER): Payer: Self-pay

## 2024-04-15 ENCOUNTER — Other Ambulatory Visit: Payer: Self-pay

## 2024-04-17 ENCOUNTER — Other Ambulatory Visit (HOSPITAL_BASED_OUTPATIENT_CLINIC_OR_DEPARTMENT_OTHER): Payer: Self-pay

## 2024-04-17 MED ORDER — DORZOLAMIDE HCL-TIMOLOL MAL 2-0.5 % OP SOLN
1.0000 [drp] | Freq: Two times a day (BID) | OPHTHALMIC | 2 refills | Status: DC
  Filled 2024-04-17 – 2024-04-25 (×3): qty 10, 50d supply, fill #0
  Filled 2024-06-17: qty 10, 50d supply, fill #1
  Filled 2024-08-07: qty 10, 50d supply, fill #2

## 2024-04-18 ENCOUNTER — Other Ambulatory Visit (HOSPITAL_BASED_OUTPATIENT_CLINIC_OR_DEPARTMENT_OTHER): Payer: Self-pay

## 2024-04-24 ENCOUNTER — Other Ambulatory Visit (HOSPITAL_BASED_OUTPATIENT_CLINIC_OR_DEPARTMENT_OTHER): Payer: Self-pay

## 2024-04-25 ENCOUNTER — Other Ambulatory Visit: Payer: Self-pay

## 2024-04-25 ENCOUNTER — Other Ambulatory Visit (HOSPITAL_BASED_OUTPATIENT_CLINIC_OR_DEPARTMENT_OTHER): Payer: Self-pay

## 2024-05-05 ENCOUNTER — Encounter: Payer: Self-pay | Admitting: Family Medicine

## 2024-05-08 NOTE — Progress Notes (Signed)
 Chad Morton Chad Morton [address] [place] [address] Phone: [phone]   Assessment and Plan:     1. Chronic pain of right knee 2. Primary osteoarthritis of right knee 3. History of repair of ACL  -Chronic with exacerbation, subsequent visit - Recurrent flare of right knee pain most consistent with flare of osteoarthritis.  Has history of right ACL repair with no laxity and no traumatic MOI - X-ray obtained in clinic.  My interpretation: No acute fracture or dislocation.  Hardware from prior ACL graft in place.  Calcific changes along the inferior patella likely resulting from patellar tendon graft for ACL repair - Patient elected for intra-articular CSI.  Tolerated well per note below.  CSI may temporarily increase blood glucose in patient with past medical history of DM type II  Procedure: Knee Joint Injection Side: Right Indication: Flare of osteoarthritis  Risks explained and consent was given verbally. The site was cleaned with alcohol prep. A needle was introduced with an anterio-lateral approach. Injection given using 2mL of 1% lidocaine without epinephrine and 1mL of kenalog 40mg /ml. This was well tolerated.  Needle was removed, hemostasis achieved, and post injection instructions were explained.   Pt was advised to call or return to clinic if these symptoms worsen or fail to improve as anticipated.   Pertinent previous records reviewed include none  Follow Up: As needed if no improvement or worsening of symptoms   Subjective:   I, Chad Morton, am serving as a Neurosurgeon for Doctor Chad Morton  Chief Complaint: Right knee pain   HPI:   05/09/2024 Patient is a 68 year old male with right knee pain. Patient states right knee flared on Saturday. Medial knee pain when in full extension. No MOI. No meds for the pain. Hx ACL. No radiating pain. No numbness or tingling.   Relevant Historical Information: DM type II,  hypertension  Additional pertinent review of systems negative.   Current Outpatient Medications:    Accu-Chek FastClix Lancets MISC, USE TO TEST BLOOD SUGAR ONCE DAILY. Dx: E11.9, Disp: 102 each, Rfl: 4   benazepril (LOTENSIN) 40 MG tablet, Take 1 tablet (40 mg total) by mouth daily., Disp: 90 tablet, Rfl: 2   Blood Glucose Monitoring Suppl (BLOOD GLUCOSE MONITOR SYSTEM) w/Device KIT, Test in the morning, at noon, and at bedtime., Disp: 1 kit, Rfl: 0   brimonidine (ALPHAGAN) 0.2 % ophthalmic solution, Place 1 drop into the affected eye(s) 2 (two) times daily as directed., Disp: 15 mL, Rfl: 2   cyanocobalamin (VITAMIN B12) 1000 MCG tablet, Take 1,000 mcg by mouth daily., Disp: , Rfl:    dorzolamide-timolol (COSOPT) 2-0.5 % ophthalmic solution, Place 1 drop into the affected eye(s) twice daily., Disp: 10 mL, Rfl: 2   empagliflozin (JARDIANCE) 25 MG TABS tablet, Take 1 tablet (25 mg total) by mouth daily before breakfast., Disp: 30 tablet, Rfl: 11   glucose blood test strip, use as directed to test in the morning, evening , and night, Disp: 100 each, Rfl: 3   metFORMIN (GLUCOPHAGE-XR) 500 MG 24 hr tablet, Take 2 tablets (1,000 mg total) by mouth 2 (two) times daily., Disp: 360 tablet, Rfl: 3   rosuvastatin (CRESTOR) 20 MG tablet, Take 1 tablet (20 mg total) by mouth daily., Disp: 90 tablet, Rfl: 3   Semaglutide,0.25 or 0.5MG /DOS, (OZEMPIC, 0.25 OR 0.5 MG/DOSE,) 2 MG/1.5ML SOPN, Inject 0.5 mg into the skin once a week. Takes on Friday, Disp: , Rfl:  Semaglutide,0.25 or 0.5MG /DOS, (OZEMPIC, 0.25 OR 0.5 MG/DOSE,) 2 MG/3ML SOPN, Inject 0.5 mg into the skin once a week for 28 days. Ongoing prescription after starter dose, Disp: 3 mL, Rfl: 5   Objective:     Vitals:   05/09/24 1052  Pulse: 93  SpO2: 99%  Weight: 157 lb (71.2 kg)  Height: 5' 8 (1.727 m)      Body mass index is 23.87 kg/m.    Physical Exam:    General:  awake, alert oriented, no acute distress nontoxic Skin: no suspicious  lesions or rashes Neuro:sensation intact and strength 5/5 with no deficits, no atrophy, normal muscle tone Psych: No signs of anxiety, depression or other mood disorder  Right knee: No swelling No deformity Neg fluid wave, joint milking ROM Flex 110, Ext 0 TTP medial joint line, medial femoral condyle NTTP over the quad tendon,  lat fem condyle, patella, plica, patella tendon, tibial tuberostiy, fibular head, posterior fossa, pes anserine bursa, gerdy's tubercle,  , lateral jt line   Gait normal    Electronically signed by:  Chad Morton Chad Morton 11:12 AM 05/09/24

## 2024-05-09 ENCOUNTER — Ambulatory Visit (INDEPENDENT_AMBULATORY_CARE_PROVIDER_SITE_OTHER)

## 2024-05-09 ENCOUNTER — Ambulatory Visit (INDEPENDENT_AMBULATORY_CARE_PROVIDER_SITE_OTHER): Admitting: Sports Medicine

## 2024-05-09 VITALS — BP 130/80 | HR 93 | Temp 98.2°F | Ht 68.0 in | Wt 157.0 lb

## 2024-05-09 DIAGNOSIS — Z9889 Other specified postprocedural states: Secondary | ICD-10-CM | POA: Diagnosis not present

## 2024-05-09 DIAGNOSIS — M25561 Pain in right knee: Secondary | ICD-10-CM | POA: Diagnosis not present

## 2024-05-09 DIAGNOSIS — M1711 Unilateral primary osteoarthritis, right knee: Secondary | ICD-10-CM

## 2024-05-09 DIAGNOSIS — G8929 Other chronic pain: Secondary | ICD-10-CM

## 2024-05-09 DIAGNOSIS — M25461 Effusion, right knee: Secondary | ICD-10-CM | POA: Diagnosis not present

## 2024-05-15 ENCOUNTER — Other Ambulatory Visit (HOSPITAL_BASED_OUTPATIENT_CLINIC_OR_DEPARTMENT_OTHER): Payer: Self-pay

## 2024-05-21 ENCOUNTER — Ambulatory Visit: Admitting: Sports Medicine

## 2024-05-22 ENCOUNTER — Ambulatory Visit: Payer: Medicare HMO

## 2024-05-22 ENCOUNTER — Other Ambulatory Visit (HOSPITAL_BASED_OUTPATIENT_CLINIC_OR_DEPARTMENT_OTHER): Payer: Self-pay

## 2024-05-22 VITALS — Ht 68.0 in | Wt 157.0 lb

## 2024-05-22 DIAGNOSIS — E785 Hyperlipidemia, unspecified: Secondary | ICD-10-CM

## 2024-05-22 DIAGNOSIS — E1169 Type 2 diabetes mellitus with other specified complication: Secondary | ICD-10-CM

## 2024-05-22 DIAGNOSIS — Z0000 Encounter for general adult medical examination without abnormal findings: Secondary | ICD-10-CM | POA: Diagnosis not present

## 2024-05-22 NOTE — Patient Instructions (Addendum)
 Mr. Marrs , Thank you for taking time out of your busy schedule to complete your Annual Wellness Visit with me. I enjoyed our conversation and look forward to speaking with you again next year. I, as well as your care team,  appreciate your ongoing commitment to your health goals. Please review the following plan we discussed and let me know if I can assist you in the future. Your Game plan/ To Do List    Referrals: If you haven't heard from the office you've been referred to, please reach out to them at the phone provided.   Follow up Visits: We will see or speak with you next year for your Next Medicare AWV with our clinical staff Have you seen your provider in the last 6 months (3 months if uncontrolled diabetes)? Yes  Clinician Recommendations:  Aim for 30 minutes of exercise or brisk walking, 6-8 glasses of water, and 5 servings of fruits and vegetables each day.        This is a list of the screenings recommended for you:  Health Maintenance  Topic Date Due   Yearly kidney health urinalysis for diabetes  Never done   Pneumococcal Vaccine for age over 78 (2 of 2 - PCV) 10/09/2018   COVID-19 Vaccine (3 - Pfizer risk series) 02/02/2020   Flu Shot  04/25/2024   Eye exam for diabetics  05/21/2024   Hemoglobin A1C  09/17/2024   Yearly kidney function blood test for diabetes  03/18/2025   Complete foot exam   03/18/2025   Medicare Annual Wellness Visit  05/22/2025   Colon Cancer Screening  04/16/2028   Hepatitis C Screening  Completed   Zoster (Shingles) Vaccine  Completed   HPV Vaccine  Aged Out   Meningitis B Vaccine  Aged Out   DTaP/Tdap/Td vaccine  Discontinued    Advanced directives: (Copy Requested) Please bring a copy of your health care power of attorney and living will to the office to be added to your chart at your convenience. You can mail to [facility] [address] [place], [place] [address] or email to [email or web address] Advance Care  Planning is important because it:  [x]  Makes sure you receive the medical care that is consistent with your values, goals, and preferences  [x]  It provides guidance to your family and loved ones and reduces their decisional burden about whether or not they are making the right decisions based on your wishes.  Follow the link provided in your after visit summary or read over the paperwork we have mailed to you to help you started getting your Advance Directives in place. If you need assistance in completing these, please reach out to us  so that we can help you!  See attachments for Preventive Care and Fall Prevention Tips.

## 2024-05-22 NOTE — Progress Notes (Signed)
 Subjective:   Chad Morton is a 68 y.o. who presents for a Medicare Wellness preventive visit.  As a reminder, Annual Wellness Visits don't include a physical exam, and some assessments may be limited, especially if this visit is performed virtually. We may recommend an in-person follow-up visit with your provider if needed.  Visit Complete: Virtual I connected with  Chad Morton on 05/22/24 by a audio enabled telemedicine application and verified that I am speaking with the correct person using two identifiers.  Patient Location: Home  Provider Location: Office/Clinic  I discussed the limitations of evaluation and management by telemedicine. The patient expressed understanding and agreed to proceed.  Vital Signs: Because this visit was a virtual/telehealth visit, some criteria may be missing or patient reported. Any vitals not documented were not able to be obtained and vitals that have been documented are patient reported.  VideoDeclined- This patient declined Librarian, academic. Therefore the visit was completed with audio only.  Persons Participating in Visit: Patient.  AWV Questionnaire: No: Patient Medicare AWV questionnaire was not completed prior to this visit.  Cardiac Risk Factors include: advanced age (>67men, >30 women);dyslipidemia;diabetes mellitus;hypertension;male gender     Objective:    Today's Vitals   05/22/24 0824  Weight: 157 lb (71.2 kg)  Height: 5' 8 (1.727 m)   Body mass index is 23.87 kg/m.     05/22/2024    8:27 AM 12/12/2023    9:15 AM 05/31/2023    6:50 PM 05/17/2023    8:35 AM 03/03/2023    6:30 PM 05/09/2022    8:38 AM 10/10/2018    6:23 AM  Advanced Directives  Does Patient Have a Medical Advance Directive? Yes No No Yes No Yes Yes   Type of Estate agent of [place];Living will   Healthcare Power of [place];Living will  Healthcare Power of [place];Living will Healthcare Power of  [place];Living will  Does patient want to make changes to medical advance directive?       No - Patient declined   Copy of Healthcare Power of Attorney in Chart? No - copy requested   No - copy requested  No - copy requested No - copy requested   Would patient like information on creating a medical advance directive?  No - Patient declined No - Patient declined         Data saved with a previous flowsheet row definition    Current Medications (verified) Outpatient Encounter Medications as of 05/22/2024  Medication Sig   Accu-Chek FastClix Lancets MISC USE TO TEST BLOOD SUGAR ONCE DAILY. Dx: E11.9   benazepril  (LOTENSIN ) 40 MG tablet Take 1 tablet (40 mg total) by mouth daily.   Blood Glucose Monitoring Suppl (BLOOD GLUCOSE MONITOR SYSTEM) w/Device KIT Test in the morning, at noon, and at bedtime.   brimonidine  (ALPHAGAN ) 0.2 % ophthalmic solution Place 1 drop into the affected eye(s) 2 (two) times daily as directed.   cyanocobalamin  (VITAMIN B12) 1000 MCG tablet Take 1,000 mcg by mouth daily.   dorzolamide -timolol  (COSOPT ) 2-0.5 % ophthalmic solution Place 1 drop into the affected eye(s) twice daily.   empagliflozin  (JARDIANCE ) 25 MG TABS tablet Take 1 tablet (25 mg total) by mouth daily before breakfast.   glucose blood test strip use as directed to test in the morning, evening , and night   metFORMIN  (GLUCOPHAGE -XR) 500 MG 24 hr tablet Take 2 tablets (1,000 mg total) by mouth 2 (two) times daily.   rosuvastatin  (CRESTOR ) 20  MG tablet Take 1 tablet (20 mg total) by mouth daily.   Semaglutide ,0.25 or 0.5MG /DOS, (OZEMPIC , 0.25 OR 0.5 MG/DOSE,) 2 MG/1.5ML SOPN Inject 0.5 mg into the skin once a week. Takes on Friday   Semaglutide ,0.25 or 0.5MG /DOS, (OZEMPIC , 0.25 OR 0.5 MG/DOSE,) 2 MG/3ML SOPN Inject 0.5 mg into the skin once a week for 28 days. Ongoing prescription after starter dose   No facility-administered encounter medications on file as of 05/22/2024.    Allergies  (verified) Diphtheria toxoid   History: Past Medical History:  Diagnosis Date   Arthritis    Blood transfusion without reported diagnosis    at birth    Diabetes mellitus without complication (HCC)    Glaucoma    [facility]   Hyperlipidemia    Hypertension    Post-operative nausea and vomiting    Past Surgical History:  Procedure Laterality Date   CARPAL TUNNEL RELEASE Left 10/10/2018   Procedure: LEFT CARPAL TUNNEL RELEASE;  Surgeon: Vernetta Lonni GRADE, MD;  Location:  SURGERY CENTER;  Service: Orthopedics;  Laterality: Left;   COLONOSCOPY  2010   COLONOSCOPY  2024   FRACTURE SURGERY Left    [place] in 2000s   INGUINAL HERNIA REPAIR Left 12/14/2023   Procedure: LEFT INGUINAL HERNIA REPAIR WITH MESH;  Surgeon: Ann Fine, MD;  Location: [facility] OR;  Service: General;  Laterality: Left;  LMA   KNEE ARTHROSCOPY W/ ACL RECONSTRUCTION Bilateral    both knees x1   MANDIBLE FRACTURE SURGERY Bilateral    TONSILECTOMY/ADENOIDECTOMY WITH MYRINGOTOMY     childhood   Family History  Problem Relation Age of Onset   Myasthenia gravis Mother        complications from this   Colon cancer Father        diagnosed 41- died within a year of diagnosis   Diabetes Brother    Hyperlipidemia Brother    Colon polyps Neg Hx    Esophageal cancer Neg Hx    Rectal cancer Neg Hx    Stomach cancer Neg Hx    Social History   Socioeconomic History   Marital status: Married    Spouse name: Not on file   Number of children: Not on file   Years of education: Not on file   Highest education level: Bachelor's degree (e.g., BA, AB, BS)  Occupational History   Not on file  Tobacco Use   Smoking status: Never   Smokeless tobacco: Never  Vaping Use   Vaping status: Never Used  Substance and Sexual Activity   Alcohol use: Yes    Alcohol/week: 5.0 standard drinks of alcohol    Types: 5 Standard drinks or equivalent per week   Drug use: No   Sexual activity: Yes   Other Topics Concern   Not on file  Social History Narrative   Married. 3 kids. 5 grandkids       Home remodeling. Wife works for [employer] for at least 2 more years in 2021. [employer]- geography major       Hobbies: time with family, beach, football (season tickets to [employer])   Social Drivers of Health   Financial Resource Strain: Low Risk  (05/22/2024)   Overall Financial Resource Strain (CARDIA)    Difficulty of Paying Living Expenses: Not hard at all  Food Insecurity: No Food Insecurity (05/22/2024)   Hunger Vital Sign    Worried About Running Out of Food in the Last Year: Never true    Ran Out of Food in the  Last Year: Never true  Transportation Needs: No Transportation Needs (05/22/2024)   PRAPARE - Administrator, Civil Service (Medical): No    Lack of Transportation (Non-Medical): No  Physical Activity: Sufficiently Active (05/22/2024)   Exercise Vital Sign    Days of Exercise per Week: 6 days    Minutes of Exercise per Session: 30 min  Stress: No Stress Concern Present (05/22/2024)   [employer] of Occupational Health - Occupational Stress Questionnaire    Feeling of Stress: Not at all  Social Connections: Moderately Integrated (05/22/2024)   Social Connection and Isolation Panel    Frequency of Communication with Friends and Family: More than three times a week    Frequency of Social Gatherings with Friends and Family: More than three times a week    Attends Religious Services: 1 to 4 times per year    Active Member of [employer] or Organizations: No    Attends Engineer, structural: Never    Marital Status: Married    Tobacco Counseling Counseling given: Not Answered    Clinical Intake:  Pre-visit preparation completed: Yes  Pain : No/denies pain     BMI - recorded: 23.87 Nutritional Status: BMI of 19-24  Normal Nutritional Risks: None Diabetes: Yes CBG done?: No Did pt. bring in CBG monitor from home?: No  Lab Results  Component Value Date    HGBA1C 7.6 (H) 03/18/2024   HGBA1C 7.3 (H) 12/11/2023   HGBA1C 7.6 (H) 09/14/2023     How often do you need to have someone help you when you read instructions, pamphlets, or other written materials from your doctor or pharmacy?: 1 - Never  Interpreter Needed?: No  Information entered by :: Chad Haws, Chad Morton   Activities of Daily Living     05/22/2024    8:26 AM 12/14/2023    8:27 AM  In your present state of health, do you have any difficulty performing the following activities:  Hearing? 0 0  Vision? 0 0  Difficulty concentrating or making decisions? 0 0  Walking or climbing stairs? 0   Dressing or bathing? 0   Doing errands, shopping? 0   Preparing Food and eating ? N   Using the Toilet? N   In the past six months, have you accidently leaked urine? N   Do you have problems with loss of bowel control? N   Managing your Medications? N   Managing your Finances? N   Housekeeping or managing your Housekeeping? N     Patient Care Team: Katrinka Garnette KIDD, MD as PCP - General (Family Medicine) Dr. Lauraine Sago as Consulting Physician (Ophthalmology) [facility] as Consulting Physician (Orthopedic Surgery) Dr. Debrah as Consulting Physician (Dentistry)  I have updated your Care Teams any recent Medical Services you may have received from other providers in the past year.     Assessment:   This is a routine wellness examination for Goran.  Hearing/Vision screen Hearing Screening - Comments:: Pt denies any hearing issues  Vision Screening - Comments:: Wears rx glasses - up to date with routine eye exams with Dr Lauraine Fass    Goals Addressed             This Visit's Progress    Patient Stated       Keeping A1C under control        Depression Screen     05/22/2024    8:28 AM 09/14/2023    8:18 AM 06/07/2023   10:19 AM  05/17/2023    8:35 AM 01/31/2023    7:57 AM 05/09/2022    8:37 AM 05/24/2021    1:02 PM  PHQ 2/9 Scores  PHQ - 2 Score 0 0 0 0 0 0 0   PHQ- 9 Score  0   0      Fall Risk     05/22/2024    8:29 AM 06/07/2023   10:19 AM 05/14/2023    7:43 AM 01/31/2023    7:57 AM 05/09/2022    8:39 AM  Fall Risk   Falls in the past year? 0 1 1 0 0  Number falls in past yr: 0 1 0 0 0  Injury with Fall? 0 1 1 0 0  Comment back of head  fell off ladder    Risk for fall due to : No Fall Risks History of fall(s) Impaired vision No Fall Risks Impaired vision  Follow up Falls prevention discussed Falls evaluation completed Falls prevention discussed Falls evaluation completed Falls prevention discussed      Data saved with a previous flowsheet row definition    MEDICARE RISK AT HOME:  Medicare Risk at Home Any stairs in or around the home?: Yes If so, are there any without handrails?: No Home free of loose throw rugs in walkways, pet beds, electrical cords, etc?: Yes Adequate lighting in your home to reduce risk of falls?: Yes Life alert?: No Use of a cane, walker or w/c?: No Grab bars in the bathroom?: No Shower chair or bench in shower?: Yes Elevated toilet seat or a handicapped toilet?: No  TIMED UP AND GO:  Was the test performed?  No  Cognitive Function: 6CIT completed        05/22/2024    8:31 AM 05/17/2023    8:37 AM 05/09/2022    8:49 AM  6CIT Screen  What Year? 0 points 0 points 0 points  What month? 0 points 0 points 0 points  What time? 0 points 0 points 0 points  Count back from 20 0 points 0 points 0 points  Months in reverse 0 points 0 points 0 points  Repeat phrase 0 points 0 points 0 points  Total Score 0 points 0 points 0 points    Immunizations Immunization History  Administered Date(s) Administered   Fluad Trivalent(High Dose 65+) 06/07/2023   PFIZER(Purple Top)SARS-COV-2 Vaccination 12/11/2019, 01/05/2020   Pneumococcal Polysaccharide-23 10/09/2017   Zoster Recombinant(Shingrix ) 11/25/2019, 05/24/2021    Screening Tests Health Maintenance  Topic Date Due   Diabetic kidney evaluation - Urine ACR   Never done   Pneumococcal Vaccine: 50+ Years (2 of 2 - PCV) 10/09/2018   COVID-19 Vaccine (3 - Pfizer risk series) 02/02/2020   INFLUENZA VACCINE  04/25/2024   OPHTHALMOLOGY EXAM  05/21/2024   HEMOGLOBIN A1C  09/17/2024   Diabetic kidney evaluation - eGFR measurement  03/18/2025   FOOT EXAM  03/18/2025   Medicare Annual Wellness (AWV)  05/22/2025   Colonoscopy  04/16/2028   Hepatitis C Screening  Completed   Zoster Vaccines- Shingrix   Completed   HPV VACCINES  Aged Out   Meningococcal B Vaccine  Aged Out   DTaP/Tdap/Td  Discontinued    Health Maintenance  Health Maintenance Due  Topic Date Due   Diabetic kidney evaluation - Urine ACR  Never done   Pneumococcal Vaccine: 50+ Years (2 of 2 - PCV) 10/09/2018   COVID-19 Vaccine (3 - Pfizer risk series) 02/02/2020   INFLUENZA VACCINE  04/25/2024   OPHTHALMOLOGY EXAM  05/21/2024   Health Maintenance Items Addressed: Labs Ordered: Urine ACR   Additional Screening:  Vision Screening: Recommended annual ophthalmology exams for early detection of glaucoma and other disorders of the eye. Would you like a referral to an eye doctor? No    Dental Screening: Recommended annual dental exams for proper oral hygiene  Community Resource Referral / Chronic Care Management: CRR required this visit?  No   CCM required this visit?  No   Plan:    I have personally reviewed and noted the following in the patient's chart:   Medical and social history Use of alcohol, tobacco or illicit drugs  Current medications and supplements including opioid prescriptions. Patient is not currently taking opioid prescriptions. Functional ability and status Nutritional status Physical activity Advanced directives List of other physicians Hospitalizations, surgeries, and ER visits in previous 12 months Vitals Screenings to include cognitive, depression, and falls Referrals and appointments  In addition, I have reviewed and discussed with patient  certain preventive protocols, quality metrics, and best practice recommendations. A written personalized care plan for preventive services as well as general preventive health recommendations were provided to patient.   Chad VEAR Haws, Chad Morton   [birth year]   After Visit Summary: (MyChart) Due to this being a telephonic visit, the after visit summary with patients personalized plan was offered to patient via MyChart   Notes: Pt stated he has upcoming ophthalmology appointment with Dr lauraine Fass and pt made aware of immunizations due

## 2024-05-23 ENCOUNTER — Ambulatory Visit: Payer: Self-pay | Admitting: Sports Medicine

## 2024-06-10 ENCOUNTER — Other Ambulatory Visit (HOSPITAL_BASED_OUTPATIENT_CLINIC_OR_DEPARTMENT_OTHER): Payer: Self-pay

## 2024-06-17 ENCOUNTER — Other Ambulatory Visit (HOSPITAL_BASED_OUTPATIENT_CLINIC_OR_DEPARTMENT_OTHER): Payer: Self-pay

## 2024-06-20 ENCOUNTER — Telehealth: Payer: Self-pay | Admitting: Family Medicine

## 2024-06-20 NOTE — Telephone Encounter (Signed)
 Pt requested needles for Ozempic , not on current list.

## 2024-06-20 NOTE — Telephone Encounter (Unsigned)
 Copied from CRM #[id number]. Topic: Clinical - Medication Refill >> Jun 20, 2024 10:19 AM Alfonso ORN wrote: Medication: Pt says he needs the needle for his Ozempic  and the pharmacy advised he'd need a prescription sent.   Has the patient contacted their pharmacy? Yes  This is the patient's preferred pharmacy:  [facility]  [address] [place] [address] Phne: [phone]  Fax: [phone]  Is this the correct pharmacy for this prescription? Yes If no, delete pharmacy and type the correct one.   Has the prescription been filled recently? Yes  Is the patient out of the medication? No  Has the patient been seen for an appointment in the last year OR does the patient have an upcoming appointment? Yes  Can we respond through MyChart? Yes

## 2024-06-23 ENCOUNTER — Telehealth: Payer: Self-pay | Admitting: Family Medicine

## 2024-06-23 ENCOUNTER — Other Ambulatory Visit: Payer: Self-pay | Admitting: Family Medicine

## 2024-06-23 DIAGNOSIS — E119 Type 2 diabetes mellitus without complications: Secondary | ICD-10-CM

## 2024-06-23 MED ORDER — INSULIN PEN NEEDLE 30G X 8 MM MISC: 3.0000 | Status: AC | PRN

## 2024-06-23 NOTE — Telephone Encounter (Signed)
 Sent to pharmacy

## 2024-06-23 NOTE — Telephone Encounter (Signed)
 Okay to send needles? Looks like this is not on the current list.

## 2024-06-23 NOTE — Telephone Encounter (Signed)
 This is being addressed in a separate encounter.    Copied from CRM #[id number]. Topic: Clinical - Medication Refill >> Jun 20, 2024 10:19 AM Alfonso ORN wrote: Medication: Pt says he needs the needle for his Ozempic  and the pharmacy advised he'd need a prescription sent.   Has the patient contacted their pharmacy? Yes  This is the patient's preferred pharmacy:  [facility]  [address] [place] [address] Phne: [phone]  Fax: [phone]  Is this the correct pharmacy for this prescription? Yes If no, delete pharmacy and type the correct one.   Has the prescription been filled recently? Yes  Is the patient out of the medication? No  Has the patient been seen for an appointment in the last year OR does the patient have an upcoming appointment? Yes  Can we respond through MyChart? Yes >> Jun 20, 2024  3:42 PM Chiquita SQUIBB wrote: Patient is calling in again regarding needing the needles. Please advise the patient when it has been sent to the pharmacy.

## 2024-06-24 ENCOUNTER — Other Ambulatory Visit (HOSPITAL_BASED_OUTPATIENT_CLINIC_OR_DEPARTMENT_OTHER): Payer: Self-pay

## 2024-06-24 ENCOUNTER — Other Ambulatory Visit: Payer: Self-pay | Admitting: Family Medicine

## 2024-06-24 DIAGNOSIS — H2513 Age-related nuclear cataract, bilateral: Secondary | ICD-10-CM | POA: Diagnosis not present

## 2024-06-24 DIAGNOSIS — H401132 Primary open-angle glaucoma, bilateral, moderate stage: Secondary | ICD-10-CM | POA: Diagnosis not present

## 2024-06-24 DIAGNOSIS — E119 Type 2 diabetes mellitus without complications: Secondary | ICD-10-CM | POA: Diagnosis not present

## 2024-06-24 LAB — OPHTHALMOLOGY REPORT-SCANNED

## 2024-06-24 MED ORDER — METFORMIN HCL ER 500 MG PO TB24
1000.0000 mg | ORAL_TABLET | Freq: Two times a day (BID) | ORAL | 3 refills | Status: DC
  Filled 2024-06-24: qty 360, 90d supply, fill #0

## 2024-06-30 ENCOUNTER — Other Ambulatory Visit: Payer: Self-pay | Admitting: Family Medicine

## 2024-06-30 ENCOUNTER — Other Ambulatory Visit (HOSPITAL_BASED_OUTPATIENT_CLINIC_OR_DEPARTMENT_OTHER): Payer: Self-pay

## 2024-06-30 DIAGNOSIS — I10 Essential (primary) hypertension: Secondary | ICD-10-CM

## 2024-06-30 MED ORDER — BENAZEPRIL HCL 40 MG PO TABS
40.0000 mg | ORAL_TABLET | Freq: Every day | ORAL | 2 refills | Status: AC
  Filled 2024-06-30: qty 90, 90d supply, fill #0
  Filled 2024-10-13: qty 90, 90d supply, fill #1

## 2024-07-04 ENCOUNTER — Other Ambulatory Visit (HOSPITAL_BASED_OUTPATIENT_CLINIC_OR_DEPARTMENT_OTHER): Payer: Self-pay

## 2024-07-16 ENCOUNTER — Ambulatory Visit: Admitting: "Endocrinology

## 2024-07-24 ENCOUNTER — Other Ambulatory Visit (HOSPITAL_BASED_OUTPATIENT_CLINIC_OR_DEPARTMENT_OTHER): Payer: Self-pay

## 2024-07-30 ENCOUNTER — Other Ambulatory Visit (HOSPITAL_COMMUNITY): Payer: Self-pay

## 2024-07-30 ENCOUNTER — Other Ambulatory Visit: Payer: Self-pay | Admitting: Family Medicine

## 2024-07-30 ENCOUNTER — Other Ambulatory Visit

## 2024-07-30 ENCOUNTER — Encounter: Payer: Self-pay | Admitting: "Endocrinology

## 2024-07-30 ENCOUNTER — Ambulatory Visit: Admitting: "Endocrinology

## 2024-07-30 ENCOUNTER — Other Ambulatory Visit (HOSPITAL_BASED_OUTPATIENT_CLINIC_OR_DEPARTMENT_OTHER): Payer: Self-pay

## 2024-07-30 VITALS — BP 124/80 | HR 78 | Ht 68.0 in | Wt 159.0 lb

## 2024-07-30 DIAGNOSIS — Z7984 Long term (current) use of oral hypoglycemic drugs: Secondary | ICD-10-CM | POA: Diagnosis not present

## 2024-07-30 DIAGNOSIS — E1165 Type 2 diabetes mellitus with hyperglycemia: Secondary | ICD-10-CM

## 2024-07-30 DIAGNOSIS — E7800 Pure hypercholesterolemia, unspecified: Secondary | ICD-10-CM

## 2024-07-30 LAB — POCT GLYCOSYLATED HEMOGLOBIN (HGB A1C): Hemoglobin A1C: 7.6 % — AB (ref 4.0–5.6)

## 2024-07-30 MED ORDER — SYNJARDY XR 12.5-1000 MG PO TB24
2.0000 | ORAL_TABLET | Freq: Every day | ORAL | 1 refills | Status: AC
  Filled 2024-07-30: qty 180, 90d supply, fill #0
  Filled 2024-10-24 (×2): qty 180, 90d supply, fill #1

## 2024-07-30 MED ORDER — EMPAGLIFLOZIN 25 MG PO TABS
25.0000 mg | ORAL_TABLET | Freq: Every day | ORAL | 0 refills | Status: DC
  Filled 2024-07-30: qty 7, 7d supply, fill #0

## 2024-07-30 MED ORDER — FREESTYLE LIBRE 3 PLUS SENSOR MISC
3 refills | Status: AC
  Filled 2024-07-30: qty 6, 84d supply, fill #0
  Filled 2024-10-24 (×2): qty 6, 84d supply, fill #1

## 2024-07-30 MED ORDER — RYBELSUS 14 MG PO TABS
14.0000 mg | ORAL_TABLET | Freq: Every day | ORAL | 1 refills | Status: AC
  Filled 2024-07-30: qty 90, 90d supply, fill #0
  Filled 2024-10-24 (×2): qty 90, 90d supply, fill #1

## 2024-07-30 NOTE — Progress Notes (Signed)
 Outpatient Endocrinology Note Chad Birmingham, MD  07/30/24   Chad Morton 1955/11/05 [id number]  Referring Provider: Katrinka Garnette KIDD, MD Primary Care Provider: Katrinka Garnette KIDD, MD Reason for consultation: Subjective   Assessment & Plan  Diagnoses and all orders for this visit:  Uncontrolled type 2 diabetes mellitus with hyperglycemia (HCC) -     POCT glycosylated hemoglobin (Hb A1C) -     Microalbumin / creatinine urine ratio -     Semaglutide  (RYBELSUS ) 14 MG TABS; Take 1 tablet (14 mg total) by mouth daily. -     Empagliflozin -metFORMIN  HCl ER (SYNJARDY XR) 12.01-999 MG TB24; Take 2 tablets by mouth daily.  Long term (current) use of oral hypoglycemic drugs  Pure hypercholesterolemia  Other orders -     Continuous Glucose Sensor (FREESTYLE LIBRE 3 PLUS SENSOR) MISC; Change sensor every 15 days.   Diabetes Type II complicated by hyperglycemia,  Lab Results  Component Value Date   GFR 89.34 03/18/2024   Hba1c goal less than 7, current Hba1c is  Lab Results  Component Value Date   HGBA1C 7.6 (A) 07/30/2024   Will recommend the following: Stop Ozempic  0.5mg /week Stop Metformin  XR 500mg  2 pills bid Stop Jardiance  25 mg/day  Start Rybelsus  14 mg every day Synjardy 12.01/999 mg bid  Ordered libre 3 +  No known contraindications/side effects to any of above medications  -Last LD and Tg are as follows: Lab Results  Component Value Date   LDLCALC 44 03/18/2024    Lab Results  Component Value Date   TRIG 83.0 03/18/2024   -On rosuvastatin  20 mg QD -Follow low fat diet and exercise   -Blood pressure goal <140/90 - Microalbumin/creatinine goal is < 30 -Last MA/Cr is as follows: No results found for: MICROALBUR, MALB24HUR -on ACE/ARB benazapril 40 mg every day  -diet changes including salt restriction -limit eating outside -counseled BP targets per standards of diabetes care -uncontrolled blood pressure can lead to retinopathy, nephropathy and  cardiovascular and atherosclerotic heart disease  Reviewed and counseled on: -A1C target -Blood sugar targets -Complications of uncontrolled diabetes  -Checking blood sugar before meals and bedtime and bring log next visit -All medications with mechanism of action and side effects -Hypoglycemia management: rule of 15's, Glucagon Emergency Kit and medical alert ID -low-carb low-fat plate-method diet -At least 20 minutes of physical activity per day -Annual dilated retinal eye exam and foot exam -compliance and follow up needs -follow up as scheduled or earlier if problem gets worse  Call if blood sugar is less than 70 or consistently above 250    Take a 15 gm snack of carbohydrate at bedtime before you go to sleep if your blood sugar is less than 100.    If you are going to fast after midnight for a test or procedure, ask your physician for instructions on how to reduce/decrease your insulin  dose.    Call if blood sugar is less than 70 or consistently above 250  -Treating a low sugar by rule of 15  (15 gms of sugar every 15 min until sugar is more than 70) If you feel your sugar is low, test your sugar to be sure If your sugar is low (less than 70), then take 15 grams of a fast acting Carbohydrate (3-4 glucose tablets or glucose gel or 4 ounces of juice or regular soda) Recheck your sugar 15 min after treating low to make sure it is more than 70 If sugar is still less than  70, treat again with 15 grams of carbohydrate          Don't drive the hour of hypoglycemia  If unconscious/unable to eat or drink by mouth, use glucagon injection or nasal spray baqsimi and call 911. Can repeat again in 15 min if still unconscious.  Return in about 3 months (around 10/30/2024) for visit, labs today.   I have reviewed current medications, nurse's notes, allergies, vital signs, past medical and surgical history, family medical history, and social history for this encounter. Counseled patient on  symptoms, examination findings, lab findings, imaging results, treatment decisions and monitoring and prognosis. The patient understood the recommendations and agrees with the treatment plan. All questions regarding treatment plan were fully answered.  Chad Birmingham, MD  07/30/24   History of Present Illness [name] is a 68 y.o. year old male who presents for evaluation of Type II diabetes mellitus.  Chad Morton was first diagnosed around 2015. Diabetes education -  Home diabetes regimen: Ozempic  0.5mg /week-max tolerated dose due to weight loss  Metformin  XR 500mg  2 pills bid Jardiance  25 mg/day  COMPLICATIONS -  MI/Stroke -  retinopathy -  neuropathy -  nephropathy  SYMPTOMS REVIEWED - Polyuria - Weight loss - Blurred vision  BLOOD SUGAR DATA No data shared  Physical Exam  BP 124/80   Pulse 78   Ht 5' 8 (1.727 m)   Wt 159 lb (72.1 kg)   SpO2 99%   BMI 24.18 kg/m    Constitutional: well developed, well nourished Head: normocephalic, atraumatic Eyes: sclera anicteric, no redness Neck: supple Lungs: normal respiratory effort Neurology: alert and oriented Skin: dry, no appreciable rashes Musculoskeletal: no appreciable defects Psychiatric: normal mood and affect Diabetic Foot Exam - Simple   No data filed      Current Medications Patient's Medications  New Prescriptions   CONTINUOUS GLUCOSE SENSOR (FREESTYLE LIBRE 3 PLUS SENSOR) MISC    Change sensor every 15 days.   EMPAGLIFLOZIN -METFORMIN  HCL ER (SYNJARDY XR) 12.01-999 MG TB24    Take 2 tablets by mouth daily.   SEMAGLUTIDE  (RYBELSUS ) 14 MG TABS    Take 1 tablet (14 mg total) by mouth daily.  Previous Medications   ACCU-CHEK FASTCLIX LANCETS MISC    USE TO TEST BLOOD SUGAR ONCE DAILY. Dx: E11.9   BENAZEPRIL  (LOTENSIN ) 40 MG TABLET    Take 1 tablet (40 mg total) by mouth daily.   BLOOD GLUCOSE MONITORING SUPPL (BLOOD GLUCOSE MONITOR SYSTEM) W/DEVICE KIT    Test in the morning, at noon, and at  bedtime.   BRIMONIDINE  (ALPHAGAN ) 0.2 % OPHTHALMIC SOLUTION    Place 1 drop into the affected eye(s) 2 (two) times daily as directed.   CYANOCOBALAMIN  (VITAMIN B12) 1000 MCG TABLET    Take 1,000 mcg by mouth daily.   DORZOLAMIDE -TIMOLOL  (COSOPT ) 2-0.5 % OPHTHALMIC SOLUTION    Place 1 drop into the affected eye(s) twice daily.   GLUCOSE BLOOD TEST STRIP    use as directed to test in the morning, evening , and night   ROSUVASTATIN  (CRESTOR ) 20 MG TABLET    Take 1 tablet (20 mg total) by mouth daily.  Modified Medications   No medications on file  Discontinued Medications   EMPAGLIFLOZIN  (JARDIANCE ) 25 MG TABS TABLET    Take 1 tablet (25 mg total) by mouth daily before breakfast.   METFORMIN  (GLUCOPHAGE -XR) 500 MG 24 HR TABLET    Take 2 tablets (1,000 mg total) by mouth 2 (two) times daily.  SEMAGLUTIDE ,0.25 OR 0.5MG /DOS, (OZEMPIC , 0.25 OR 0.5 MG/DOSE,) 2 MG/1.5ML SOPN    Inject 0.5 mg into the skin once a week. Takes on Friday   SEMAGLUTIDE ,0.25 OR 0.5MG /DOS, (OZEMPIC , 0.25 OR 0.5 MG/DOSE,) 2 MG/3ML SOPN    Inject 0.5 mg into the skin once a week for 28 days. Ongoing prescription after starter dose    Allergies Allergies  Allergen Reactions   Diphtheria Toxoid Anaphylaxis and Swelling    T-Dap    Past Medical History Past Medical History:  Diagnosis Date   Arthritis    Blood transfusion without reported diagnosis    at birth    Diabetes mellitus without complication (HCC)    Glaucoma    [facility]   Hyperlipidemia    Hypertension    Post-operative nausea and vomiting     Past Surgical History Past Surgical History:  Procedure Laterality Date   CARPAL TUNNEL RELEASE Left 10/10/2018   Procedure: LEFT CARPAL TUNNEL RELEASE;  Surgeon: Vernetta Lonni GRADE, MD;  Location: [facility];  Service: Orthopedics;  Laterality: Left;   COLONOSCOPY  2010   COLONOSCOPY  2024   FRACTURE SURGERY Left    Pinky Finger in 2000s   INGUINAL HERNIA REPAIR Left 12/14/2023    Procedure: LEFT INGUINAL HERNIA REPAIR WITH MESH;  Surgeon: Ann Fine, MD;  Location: [facility] OR;  Service: General;  Laterality: Left;  LMA   KNEE ARTHROSCOPY W/ ACL RECONSTRUCTION Bilateral    both knees x1   MANDIBLE FRACTURE SURGERY Bilateral    TONSILECTOMY/ADENOIDECTOMY WITH MYRINGOTOMY     childhood    Family History family history includes Colon cancer in his father; Diabetes in his brother; Hyperlipidemia in his brother; Myasthenia gravis in his mother.  Social History Social History   Socioeconomic History   Marital status: Married    Spouse name: Not on file   Number of children: Not on file   Years of education: Not on file   Highest education level: Bachelor's degree (e.g., BA, AB, BS)  Occupational History   Not on file  Tobacco Use   Smoking status: Never   Smokeless tobacco: Never  Vaping Use   Vaping status: Never Used  Substance and Sexual Activity   Alcohol use: Yes    Alcohol/week: 5.0 standard drinks of alcohol    Types: 5 Standard drinks or equivalent per week   Drug use: No   Sexual activity: Yes  Other Topics Concern   Not on file  Social History Narrative   Married. 3 kids. 5 grandkids       Home remodeling. Wife works for [employer] for at least 2 more years in 2021. [employer]- geography major       Hobbies: time with family, beach, football (season tickets to [employer])   Social Drivers of Corporate Investment Banker Strain: Low Risk  (05/22/2024)   Overall Financial Resource Strain (CARDIA)    Difficulty of Paying Living Expenses: Not hard at all  Food Insecurity: No Food Insecurity (05/22/2024)   Hunger Vital Sign    Worried About Running Out of Food in the Last Year: Never true    Ran Out of Food in the Last Year: Never true  Transportation Needs: No Transportation Needs (05/22/2024)   PRAPARE - Administrator, Civil Service (Medical): No    Lack of Transportation (Non-Medical): No  Physical Activity: Sufficiently Active (05/22/2024)    Exercise Vital Sign    Days of Exercise per Week: 6 days  Minutes of Exercise per Session: 30 min  Stress: No Stress Concern Present (05/22/2024)   [employer] of Occupational Health - Occupational Stress Questionnaire    Feeling of Stress: Not at all  Social Connections: Moderately Integrated (05/22/2024)   Social Connection and Isolation Panel    Frequency of Communication with Friends and Family: More than three times a week    Frequency of Social Gatherings with Friends and Family: More than three times a week    Attends Religious Services: 1 to 4 times per year    Active Member of Clubs or Organizations: No    Attends Banker Meetings: Never    Marital Status: Married  Catering Manager Violence: Not At Risk (05/22/2024)   Humiliation, Afraid, Rape, and Kick questionnaire    Fear of Current or Ex-Partner: No    Emotionally Abused: No    Physically Abused: No    Sexually Abused: No    Lab Results  Component Value Date   HGBA1C 7.6 (A) 07/30/2024   HGBA1C 7.6 (H) 03/18/2024   HGBA1C 7.3 (H) 12/11/2023   Lab Results  Component Value Date   CHOL 138 03/18/2024   Lab Results  Component Value Date   HDL 77.70 03/18/2024   Lab Results  Component Value Date   LDLCALC 44 03/18/2024   Lab Results  Component Value Date   TRIG 83.0 03/18/2024   Lab Results  Component Value Date   CHOLHDL 2 03/18/2024   Lab Results  Component Value Date   CREATININE 0.86 03/18/2024   Lab Results  Component Value Date   GFR 89.34 03/18/2024   No results found for: MACKEY CURRENT    Component Value Date/Time   NA 136 03/18/2024 0950   K 4.3 03/18/2024 0950   CL 101 03/18/2024 0950   CO2 27 03/18/2024 0950   GLUCOSE 122 (H) 03/18/2024 0950   BUN 22 03/18/2024 0950   CREATININE 0.86 03/18/2024 0950   CREATININE 1.10 05/26/2020 0840   CALCIUM  9.4 03/18/2024 0950   PROT 6.5 03/18/2024 0950   ALBUMIN 4.4 03/18/2024 0950   AST 12 03/18/2024 0950    ALT 12 03/18/2024 0950   ALKPHOS 35 (L) 03/18/2024 0950   BILITOT 0.9 03/18/2024 0950   GFRNONAA >60 12/12/2023 0940   GFRNONAA 71 05/26/2020 0840   GFRAA 82 05/26/2020 0840      Latest Ref Rng & Units 03/18/2024    9:50 AM 12/12/2023    9:40 AM 09/14/2023    9:05 AM  BMP  Glucose 70 - 99 mg/dL 877  810  822   BUN 6 - 23 mg/dL 22  15  23    Creatinine 0.40 - 1.50 mg/dL 9.13  9.06  9.02   Sodium 135 - 145 mEq/L 136  134  136   Potassium 3.5 - 5.1 mEq/L 4.3  4.5  4.4   Chloride 96 - 112 mEq/L 101  101  101   CO2 19 - 32 mEq/L 27  23  25    Calcium  8.4 - 10.5 mg/dL 9.4  9.0  9.5        Component Value Date/Time   WBC 8.1 03/18/2024 0950   RBC 4.62 03/18/2024 0950   HGB 14.0 03/18/2024 0950   HCT 42.0 03/18/2024 0950   PLT 215.0 03/18/2024 0950   MCV 90.8 03/18/2024 0950   MCH 31.4 12/12/2023 0940   MCHC 33.5 03/18/2024 0950   RDW 13.0 03/18/2024 0950   LYMPHSABS 2.0 03/18/2024 0950  MONOABS 0.5 03/18/2024 0950   EOSABS 0.5 03/18/2024 0950   BASOSABS 0.0 03/18/2024 0950     Parts of this note may have been dictated using voice recognition software. There may be variances in spelling and vocabulary which are unintentional. Not all errors are proofread. Please notify the dino if any discrepancies are noted or if the meaning of any statement is not clear.

## 2024-07-30 NOTE — Patient Instructions (Signed)

## 2024-07-31 ENCOUNTER — Telehealth: Payer: Self-pay | Admitting: Pharmacy Technician

## 2024-07-31 ENCOUNTER — Other Ambulatory Visit (HOSPITAL_COMMUNITY): Payer: Self-pay

## 2024-07-31 LAB — MICROALBUMIN / CREATININE URINE RATIO
Creatinine, Urine: 43 mg/dL (ref 20–320)
Microalb, Ur: <0.2 mg/dL

## 2024-07-31 NOTE — Telephone Encounter (Signed)
 Pharmacy Patient Advocate Encounter  Received notification from [employer] that Prior Authorization for Rybelsus  14MG  tablets  has been APPROVED from 07/31/24 to 07/31/25. Ran test claim, Copay is $0.00. This test claim was processed through [facility]- copay amounts may vary at other pharmacies due to pharmacy/plan contracts, or as the patient moves through the different stages of their insurance plan.   PA #/Case ID/Reference #: [id number]

## 2024-07-31 NOTE — Telephone Encounter (Signed)
 Pharmacy Patient Advocate Encounter   Received notification from Onbase that prior authorization for Rybelsus  14MG  tablets  is required/requested.   Insurance verification completed.   The patient is insured through [facility] ADVANT[employer]/RX ADVANCE.   Per test claim: PA required; PA submitted to above mentioned insurance via Latent Key/confirmation #/EOC [facility] Status is pending

## 2024-08-04 ENCOUNTER — Other Ambulatory Visit (HOSPITAL_BASED_OUTPATIENT_CLINIC_OR_DEPARTMENT_OTHER): Payer: Self-pay

## 2024-08-07 ENCOUNTER — Other Ambulatory Visit (HOSPITAL_BASED_OUTPATIENT_CLINIC_OR_DEPARTMENT_OTHER): Payer: Self-pay

## 2024-08-07 ENCOUNTER — Other Ambulatory Visit: Payer: Self-pay

## 2024-08-28 ENCOUNTER — Other Ambulatory Visit: Payer: Self-pay

## 2024-08-28 ENCOUNTER — Other Ambulatory Visit (HOSPITAL_BASED_OUTPATIENT_CLINIC_OR_DEPARTMENT_OTHER): Payer: Self-pay

## 2024-09-01 ENCOUNTER — Other Ambulatory Visit (HOSPITAL_BASED_OUTPATIENT_CLINIC_OR_DEPARTMENT_OTHER): Payer: Self-pay

## 2024-09-01 MED ORDER — DORZOLAMIDE HCL-TIMOLOL MAL 2-0.5 % OP SOLN
1.0000 [drp] | Freq: Two times a day (BID) | OPHTHALMIC | 2 refills | Status: AC
  Filled 2024-09-01: qty 10, 50d supply, fill #0
  Filled 2024-09-22: qty 10, 100d supply, fill #0

## 2024-09-15 ENCOUNTER — Telehealth: Payer: Self-pay | Admitting: Pharmacist

## 2024-09-15 ENCOUNTER — Other Ambulatory Visit (HOSPITAL_BASED_OUTPATIENT_CLINIC_OR_DEPARTMENT_OTHER): Payer: Self-pay

## 2024-09-15 NOTE — Progress Notes (Signed)
 Pharmacy Quality Measure Review  This patient is appearing on a report for being at risk of failing the adherence measure for hypertension (ACEi/ARB) medications this calendar year.   Medication: benazepril  Last fill date: 04/18/2024 for 90 day supply per insurance reports.   Reviewed Epic records. Patient actually filled benazepril  at [facility] pharmacy on 07/04/2024 for 90 DS but it was processed as cash price.  Contacted [facility] to make sure they had his HTA plan info. They reprocess the 07/04/2024 claim and cost was $0  Notified Chad Morton that he has a refund at the pharmacy for $14.94.   Madelin Ray, PharmD Clinical Pharmacist [facility]  Population Health [phone]

## 2024-09-22 ENCOUNTER — Other Ambulatory Visit (HOSPITAL_BASED_OUTPATIENT_CLINIC_OR_DEPARTMENT_OTHER): Payer: Self-pay

## 2024-09-30 ENCOUNTER — Encounter: Payer: Self-pay | Admitting: Family Medicine

## 2024-09-30 ENCOUNTER — Ambulatory Visit: Admitting: Family Medicine

## 2024-09-30 VITALS — BP 104/68 | HR 86 | Temp 98.1°F | Ht 68.0 in | Wt 161.0 lb

## 2024-09-30 DIAGNOSIS — E785 Hyperlipidemia, unspecified: Secondary | ICD-10-CM

## 2024-09-30 DIAGNOSIS — Z7984 Long term (current) use of oral hypoglycemic drugs: Secondary | ICD-10-CM | POA: Diagnosis not present

## 2024-09-30 DIAGNOSIS — I10 Essential (primary) hypertension: Secondary | ICD-10-CM | POA: Diagnosis not present

## 2024-09-30 DIAGNOSIS — E1169 Type 2 diabetes mellitus with other specified complication: Secondary | ICD-10-CM

## 2024-09-30 NOTE — Patient Instructions (Addendum)
 I'm glad you are doing well overall and I thank Dr. Obadiah for getting your a1c looking like going in right direction!   Recommended follow up: Return in about 6 months (around 03/30/2025) for physical or sooner if needed.Schedule b4 you leave. After that point we may just go to annual as long as still seeing Dr. Komal

## 2024-09-30 NOTE — Progress Notes (Signed)
 " Phone [phone] In person visit   Subjective:   Chad Morton is a 69 y.o. 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    Priority: High   Type 2 diabetes mellitus without complication, without long-term current use of insulin  (HCC) 05/30/2017    Priority: High   B12 deficiency 11/25/2021    Priority: Medium    Hypertension, essential 10/09/2017    Priority: Medium    Hyperlipidemia associated with type 2 diabetes mellitus (HCC) 10/09/2017    Priority: Medium    Glaucoma     Priority: Medium    Status post carpal tunnel release 10/24/2018    Priority: Low   Carpal tunnel syndrome 07/09/2018    Priority: Low   Degenerative arthritis of right knee 01/16/2018    Priority: Low   Family history of colon cancer 10/09/2017    Priority: Low   Chronic pain of left knee 08/06/2023    Medications- reviewed and updated Current Outpatient Medications  Medication Sig Dispense Refill   Accu-Chek FastClix Lancets MISC USE TO TEST BLOOD SUGAR ONCE DAILY. Dx: E11.9 102 each 4   benazepril  (LOTENSIN ) 40 MG tablet Take 1 tablet (40 mg total) by mouth daily. 90 tablet 2   Blood Glucose Monitoring Suppl (BLOOD GLUCOSE MONITOR SYSTEM) w/Device KIT Test in the morning, at noon, and at bedtime. 1 kit 0   brimonidine  (ALPHAGAN ) 0.2 % ophthalmic solution Place 1 drop into the affected eye(s) 2 (two) times daily as directed. 15 mL 2   Continuous Glucose Sensor (FREESTYLE LIBRE 3 PLUS SENSOR) MISC Change sensor every 15 days. 6 each 3   cyanocobalamin  (VITAMIN B12) 1000 MCG tablet Take 1,000 mcg by mouth daily.     dorzolamide -timolol  (COSOPT ) 2-0.5 % ophthalmic solution Place 1  drop into the affected eye(s) 2 (two) times daily. 10 mL 2   Empagliflozin -metFORMIN  HCl ER (SYNJARDY  XR) 12.01-999 MG TB24 Take 2 tablets by mouth daily. 180 tablet 1   glucose blood test strip use as directed to test in the morning, evening , and night 100 each 3   rosuvastatin  (CRESTOR ) 20 MG tablet Take 1 tablet (20 mg total) by mouth daily. 90 tablet 3   Semaglutide  (RYBELSUS ) 14 MG TABS Take 1 tablet (14 mg total) by mouth daily. 90 tablet 1   Current Facility-Administered Medications  Medication Dose Route Frequency Provider Last Rate Last Admin   Insulin  Pen Needle (NOVOFINE) 30 each  3 packet Subcutaneous PRN Katrinka Garnette KIDD, MD         Objective:  BP 104/68 (BP Location: Left Arm, Patient Position: Sitting, Cuff Size: Normal)   Pulse 86   Temp 98.1 F (36.7 C) (Temporal)   Ht 5' 8 (1.727 m)   Wt 161 lb (73 kg)   SpO2 96%   BMI 24.48 kg/m  Gen: NAD, resting comfortably CV: RRR no murmurs rubs or gallops Lungs: CTAB no crackles, wheeze, rhonchi Ext: no edema Skin: warm, dry     Assessment and Plan   # Diabetes-peak A1c of 8.26 November 2019-now sees endocrinology Dr. Obadiah S: Medication: Synjardy  12.01-999 mg extended release-takes 2 tablets daily, Rybelsus  14 mg- tolerating ok- some  looser stools  Other trials in past -Prior jardiance  25 mg, Ozempic  0.5 mg (prior Tradjenta  prior to that. ), metformin  1000mg  twice daily ER,  -Prior glimepiride  but had hypoglycemia  CBGs- CONTINUOUS GLUCOSE MONITOR has been helpful- considering cutting creamer as seems to cause a spike. Projected a1c of 6.8 which would be big improvement. Did have once as low as 70 but came up on its own- he will monitor for lows Lab Results  Component Value Date   HGBA1C 7.6 (A) 07/30/2024   HGBA1C 7.6 (H) 03/18/2024   HGBA1C 7.3 (H) 12/11/2023  Exercise and diet- some exercise still and still working and active there. No other tweaks to diet.  A/P: diabetes appears to be improving- has follow up  for a1c next month- we are going of the change our cycle to annual starting after physical in 6 months as long as seeing Dr. Obadiah. Continue current medications for now. Thankful no significant lows- continue to avoid sulfonylureas  #hyperlipidemia S: Medication:rosuvastatin  20 mg Lab Results  Component Value Date   CHOL 138 03/18/2024   HDL 77.70 03/18/2024   LDLCALC 44 03/18/2024   LDLDIRECT 61.0 05/24/2021   TRIG 83.0 03/18/2024   CHOLHDL 2 03/18/2024   A/P: well controlled continue current medications - repeat next visit   #hypertension S: medication: benazepril  40mg  Home readings #s:118 on most recent home check  BP Readings from Last 3 Encounters:  09/30/24 104/68  07/30/24 124/80  03/18/24 110/70  A/P: blood pressure well controlled here and at home- continue current medications    #Glaucoma-follows closely with ophthalmology   Recommended follow up: Return in about 6 months (around 03/30/2025) for physical or sooner if needed.Schedule b4 you leave. Future Appointments  Date Time Provider Department Center  10/28/2024  8:00 AM Dartha Obadiah, MD [id number] None    Lab/Order associations:   ICD-10-CM   1. Hypertension, essential  I10     2. Hyperlipidemia associated with type 2 diabetes mellitus (HCC)  E11.69    E78.5       No orders of the defined types were placed in this encounter.   Return precautions advised.  Garnette Lukes, MD  "

## 2024-10-02 ENCOUNTER — Other Ambulatory Visit (HOSPITAL_BASED_OUTPATIENT_CLINIC_OR_DEPARTMENT_OTHER): Payer: Self-pay

## 2024-10-13 ENCOUNTER — Other Ambulatory Visit (HOSPITAL_BASED_OUTPATIENT_CLINIC_OR_DEPARTMENT_OTHER): Payer: Self-pay

## 2024-10-14 ENCOUNTER — Encounter: Payer: Self-pay | Admitting: Family Medicine

## 2024-10-16 ENCOUNTER — Ambulatory Visit: Admitting: Family Medicine

## 2024-10-16 ENCOUNTER — Encounter: Payer: Self-pay | Admitting: Family Medicine

## 2024-10-16 ENCOUNTER — Other Ambulatory Visit: Payer: Self-pay

## 2024-10-16 VITALS — BP 122/72 | HR 95 | Ht 68.0 in | Wt 162.0 lb

## 2024-10-16 DIAGNOSIS — M25562 Pain in left knee: Secondary | ICD-10-CM | POA: Diagnosis not present

## 2024-10-16 DIAGNOSIS — M1712 Unilateral primary osteoarthritis, left knee: Secondary | ICD-10-CM | POA: Diagnosis not present

## 2024-10-16 DIAGNOSIS — G8929 Other chronic pain: Secondary | ICD-10-CM

## 2024-10-16 NOTE — Patient Instructions (Addendum)
 Thank you for coming in today.   You received an injection today. Seek immediate medical attention if the joint becomes red, extremely painful, or is oozing fluid.   Check back as needed

## 2024-10-16 NOTE — Progress Notes (Signed)
"       ° °  I, Leotis Batter, CMA acting as a scribe for Artist Lloyd, MD.  Chad Morton is a 69 y.o. male who presents to [facility] today for exacerbation of his L knee pain. Pt was last seen for his L knee on 03/03/24 and it was injected w/ a steroid.   Today, pt reports exacerbation of sx while being at [facility] in November. Denies visible swelling. Popping with stairs. No meds currently for knee pain. Locates pain to medial aspect of the knee. Denies radiating pain.   Dx imaging: 08/06/23 L knee XR 03/05/18 L knee XR   Pertinent review of systems: No fevers or chills  Relevant historical information: Hypertension and diabetes.   Exam:  BP 122/72   Pulse 95   Ht 5' 8 (1.727 m)   Wt 162 lb (73.5 kg)   SpO2 97%   BMI 24.63 kg/m  General: Well Developed, well nourished, and in no acute distress.   MSK: Left knee mild effusion normal motion.    Lab and Radiology Results  Procedure: Real-time Ultrasound Guided Injection of left knee joint superior lateral patella space Device: Philips Affiniti 50G/GE Logiq Images permanently stored and available for review in PACS Verbal informed consent obtained.  Discussed risks and benefits of procedure. Warned about infection, bleeding, hyperglycemia damage to structures among others. Patient expresses understanding and agreement Time-out conducted.   Noted no overlying erythema, induration, or other signs of local infection.   Skin prepped in a sterile fashion.   Local anesthesia: Topical Ethyl chloride.   With sterile technique and under real time ultrasound guidance: 40 mg of Kenalog  and 2 mL of Marcaine  injected into knee joint. Fluid seen entering the joint capsule.   Completed without difficulty   Pain immediately resolved suggesting accurate placement of the medication.   Advised to call if fevers/chills, erythema, induration, drainage, or persistent bleeding.   Images permanently stored and available  for review in the ultrasound unit.  Impression: Technically successful ultrasound guided injection.   EXAM: LEFT KNEE 3 VIEWS   COMPARISON:  03/05/2018.   FINDINGS: No acute fracture, dislocation or subluxation. Postop changes from ACL repair. Medial and lateral compartment narrowing and osteophyte formation consistent with osteoarthritis. Small suprapatellar effusion. Superior patellar spur.   IMPRESSION: Degenerative changes. Small effusion.     Electronically Signed   By: Fonda Field M.D.   On: 08/22/2023 16:1     Assessment and Plan: 69 y.o. male with chronic left knee pain due to DJD.  Plan for steroid injection today.  Continue quad strengthening activity.  Recommend Voltaren  gel check back as needed.   PDMP not reviewed this encounter. Orders Placed This Encounter  Procedures   US  LIMITED JOINT SPACE STRUCTURES LOW LEFT(NO LINKED CHARGES)    Reason for Exam (SYMPTOM  OR DIAGNOSIS REQUIRED):   left knee pain    Preferred imaging location?:   [facility]   No orders of the defined types were placed in this encounter.    Discussed warning signs or symptoms. Please see discharge instructions. Patient expresses understanding.   The above documentation has been reviewed and is accurate and complete Artist Lloyd, M.D.   "

## 2024-10-24 ENCOUNTER — Other Ambulatory Visit (HOSPITAL_BASED_OUTPATIENT_CLINIC_OR_DEPARTMENT_OTHER): Payer: Self-pay

## 2024-10-24 ENCOUNTER — Other Ambulatory Visit: Payer: Self-pay

## 2024-10-28 ENCOUNTER — Ambulatory Visit: Admitting: "Endocrinology

## 2024-11-25 ENCOUNTER — Ambulatory Visit: Admitting: "Endocrinology

## 2025-03-31 ENCOUNTER — Encounter: Admitting: Family Medicine
